# Patient Record
Sex: Female | Born: 1935 | Race: White | Hispanic: No | Marital: Married | Smoking: Never smoker
Health system: Southern US, Community
[De-identification: ages and names within clinical notes are randomized; demographics above are authoritative.]

## PROBLEM LIST (undated history)

## (undated) DIAGNOSIS — M1990 Unspecified osteoarthritis, unspecified site: Secondary | ICD-10-CM

## (undated) DIAGNOSIS — I5032 Chronic diastolic (congestive) heart failure: Secondary | ICD-10-CM

## (undated) DIAGNOSIS — F411 Generalized anxiety disorder: Secondary | ICD-10-CM

## (undated) DIAGNOSIS — C801 Malignant (primary) neoplasm, unspecified: Secondary | ICD-10-CM

## (undated) DIAGNOSIS — I482 Chronic atrial fibrillation, unspecified: Secondary | ICD-10-CM

## (undated) DIAGNOSIS — Z973 Presence of spectacles and contact lenses: Secondary | ICD-10-CM

## (undated) DIAGNOSIS — E785 Hyperlipidemia, unspecified: Secondary | ICD-10-CM

## (undated) DIAGNOSIS — F418 Other specified anxiety disorders: Secondary | ICD-10-CM

## (undated) DIAGNOSIS — E039 Hypothyroidism, unspecified: Secondary | ICD-10-CM

## (undated) DIAGNOSIS — J383 Other diseases of vocal cords: Secondary | ICD-10-CM

## (undated) HISTORY — PX: HIP FRACTURE SURGERY: SHX118

## (undated) HISTORY — DX: Generalized anxiety disorder: F41.1

## (undated) HISTORY — PX: CATARACT EXTRACTION W/ INTRAOCULAR LENS  IMPLANT, BILATERAL: SHX1307

## (undated) HISTORY — PX: FRACTURE SURGERY: SHX138

## (undated) HISTORY — PX: COLONOSCOPY: SHX174

## (undated) HISTORY — PX: MULTIPLE TOOTH EXTRACTIONS: SHX2053

## (undated) HISTORY — DX: Hyperlipidemia, unspecified: E78.5

---

## 2011-02-14 DIAGNOSIS — F3132 Bipolar disorder, current episode depressed, moderate: Secondary | ICD-10-CM | POA: Diagnosis not present

## 2011-02-14 DIAGNOSIS — F320 Major depressive disorder, single episode, mild: Secondary | ICD-10-CM | POA: Diagnosis not present

## 2011-02-14 DIAGNOSIS — R5382 Chronic fatigue, unspecified: Secondary | ICD-10-CM | POA: Diagnosis not present

## 2011-02-14 DIAGNOSIS — F411 Generalized anxiety disorder: Secondary | ICD-10-CM | POA: Diagnosis not present

## 2011-05-13 DIAGNOSIS — R5381 Other malaise: Secondary | ICD-10-CM | POA: Diagnosis not present

## 2011-05-13 DIAGNOSIS — R5383 Other fatigue: Secondary | ICD-10-CM | POA: Diagnosis not present

## 2011-08-30 DIAGNOSIS — R5381 Other malaise: Secondary | ICD-10-CM | POA: Diagnosis not present

## 2011-08-30 DIAGNOSIS — R5383 Other fatigue: Secondary | ICD-10-CM | POA: Diagnosis not present

## 2011-08-30 DIAGNOSIS — R5382 Chronic fatigue, unspecified: Secondary | ICD-10-CM | POA: Diagnosis not present

## 2011-08-30 DIAGNOSIS — E569 Vitamin deficiency, unspecified: Secondary | ICD-10-CM | POA: Diagnosis not present

## 2011-10-20 DIAGNOSIS — Z961 Presence of intraocular lens: Secondary | ICD-10-CM | POA: Diagnosis not present

## 2011-11-15 DIAGNOSIS — J209 Acute bronchitis, unspecified: Secondary | ICD-10-CM | POA: Diagnosis not present

## 2011-11-28 DIAGNOSIS — Z1231 Encounter for screening mammogram for malignant neoplasm of breast: Secondary | ICD-10-CM | POA: Diagnosis not present

## 2012-02-28 DIAGNOSIS — M549 Dorsalgia, unspecified: Secondary | ICD-10-CM | POA: Diagnosis not present

## 2012-02-28 DIAGNOSIS — Z0000 Encounter for general adult medical examination without abnormal findings: Secondary | ICD-10-CM | POA: Diagnosis not present

## 2012-02-28 DIAGNOSIS — Z136 Encounter for screening for cardiovascular disorders: Secondary | ICD-10-CM | POA: Diagnosis not present

## 2012-03-01 DIAGNOSIS — Z23 Encounter for immunization: Secondary | ICD-10-CM | POA: Diagnosis not present

## 2012-04-03 DIAGNOSIS — Z1272 Encounter for screening for malignant neoplasm of vagina: Secondary | ICD-10-CM | POA: Diagnosis not present

## 2012-04-03 DIAGNOSIS — Z124 Encounter for screening for malignant neoplasm of cervix: Secondary | ICD-10-CM | POA: Diagnosis not present

## 2012-04-25 DIAGNOSIS — H16109 Unspecified superficial keratitis, unspecified eye: Secondary | ICD-10-CM | POA: Diagnosis not present

## 2012-05-09 DIAGNOSIS — H16229 Keratoconjunctivitis sicca, not specified as Sjogren's, unspecified eye: Secondary | ICD-10-CM | POA: Diagnosis not present

## 2012-05-30 DIAGNOSIS — M549 Dorsalgia, unspecified: Secondary | ICD-10-CM | POA: Diagnosis not present

## 2012-07-02 DIAGNOSIS — Z961 Presence of intraocular lens: Secondary | ICD-10-CM | POA: Diagnosis not present

## 2012-08-31 DIAGNOSIS — M549 Dorsalgia, unspecified: Secondary | ICD-10-CM | POA: Diagnosis not present

## 2012-11-06 DIAGNOSIS — R5381 Other malaise: Secondary | ICD-10-CM | POA: Diagnosis not present

## 2012-11-06 DIAGNOSIS — E782 Mixed hyperlipidemia: Secondary | ICD-10-CM | POA: Diagnosis not present

## 2012-11-06 DIAGNOSIS — I517 Cardiomegaly: Secondary | ICD-10-CM | POA: Diagnosis not present

## 2012-11-06 DIAGNOSIS — R05 Cough: Secondary | ICD-10-CM | POA: Diagnosis not present

## 2012-11-06 DIAGNOSIS — R059 Cough, unspecified: Secondary | ICD-10-CM | POA: Diagnosis not present

## 2012-11-28 DIAGNOSIS — Z803 Family history of malignant neoplasm of breast: Secondary | ICD-10-CM | POA: Diagnosis not present

## 2012-11-28 DIAGNOSIS — Z1231 Encounter for screening mammogram for malignant neoplasm of breast: Secondary | ICD-10-CM | POA: Diagnosis not present

## 2012-12-17 DIAGNOSIS — M810 Age-related osteoporosis without current pathological fracture: Secondary | ICD-10-CM | POA: Diagnosis not present

## 2013-01-17 DIAGNOSIS — N950 Postmenopausal bleeding: Secondary | ICD-10-CM | POA: Diagnosis not present

## 2013-02-12 DIAGNOSIS — M129 Arthropathy, unspecified: Secondary | ICD-10-CM | POA: Diagnosis not present

## 2013-02-12 DIAGNOSIS — F411 Generalized anxiety disorder: Secondary | ICD-10-CM | POA: Diagnosis not present

## 2013-02-12 DIAGNOSIS — Z79899 Other long term (current) drug therapy: Secondary | ICD-10-CM | POA: Diagnosis not present

## 2013-02-12 DIAGNOSIS — N950 Postmenopausal bleeding: Secondary | ICD-10-CM | POA: Diagnosis not present

## 2013-02-12 DIAGNOSIS — Z803 Family history of malignant neoplasm of breast: Secondary | ICD-10-CM | POA: Diagnosis not present

## 2013-02-12 DIAGNOSIS — N882 Stricture and stenosis of cervix uteri: Secondary | ICD-10-CM | POA: Diagnosis not present

## 2013-02-12 DIAGNOSIS — N857 Hematometra: Secondary | ICD-10-CM | POA: Diagnosis not present

## 2013-02-12 DIAGNOSIS — Z800 Family history of malignant neoplasm of digestive organs: Secondary | ICD-10-CM | POA: Diagnosis not present

## 2013-02-15 DIAGNOSIS — R5381 Other malaise: Secondary | ICD-10-CM | POA: Diagnosis not present

## 2013-02-15 DIAGNOSIS — R5383 Other fatigue: Secondary | ICD-10-CM | POA: Diagnosis not present

## 2013-05-13 DIAGNOSIS — R5381 Other malaise: Secondary | ICD-10-CM | POA: Diagnosis not present

## 2013-05-13 DIAGNOSIS — R5382 Chronic fatigue, unspecified: Secondary | ICD-10-CM | POA: Diagnosis not present

## 2013-05-13 DIAGNOSIS — Z0000 Encounter for general adult medical examination without abnormal findings: Secondary | ICD-10-CM | POA: Diagnosis not present

## 2013-05-13 DIAGNOSIS — R5383 Other fatigue: Secondary | ICD-10-CM | POA: Diagnosis not present

## 2013-05-13 DIAGNOSIS — G9332 Myalgic encephalomyelitis/chronic fatigue syndrome: Secondary | ICD-10-CM | POA: Diagnosis not present

## 2013-05-15 DIAGNOSIS — R5383 Other fatigue: Secondary | ICD-10-CM | POA: Diagnosis not present

## 2013-05-15 DIAGNOSIS — R5381 Other malaise: Secondary | ICD-10-CM | POA: Diagnosis not present

## 2013-06-17 DIAGNOSIS — Z0000 Encounter for general adult medical examination without abnormal findings: Secondary | ICD-10-CM | POA: Diagnosis not present

## 2013-06-18 DIAGNOSIS — Z961 Presence of intraocular lens: Secondary | ICD-10-CM | POA: Diagnosis not present

## 2013-09-17 DIAGNOSIS — N859 Noninflammatory disorder of uterus, unspecified: Secondary | ICD-10-CM | POA: Diagnosis not present

## 2013-09-23 DIAGNOSIS — R109 Unspecified abdominal pain: Secondary | ICD-10-CM | POA: Diagnosis not present

## 2013-09-23 DIAGNOSIS — N859 Noninflammatory disorder of uterus, unspecified: Secondary | ICD-10-CM | POA: Diagnosis not present

## 2013-10-14 DIAGNOSIS — R1030 Lower abdominal pain, unspecified: Secondary | ICD-10-CM | POA: Diagnosis not present

## 2013-10-16 ENCOUNTER — Encounter: Payer: Self-pay | Admitting: Cardiovascular Disease

## 2013-10-16 DIAGNOSIS — N839 Noninflammatory disorder of ovary, fallopian tube and broad ligament, unspecified: Secondary | ICD-10-CM | POA: Diagnosis not present

## 2013-10-16 DIAGNOSIS — Z7982 Long term (current) use of aspirin: Secondary | ICD-10-CM | POA: Diagnosis not present

## 2013-10-16 DIAGNOSIS — F419 Anxiety disorder, unspecified: Secondary | ICD-10-CM | POA: Diagnosis not present

## 2013-10-16 DIAGNOSIS — R112 Nausea with vomiting, unspecified: Secondary | ICD-10-CM | POA: Diagnosis not present

## 2013-10-16 DIAGNOSIS — K5790 Diverticulosis of intestine, part unspecified, without perforation or abscess without bleeding: Secondary | ICD-10-CM | POA: Diagnosis not present

## 2013-10-16 DIAGNOSIS — Z780 Asymptomatic menopausal state: Secondary | ICD-10-CM | POA: Diagnosis not present

## 2013-10-16 DIAGNOSIS — K5732 Diverticulitis of large intestine without perforation or abscess without bleeding: Secondary | ICD-10-CM | POA: Diagnosis not present

## 2013-10-16 DIAGNOSIS — Z79899 Other long term (current) drug therapy: Secondary | ICD-10-CM | POA: Diagnosis not present

## 2013-10-16 DIAGNOSIS — K630 Abscess of intestine: Secondary | ICD-10-CM | POA: Diagnosis not present

## 2013-10-16 DIAGNOSIS — Z87828 Personal history of other (healed) physical injury and trauma: Secondary | ICD-10-CM | POA: Diagnosis not present

## 2013-10-16 DIAGNOSIS — Z800 Family history of malignant neoplasm of digestive organs: Secondary | ICD-10-CM | POA: Diagnosis not present

## 2013-10-16 DIAGNOSIS — K651 Peritoneal abscess: Secondary | ICD-10-CM | POA: Diagnosis not present

## 2013-10-16 DIAGNOSIS — Z803 Family history of malignant neoplasm of breast: Secondary | ICD-10-CM | POA: Diagnosis not present

## 2013-10-16 DIAGNOSIS — R32 Unspecified urinary incontinence: Secondary | ICD-10-CM | POA: Diagnosis not present

## 2013-10-16 DIAGNOSIS — Z23 Encounter for immunization: Secondary | ICD-10-CM | POA: Diagnosis not present

## 2013-10-16 DIAGNOSIS — I517 Cardiomegaly: Secondary | ICD-10-CM | POA: Diagnosis not present

## 2013-10-16 DIAGNOSIS — R1032 Left lower quadrant pain: Secondary | ICD-10-CM | POA: Diagnosis not present

## 2013-10-16 DIAGNOSIS — R109 Unspecified abdominal pain: Secondary | ICD-10-CM | POA: Diagnosis not present

## 2013-10-16 DIAGNOSIS — K769 Liver disease, unspecified: Secondary | ICD-10-CM | POA: Diagnosis not present

## 2013-10-16 DIAGNOSIS — N739 Female pelvic inflammatory disease, unspecified: Secondary | ICD-10-CM | POA: Diagnosis not present

## 2013-10-16 DIAGNOSIS — K5720 Diverticulitis of large intestine with perforation and abscess without bleeding: Secondary | ICD-10-CM | POA: Diagnosis not present

## 2013-10-21 ENCOUNTER — Encounter: Payer: Self-pay | Admitting: Cardiovascular Disease

## 2013-10-22 ENCOUNTER — Encounter: Payer: Self-pay | Admitting: Cardiovascular Disease

## 2013-10-22 DIAGNOSIS — Z7982 Long term (current) use of aspirin: Secondary | ICD-10-CM | POA: Diagnosis not present

## 2013-10-22 DIAGNOSIS — Z23 Encounter for immunization: Secondary | ICD-10-CM | POA: Diagnosis not present

## 2013-10-22 DIAGNOSIS — N739 Female pelvic inflammatory disease, unspecified: Secondary | ICD-10-CM | POA: Diagnosis not present

## 2013-10-22 DIAGNOSIS — K651 Peritoneal abscess: Secondary | ICD-10-CM | POA: Diagnosis not present

## 2013-10-22 DIAGNOSIS — K5780 Diverticulitis of intestine, part unspecified, with perforation and abscess without bleeding: Secondary | ICD-10-CM | POA: Diagnosis not present

## 2013-10-22 DIAGNOSIS — F419 Anxiety disorder, unspecified: Secondary | ICD-10-CM | POA: Diagnosis not present

## 2013-10-22 DIAGNOSIS — R109 Unspecified abdominal pain: Secondary | ICD-10-CM | POA: Diagnosis not present

## 2013-10-22 DIAGNOSIS — R32 Unspecified urinary incontinence: Secondary | ICD-10-CM | POA: Diagnosis not present

## 2013-10-22 DIAGNOSIS — N839 Noninflammatory disorder of ovary, fallopian tube and broad ligament, unspecified: Secondary | ICD-10-CM | POA: Diagnosis not present

## 2013-10-22 DIAGNOSIS — K5720 Diverticulitis of large intestine with perforation and abscess without bleeding: Secondary | ICD-10-CM | POA: Diagnosis not present

## 2013-10-22 DIAGNOSIS — K5732 Diverticulitis of large intestine without perforation or abscess without bleeding: Secondary | ICD-10-CM | POA: Diagnosis not present

## 2013-10-23 DIAGNOSIS — K5720 Diverticulitis of large intestine with perforation and abscess without bleeding: Secondary | ICD-10-CM | POA: Diagnosis not present

## 2013-10-24 DIAGNOSIS — K5780 Diverticulitis of intestine, part unspecified, with perforation and abscess without bleeding: Secondary | ICD-10-CM | POA: Diagnosis not present

## 2013-10-24 DIAGNOSIS — K5720 Diverticulitis of large intestine with perforation and abscess without bleeding: Secondary | ICD-10-CM | POA: Diagnosis not present

## 2013-10-25 DIAGNOSIS — N739 Female pelvic inflammatory disease, unspecified: Secondary | ICD-10-CM | POA: Diagnosis not present

## 2013-10-25 DIAGNOSIS — K5780 Diverticulitis of intestine, part unspecified, with perforation and abscess without bleeding: Secondary | ICD-10-CM | POA: Diagnosis not present

## 2013-11-04 DIAGNOSIS — N732 Unspecified parametritis and pelvic cellulitis: Secondary | ICD-10-CM | POA: Diagnosis not present

## 2013-11-04 DIAGNOSIS — N739 Female pelvic inflammatory disease, unspecified: Secondary | ICD-10-CM | POA: Diagnosis not present

## 2013-11-04 DIAGNOSIS — Z4803 Encounter for change or removal of drains: Secondary | ICD-10-CM | POA: Diagnosis not present

## 2013-11-12 DIAGNOSIS — N950 Postmenopausal bleeding: Secondary | ICD-10-CM | POA: Diagnosis not present

## 2013-11-12 DIAGNOSIS — N732 Unspecified parametritis and pelvic cellulitis: Secondary | ICD-10-CM | POA: Diagnosis not present

## 2013-11-12 DIAGNOSIS — Z79899 Other long term (current) drug therapy: Secondary | ICD-10-CM | POA: Diagnosis not present

## 2013-11-12 DIAGNOSIS — K5720 Diverticulitis of large intestine with perforation and abscess without bleeding: Secondary | ICD-10-CM | POA: Diagnosis not present

## 2013-11-12 DIAGNOSIS — K5780 Diverticulitis of intestine, part unspecified, with perforation and abscess without bleeding: Secondary | ICD-10-CM | POA: Diagnosis not present

## 2013-11-12 DIAGNOSIS — Z7982 Long term (current) use of aspirin: Secondary | ICD-10-CM | POA: Diagnosis not present

## 2013-11-16 ENCOUNTER — Encounter: Payer: Self-pay | Admitting: Cardiovascular Disease

## 2013-11-16 DIAGNOSIS — A480 Gas gangrene: Secondary | ICD-10-CM | POA: Diagnosis not present

## 2013-11-16 DIAGNOSIS — Z800 Family history of malignant neoplasm of digestive organs: Secondary | ICD-10-CM | POA: Diagnosis not present

## 2013-11-16 DIAGNOSIS — K5792 Diverticulitis of intestine, part unspecified, without perforation or abscess without bleeding: Secondary | ICD-10-CM | POA: Diagnosis not present

## 2013-11-16 DIAGNOSIS — K630 Abscess of intestine: Secondary | ICD-10-CM | POA: Diagnosis not present

## 2013-11-16 DIAGNOSIS — N950 Postmenopausal bleeding: Secondary | ICD-10-CM | POA: Diagnosis not present

## 2013-11-16 DIAGNOSIS — Z9989 Dependence on other enabling machines and devices: Secondary | ICD-10-CM | POA: Diagnosis not present

## 2013-11-16 DIAGNOSIS — N732 Unspecified parametritis and pelvic cellulitis: Secondary | ICD-10-CM | POA: Diagnosis not present

## 2013-11-16 DIAGNOSIS — K5780 Diverticulitis of intestine, part unspecified, with perforation and abscess without bleeding: Secondary | ICD-10-CM | POA: Diagnosis not present

## 2013-11-16 DIAGNOSIS — G4710 Hypersomnia, unspecified: Secondary | ICD-10-CM | POA: Diagnosis not present

## 2013-11-16 DIAGNOSIS — Z01818 Encounter for other preprocedural examination: Secondary | ICD-10-CM | POA: Diagnosis not present

## 2013-11-16 DIAGNOSIS — R1032 Left lower quadrant pain: Secondary | ICD-10-CM | POA: Diagnosis not present

## 2013-11-16 DIAGNOSIS — N9489 Other specified conditions associated with female genital organs and menstrual cycle: Secondary | ICD-10-CM | POA: Diagnosis not present

## 2013-11-16 DIAGNOSIS — F419 Anxiety disorder, unspecified: Secondary | ICD-10-CM | POA: Diagnosis not present

## 2013-11-16 DIAGNOSIS — N739 Female pelvic inflammatory disease, unspecified: Secondary | ICD-10-CM | POA: Diagnosis not present

## 2013-11-16 DIAGNOSIS — Z7982 Long term (current) use of aspirin: Secondary | ICD-10-CM | POA: Diagnosis not present

## 2013-11-16 DIAGNOSIS — K6389 Other specified diseases of intestine: Secondary | ICD-10-CM | POA: Diagnosis not present

## 2013-11-16 DIAGNOSIS — I4891 Unspecified atrial fibrillation: Secondary | ICD-10-CM | POA: Diagnosis not present

## 2013-11-16 DIAGNOSIS — R109 Unspecified abdominal pain: Secondary | ICD-10-CM | POA: Diagnosis not present

## 2013-11-16 DIAGNOSIS — C786 Secondary malignant neoplasm of retroperitoneum and peritoneum: Secondary | ICD-10-CM | POA: Diagnosis not present

## 2013-11-16 DIAGNOSIS — C569 Malignant neoplasm of unspecified ovary: Secondary | ICD-10-CM | POA: Diagnosis not present

## 2013-11-16 DIAGNOSIS — C562 Malignant neoplasm of left ovary: Secondary | ICD-10-CM | POA: Diagnosis not present

## 2013-11-16 DIAGNOSIS — K5720 Diverticulitis of large intestine with perforation and abscess without bleeding: Secondary | ICD-10-CM | POA: Diagnosis not present

## 2013-11-16 DIAGNOSIS — M1990 Unspecified osteoarthritis, unspecified site: Secondary | ICD-10-CM | POA: Diagnosis present

## 2013-11-16 DIAGNOSIS — R279 Unspecified lack of coordination: Secondary | ICD-10-CM | POA: Diagnosis not present

## 2013-11-16 DIAGNOSIS — Z79899 Other long term (current) drug therapy: Secondary | ICD-10-CM | POA: Diagnosis not present

## 2013-11-16 DIAGNOSIS — Z803 Family history of malignant neoplasm of breast: Secondary | ICD-10-CM | POA: Diagnosis not present

## 2013-11-16 DIAGNOSIS — K668 Other specified disorders of peritoneum: Secondary | ICD-10-CM | POA: Diagnosis not present

## 2013-12-03 DIAGNOSIS — I10 Essential (primary) hypertension: Secondary | ICD-10-CM | POA: Diagnosis not present

## 2013-12-03 DIAGNOSIS — C569 Malignant neoplasm of unspecified ovary: Secondary | ICD-10-CM | POA: Diagnosis not present

## 2013-12-03 DIAGNOSIS — I4891 Unspecified atrial fibrillation: Secondary | ICD-10-CM | POA: Diagnosis not present

## 2013-12-03 DIAGNOSIS — N135 Crossing vessel and stricture of ureter without hydronephrosis: Secondary | ICD-10-CM | POA: Diagnosis not present

## 2013-12-03 DIAGNOSIS — R262 Difficulty in walking, not elsewhere classified: Secondary | ICD-10-CM | POA: Diagnosis not present

## 2013-12-03 DIAGNOSIS — R531 Weakness: Secondary | ICD-10-CM | POA: Diagnosis not present

## 2013-12-03 DIAGNOSIS — I481 Persistent atrial fibrillation: Secondary | ICD-10-CM | POA: Diagnosis not present

## 2013-12-03 DIAGNOSIS — Z7401 Bed confinement status: Secondary | ICD-10-CM | POA: Diagnosis not present

## 2013-12-03 DIAGNOSIS — A047 Enterocolitis due to Clostridium difficile: Secondary | ICD-10-CM | POA: Diagnosis not present

## 2013-12-03 DIAGNOSIS — K660 Peritoneal adhesions (postprocedural) (postinfection): Secondary | ICD-10-CM | POA: Diagnosis present

## 2013-12-03 DIAGNOSIS — D62 Acute posthemorrhagic anemia: Secondary | ICD-10-CM | POA: Diagnosis not present

## 2013-12-03 DIAGNOSIS — Z48815 Encounter for surgical aftercare following surgery on the digestive system: Secondary | ICD-10-CM | POA: Diagnosis not present

## 2013-12-03 DIAGNOSIS — C561 Malignant neoplasm of right ovary: Secondary | ICD-10-CM | POA: Diagnosis not present

## 2013-12-03 DIAGNOSIS — R5381 Other malaise: Secondary | ICD-10-CM | POA: Diagnosis not present

## 2013-12-03 DIAGNOSIS — M1990 Unspecified osteoarthritis, unspecified site: Secondary | ICD-10-CM | POA: Diagnosis present

## 2013-12-03 DIAGNOSIS — F419 Anxiety disorder, unspecified: Secondary | ICD-10-CM | POA: Diagnosis present

## 2013-12-03 DIAGNOSIS — R918 Other nonspecific abnormal finding of lung field: Secondary | ICD-10-CM | POA: Diagnosis not present

## 2013-12-03 DIAGNOSIS — R1900 Intra-abdominal and pelvic swelling, mass and lump, unspecified site: Secondary | ICD-10-CM | POA: Diagnosis not present

## 2013-12-03 DIAGNOSIS — N738 Other specified female pelvic inflammatory diseases: Secondary | ICD-10-CM | POA: Diagnosis not present

## 2013-12-03 DIAGNOSIS — G8918 Other acute postprocedural pain: Secondary | ICD-10-CM | POA: Diagnosis not present

## 2013-12-03 DIAGNOSIS — K649 Unspecified hemorrhoids: Secondary | ICD-10-CM | POA: Diagnosis not present

## 2013-12-03 DIAGNOSIS — Z48816 Encounter for surgical aftercare following surgery on the genitourinary system: Secondary | ICD-10-CM | POA: Diagnosis not present

## 2013-12-03 DIAGNOSIS — C763 Malignant neoplasm of pelvis: Secondary | ICD-10-CM | POA: Diagnosis present

## 2013-12-03 DIAGNOSIS — K5720 Diverticulitis of large intestine with perforation and abscess without bleeding: Secondary | ICD-10-CM | POA: Diagnosis not present

## 2013-12-03 DIAGNOSIS — M6281 Muscle weakness (generalized): Secondary | ICD-10-CM | POA: Diagnosis not present

## 2013-12-03 DIAGNOSIS — C5701 Malignant neoplasm of right fallopian tube: Secondary | ICD-10-CM | POA: Diagnosis not present

## 2013-12-03 DIAGNOSIS — C562 Malignant neoplasm of left ovary: Secondary | ICD-10-CM | POA: Diagnosis not present

## 2013-12-03 DIAGNOSIS — Z9889 Other specified postprocedural states: Secondary | ICD-10-CM | POA: Diagnosis not present

## 2013-12-03 DIAGNOSIS — C786 Secondary malignant neoplasm of retroperitoneum and peritoneum: Secondary | ICD-10-CM | POA: Diagnosis not present

## 2013-12-03 DIAGNOSIS — K5900 Constipation, unspecified: Secondary | ICD-10-CM | POA: Diagnosis not present

## 2013-12-03 DIAGNOSIS — I272 Other secondary pulmonary hypertension: Secondary | ICD-10-CM | POA: Diagnosis present

## 2013-12-03 DIAGNOSIS — R109 Unspecified abdominal pain: Secondary | ICD-10-CM | POA: Diagnosis not present

## 2013-12-03 DIAGNOSIS — Z483 Aftercare following surgery for neoplasm: Secondary | ICD-10-CM | POA: Diagnosis not present

## 2013-12-03 HISTORY — PX: TOTAL ABDOMINAL HYSTERECTOMY: SHX209

## 2013-12-03 HISTORY — PX: COLECTOMY: SHX59

## 2013-12-14 DIAGNOSIS — R109 Unspecified abdominal pain: Secondary | ICD-10-CM | POA: Diagnosis not present

## 2013-12-14 DIAGNOSIS — L299 Pruritus, unspecified: Secondary | ICD-10-CM | POA: Diagnosis not present

## 2013-12-14 DIAGNOSIS — K5901 Slow transit constipation: Secondary | ICD-10-CM | POA: Diagnosis not present

## 2013-12-14 DIAGNOSIS — Z48816 Encounter for surgical aftercare following surgery on the genitourinary system: Secondary | ICD-10-CM | POA: Diagnosis not present

## 2013-12-14 DIAGNOSIS — M1990 Unspecified osteoarthritis, unspecified site: Secondary | ICD-10-CM | POA: Diagnosis not present

## 2013-12-14 DIAGNOSIS — Z7401 Bed confinement status: Secondary | ICD-10-CM | POA: Diagnosis not present

## 2013-12-14 DIAGNOSIS — Z90722 Acquired absence of ovaries, bilateral: Secondary | ICD-10-CM | POA: Diagnosis not present

## 2013-12-14 DIAGNOSIS — M6281 Muscle weakness (generalized): Secondary | ICD-10-CM | POA: Diagnosis not present

## 2013-12-14 DIAGNOSIS — D010 Carcinoma in situ of colon: Secondary | ICD-10-CM | POA: Diagnosis not present

## 2013-12-14 DIAGNOSIS — R21 Rash and other nonspecific skin eruption: Secondary | ICD-10-CM | POA: Diagnosis not present

## 2013-12-14 DIAGNOSIS — N738 Other specified female pelvic inflammatory diseases: Secondary | ICD-10-CM | POA: Diagnosis not present

## 2013-12-14 DIAGNOSIS — Z08 Encounter for follow-up examination after completed treatment for malignant neoplasm: Secondary | ICD-10-CM | POA: Diagnosis not present

## 2013-12-14 DIAGNOSIS — Z90710 Acquired absence of both cervix and uterus: Secondary | ICD-10-CM | POA: Diagnosis not present

## 2013-12-14 DIAGNOSIS — K5900 Constipation, unspecified: Secondary | ICD-10-CM | POA: Diagnosis not present

## 2013-12-14 DIAGNOSIS — A047 Enterocolitis due to Clostridium difficile: Secondary | ICD-10-CM | POA: Diagnosis not present

## 2013-12-14 DIAGNOSIS — R1032 Left lower quadrant pain: Secondary | ICD-10-CM | POA: Diagnosis not present

## 2013-12-14 DIAGNOSIS — K5720 Diverticulitis of large intestine with perforation and abscess without bleeding: Secondary | ICD-10-CM | POA: Diagnosis not present

## 2013-12-14 DIAGNOSIS — R531 Weakness: Secondary | ICD-10-CM | POA: Diagnosis not present

## 2013-12-14 DIAGNOSIS — Z483 Aftercare following surgery for neoplasm: Secondary | ICD-10-CM | POA: Diagnosis not present

## 2013-12-14 DIAGNOSIS — F419 Anxiety disorder, unspecified: Secondary | ICD-10-CM | POA: Diagnosis not present

## 2013-12-14 DIAGNOSIS — F411 Generalized anxiety disorder: Secondary | ICD-10-CM | POA: Diagnosis not present

## 2013-12-14 DIAGNOSIS — R262 Difficulty in walking, not elsewhere classified: Secondary | ICD-10-CM | POA: Diagnosis not present

## 2013-12-14 DIAGNOSIS — R5381 Other malaise: Secondary | ICD-10-CM | POA: Diagnosis not present

## 2013-12-14 DIAGNOSIS — I10 Essential (primary) hypertension: Secondary | ICD-10-CM | POA: Diagnosis not present

## 2013-12-14 DIAGNOSIS — Z48815 Encounter for surgical aftercare following surgery on the digestive system: Secondary | ICD-10-CM | POA: Diagnosis not present

## 2013-12-14 DIAGNOSIS — K649 Unspecified hemorrhoids: Secondary | ICD-10-CM | POA: Diagnosis not present

## 2013-12-14 DIAGNOSIS — C5701 Malignant neoplasm of right fallopian tube: Secondary | ICD-10-CM | POA: Diagnosis not present

## 2013-12-14 DIAGNOSIS — C19 Malignant neoplasm of rectosigmoid junction: Secondary | ICD-10-CM | POA: Diagnosis not present

## 2013-12-14 DIAGNOSIS — I4891 Unspecified atrial fibrillation: Secondary | ICD-10-CM | POA: Diagnosis not present

## 2013-12-14 DIAGNOSIS — C763 Malignant neoplasm of pelvis: Secondary | ICD-10-CM | POA: Diagnosis not present

## 2013-12-14 DIAGNOSIS — R1031 Right lower quadrant pain: Secondary | ICD-10-CM | POA: Diagnosis not present

## 2013-12-14 DIAGNOSIS — L853 Xerosis cutis: Secondary | ICD-10-CM | POA: Diagnosis not present

## 2013-12-17 DIAGNOSIS — I10 Essential (primary) hypertension: Secondary | ICD-10-CM | POA: Diagnosis not present

## 2013-12-17 DIAGNOSIS — A047 Enterocolitis due to Clostridium difficile: Secondary | ICD-10-CM | POA: Diagnosis not present

## 2013-12-17 DIAGNOSIS — I4891 Unspecified atrial fibrillation: Secondary | ICD-10-CM | POA: Diagnosis not present

## 2013-12-17 DIAGNOSIS — C763 Malignant neoplasm of pelvis: Secondary | ICD-10-CM | POA: Diagnosis not present

## 2013-12-18 DIAGNOSIS — C763 Malignant neoplasm of pelvis: Secondary | ICD-10-CM | POA: Diagnosis not present

## 2013-12-18 DIAGNOSIS — I10 Essential (primary) hypertension: Secondary | ICD-10-CM | POA: Diagnosis not present

## 2013-12-18 DIAGNOSIS — R5381 Other malaise: Secondary | ICD-10-CM | POA: Diagnosis not present

## 2013-12-18 DIAGNOSIS — I4891 Unspecified atrial fibrillation: Secondary | ICD-10-CM | POA: Diagnosis not present

## 2013-12-20 DIAGNOSIS — C763 Malignant neoplasm of pelvis: Secondary | ICD-10-CM | POA: Diagnosis not present

## 2013-12-20 DIAGNOSIS — K649 Unspecified hemorrhoids: Secondary | ICD-10-CM | POA: Diagnosis not present

## 2013-12-20 DIAGNOSIS — K5900 Constipation, unspecified: Secondary | ICD-10-CM | POA: Diagnosis not present

## 2013-12-20 DIAGNOSIS — F411 Generalized anxiety disorder: Secondary | ICD-10-CM | POA: Diagnosis not present

## 2013-12-25 DIAGNOSIS — R1032 Left lower quadrant pain: Secondary | ICD-10-CM | POA: Diagnosis not present

## 2013-12-25 DIAGNOSIS — C763 Malignant neoplasm of pelvis: Secondary | ICD-10-CM | POA: Diagnosis not present

## 2013-12-25 DIAGNOSIS — K5901 Slow transit constipation: Secondary | ICD-10-CM | POA: Diagnosis not present

## 2013-12-25 DIAGNOSIS — R1031 Right lower quadrant pain: Secondary | ICD-10-CM | POA: Diagnosis not present

## 2014-01-01 DIAGNOSIS — R21 Rash and other nonspecific skin eruption: Secondary | ICD-10-CM | POA: Diagnosis not present

## 2014-01-01 DIAGNOSIS — L853 Xerosis cutis: Secondary | ICD-10-CM | POA: Diagnosis not present

## 2014-01-01 DIAGNOSIS — L299 Pruritus, unspecified: Secondary | ICD-10-CM | POA: Diagnosis not present

## 2014-01-06 DIAGNOSIS — C5701 Malignant neoplasm of right fallopian tube: Secondary | ICD-10-CM | POA: Diagnosis not present

## 2014-01-08 DIAGNOSIS — I4891 Unspecified atrial fibrillation: Secondary | ICD-10-CM | POA: Diagnosis not present

## 2014-01-08 DIAGNOSIS — R5381 Other malaise: Secondary | ICD-10-CM | POA: Diagnosis not present

## 2014-01-08 DIAGNOSIS — C763 Malignant neoplasm of pelvis: Secondary | ICD-10-CM | POA: Diagnosis not present

## 2014-01-08 DIAGNOSIS — I10 Essential (primary) hypertension: Secondary | ICD-10-CM | POA: Diagnosis not present

## 2014-01-13 DIAGNOSIS — C5701 Malignant neoplasm of right fallopian tube: Secondary | ICD-10-CM | POA: Diagnosis not present

## 2014-01-13 DIAGNOSIS — R682 Dry mouth, unspecified: Secondary | ICD-10-CM | POA: Diagnosis not present

## 2014-01-13 DIAGNOSIS — R531 Weakness: Secondary | ICD-10-CM | POA: Diagnosis not present

## 2014-01-13 DIAGNOSIS — I482 Chronic atrial fibrillation: Secondary | ICD-10-CM | POA: Diagnosis not present

## 2014-01-13 DIAGNOSIS — A047 Enterocolitis due to Clostridium difficile: Secondary | ICD-10-CM | POA: Diagnosis not present

## 2014-01-15 ENCOUNTER — Encounter: Payer: Self-pay | Admitting: Cardiovascular Disease

## 2014-01-15 DIAGNOSIS — C5700 Malignant neoplasm of unspecified fallopian tube: Secondary | ICD-10-CM | POA: Diagnosis not present

## 2014-01-15 DIAGNOSIS — M1990 Unspecified osteoarthritis, unspecified site: Secondary | ICD-10-CM | POA: Diagnosis not present

## 2014-01-15 DIAGNOSIS — F419 Anxiety disorder, unspecified: Secondary | ICD-10-CM | POA: Diagnosis not present

## 2014-01-15 DIAGNOSIS — Z452 Encounter for adjustment and management of vascular access device: Secondary | ICD-10-CM | POA: Diagnosis not present

## 2014-01-20 ENCOUNTER — Encounter: Payer: Self-pay | Admitting: Cardiovascular Disease

## 2014-01-20 DIAGNOSIS — R112 Nausea with vomiting, unspecified: Secondary | ICD-10-CM | POA: Diagnosis not present

## 2014-01-20 DIAGNOSIS — C5701 Malignant neoplasm of right fallopian tube: Secondary | ICD-10-CM | POA: Diagnosis not present

## 2014-01-20 DIAGNOSIS — C5700 Malignant neoplasm of unspecified fallopian tube: Secondary | ICD-10-CM | POA: Diagnosis not present

## 2014-01-21 DIAGNOSIS — C5701 Malignant neoplasm of right fallopian tube: Secondary | ICD-10-CM | POA: Diagnosis not present

## 2014-01-21 DIAGNOSIS — D701 Agranulocytosis secondary to cancer chemotherapy: Secondary | ICD-10-CM | POA: Diagnosis not present

## 2014-01-23 ENCOUNTER — Encounter: Payer: Self-pay | Admitting: *Deleted

## 2014-01-23 ENCOUNTER — Ambulatory Visit (INDEPENDENT_AMBULATORY_CARE_PROVIDER_SITE_OTHER): Payer: Medicare Other | Admitting: Cardiovascular Disease

## 2014-01-23 ENCOUNTER — Other Ambulatory Visit: Payer: Self-pay | Admitting: *Deleted

## 2014-01-23 VITALS — BP 143/90 | HR 97 | Ht 65.0 in | Wt 136.0 lb

## 2014-01-23 DIAGNOSIS — I482 Chronic atrial fibrillation, unspecified: Secondary | ICD-10-CM

## 2014-01-23 DIAGNOSIS — C5700 Malignant neoplasm of unspecified fallopian tube: Secondary | ICD-10-CM

## 2014-01-23 DIAGNOSIS — I4891 Unspecified atrial fibrillation: Secondary | ICD-10-CM | POA: Insufficient documentation

## 2014-01-23 DIAGNOSIS — I10 Essential (primary) hypertension: Secondary | ICD-10-CM | POA: Diagnosis not present

## 2014-01-23 NOTE — Progress Notes (Signed)
Patient ID: Tracy Bowman, female   DOB: 1927-03-30, 81 y.o.   MRN: [id number]       CARDIOLOGY CONSULT NOTE  Patient ID: Tracy Bowman MRN: [id number] DOB/AGE: 1927-12-02 81 y.o.  Admit date: (Not on file) Primary Physician Neale Burly, MD  Reason for Consultation: atrial fibrillation  HPI: The patient is an 81 year old woman whose husband, Francee Piccolo, is a patient of mine. She has a history of colonic diverticulitis which required surgery for abscess at [facility]. She also has atrial fibrillation and apparently had been taking metoprolol and Xarelto, but not any longer as per her husband. An echocardiogram performed at [facility] on 11/22/2013 demonstrated normal left ventricular systolic function, EF 15-40%, mild biatrial dilatation, indeterminate diastolic function, mild mitral regurgitation, and moderate tricuspid regurgitation. There was a trivial pericardial effusion noted. 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 She very seldom has palpitations and denies chest pain altogether. Her primary complaint is fatigue. Her abdominal wound is healing well.    Allergies no known allergies  Current Outpatient Prescriptions  Medication Sig Dispense Refill  . acetaminophen (TYLENOL) 325 MG tablet Take 650 mg by mouth every 6 (six) hours as needed.    . ALPRAZolam (XANAX) 0.25 MG tablet Take 1 tablet by mouth 2 (two) times daily as needed.  0  . Cholecalciferol (VITAMIN D3) 2000 UNITS TABS Take 1 tablet by mouth daily.    Marland Kitchen docusate sodium (COLACE) 100 MG capsule Take 100 mg by mouth 2 (two) times daily as needed.    Marland Kitchen HYDROcodone-acetaminophen (NORCO/VICODIN) 5-325 MG per tablet Take 1 tablet by mouth every 6 (six) hours as needed.  0  . metoprolol succinate (TOPROL-XL) 25 MG 24 hr tablet Take 25 mg by mouth daily.    . Multiple Vitamins tablet  Take 1 tablet by mouth daily.    . Omega-3 Fatty Acids (FISH OIL) 1000 MG CAPS Take 1 capsule by mouth 2 (two) times daily.    . rivaroxaban (XARELTO) 20 MG TABS tablet Take 20 mg by mouth daily.    . traMADol (ULTRAM) 50 MG tablet Take 1 tablet by mouth every 8 (eight) hours as needed.  0   No current facility-administered medications for this visit.    Past Medical History  Diagnosis Date  . Hyperlipidemia   . Anxiety neurosis     No past surgical history on file.  History   Social History  . Marital Status: Married    Spouse Name: N/A    Number of Children: N/A  . Years of Education: N/A   Occupational History  . Not on file.   Social History Main Topics  . Smoking status: Never Smoker   . Smokeless tobacco: Never Used  . Alcohol Use: Not on file  . Drug Use: Not on file  . Sexual Activity: Not on file   Other Topics Concern  . Not on file   Social History Narrative  . No narrative on file     No family history of premature CAD in 1st degree relatives.  Prior to Admission medications   Medication Sig Start Date End Date Taking? Authorizing Provider  acetaminophen (TYLENOL) 325 MG tablet Take 650 mg by mouth every 6 (six) hours as needed.   Yes Historical Provider, MD  ALPRAZolam Duanne Moron) 0.25 MG tablet Take 1 tablet by mouth 2 (two) times daily as needed. 01/19/14  Yes Historical Provider, MD  Cholecalciferol (VITAMIN D3) 2000 UNITS TABS Take 1 tablet by mouth daily.   Yes Historical Provider, MD  docusate sodium (COLACE) 100 MG capsule Take 100 mg by mouth 2 (two) times daily as needed.   Yes Historical Provider, MD  HYDROcodone-acetaminophen (NORCO/VICODIN) 5-325 MG per tablet Take 1 tablet by mouth every 6 (six) hours as needed. 01/16/14  Yes Historical Provider, MD  metoprolol succinate (TOPROL-XL) 25 MG 24 hr tablet Take 25 mg by mouth daily.   Yes Historical Provider, MD  Multiple Vitamins tablet Take 1 tablet by mouth daily.   Yes Historical Provider, MD    Omega-3 Fatty Acids (FISH OIL) 1000 MG CAPS Take 1 capsule by mouth 2 (two) times daily.   Yes Historical Provider, MD  rivaroxaban (XARELTO) 20 MG TABS tablet Take 20 mg by mouth daily.   Yes Historical Provider, MD  traMADol (ULTRAM) 50 MG tablet Take 1 tablet by mouth every 8 (eight) hours as needed. 01/19/14  Yes Historical Provider, MD     Review of systems complete and found to be negative unless listed above in HPI     Physical exam Blood pressure 143/90, pulse 97, height 5\' 5"  (1.651 m), weight 136 lb (61.689 kg).   HR by auscultation: 76 bpm at rest  General: NAD Neck: No JVD, no thyromegaly or thyroid nodule.  Lungs: Clear to auscultation bilaterally with normal respiratory effort. CV: Irregular rhythm, normal normal rate, normal S1/S2, no S3, no murmur.  No peripheral edema.    Abdomen: Packed abdominal wound, no oozing/discharge. Neurologic: Alert and oriented x 3.  Psych: Normal affect. Extremities: No clubbing or cyanosis.  HEENT: Normal.   ECG: Most recent ECG reviewed.  Labs:  No results found for: WBC, HGB, HCT, MCV, PLT No results for input(s): NA, K, CL, CO2, BUN, CREATININE, CALCIUM, PROT, BILITOT, ALKPHOS, ALT, AST, GLUCOSE in the last 168 hours.  Invalid input(s): LABALBU No results found for: CKTOTAL, CKMB, CKMBINDEX, TROPONINI No results found for: CHOL No results found for: HDL No results found for: LDLCALC No results found for: TRIG No results found for: CHOLHDL No results found for: LDLDIRECT       Studies: No results found.  ASSESSMENT AND PLAN:  1. Atrial fibrillation: This appears to be chronic. She has mild left atrial enlargement. Her HR is currently controlled without AV nodal blocking agents. While her CHADSVASC score is sufficiently high enough to warrant anticoagulation therapy, her husband was told not to have her use a blood thinner recently due to port placement for chemotherapy. I will hold off on reinstituting either metoprolol  or Xarelto for the time being, and try to determine when it would be safe to anticoagulate her.   2. Essential HTN: Mildly elevated. Will monitor. No addition to current meds.  Dispo: f/u 6 months.   Signed: Kate Sable, M.D., F.A.C.C.  01/23/2014, 1:43 PM

## 2014-01-23 NOTE — Patient Instructions (Signed)
Your physician recommends that you schedule a follow-up appointment in: 6 months with Dr. Bronson Ing  We will contact Dr. Sherrie Sport regarding cardiac medication  Thank you for choosing [facility]!!

## 2014-01-27 DIAGNOSIS — R5383 Other fatigue: Secondary | ICD-10-CM | POA: Diagnosis not present

## 2014-01-27 DIAGNOSIS — I5032 Chronic diastolic (congestive) heart failure: Secondary | ICD-10-CM | POA: Diagnosis not present

## 2014-01-27 DIAGNOSIS — I482 Chronic atrial fibrillation: Secondary | ICD-10-CM | POA: Diagnosis not present

## 2014-01-30 ENCOUNTER — Telehealth: Payer: Self-pay | Admitting: *Deleted

## 2014-01-30 NOTE — Telephone Encounter (Signed)
Records received from Dr. Amanda Pea 01/27/14 office visit. On Dr. Court Joy desk

## 2014-02-03 DIAGNOSIS — C5701 Malignant neoplasm of right fallopian tube: Secondary | ICD-10-CM | POA: Diagnosis not present

## 2014-02-03 DIAGNOSIS — R531 Weakness: Secondary | ICD-10-CM | POA: Diagnosis not present

## 2014-02-05 DIAGNOSIS — C5701 Malignant neoplasm of right fallopian tube: Secondary | ICD-10-CM | POA: Diagnosis not present

## 2014-02-10 DIAGNOSIS — R112 Nausea with vomiting, unspecified: Secondary | ICD-10-CM | POA: Diagnosis not present

## 2014-02-10 DIAGNOSIS — C5701 Malignant neoplasm of right fallopian tube: Secondary | ICD-10-CM | POA: Diagnosis not present

## 2014-02-10 DIAGNOSIS — D701 Agranulocytosis secondary to cancer chemotherapy: Secondary | ICD-10-CM | POA: Diagnosis not present

## 2014-02-11 DIAGNOSIS — C5701 Malignant neoplasm of right fallopian tube: Secondary | ICD-10-CM | POA: Diagnosis not present

## 2014-02-11 DIAGNOSIS — D701 Agranulocytosis secondary to cancer chemotherapy: Secondary | ICD-10-CM | POA: Diagnosis not present

## 2014-02-20 DIAGNOSIS — R5383 Other fatigue: Secondary | ICD-10-CM | POA: Diagnosis not present

## 2014-02-24 DIAGNOSIS — C5701 Malignant neoplasm of right fallopian tube: Secondary | ICD-10-CM | POA: Diagnosis not present

## 2014-02-24 DIAGNOSIS — R531 Weakness: Secondary | ICD-10-CM | POA: Diagnosis not present

## 2014-02-24 DIAGNOSIS — G629 Polyneuropathy, unspecified: Secondary | ICD-10-CM | POA: Diagnosis not present

## 2014-02-24 DIAGNOSIS — I482 Chronic atrial fibrillation: Secondary | ICD-10-CM | POA: Diagnosis not present

## 2014-02-24 DIAGNOSIS — D701 Agranulocytosis secondary to cancer chemotherapy: Secondary | ICD-10-CM | POA: Diagnosis not present

## 2014-03-03 DIAGNOSIS — C5701 Malignant neoplasm of right fallopian tube: Secondary | ICD-10-CM | POA: Diagnosis not present

## 2014-03-03 DIAGNOSIS — D701 Agranulocytosis secondary to cancer chemotherapy: Secondary | ICD-10-CM | POA: Diagnosis not present

## 2014-03-03 DIAGNOSIS — R112 Nausea with vomiting, unspecified: Secondary | ICD-10-CM | POA: Diagnosis not present

## 2014-03-04 DIAGNOSIS — D701 Agranulocytosis secondary to cancer chemotherapy: Secondary | ICD-10-CM | POA: Diagnosis not present

## 2014-03-04 DIAGNOSIS — C5701 Malignant neoplasm of right fallopian tube: Secondary | ICD-10-CM | POA: Diagnosis not present

## 2014-03-24 DIAGNOSIS — R112 Nausea with vomiting, unspecified: Secondary | ICD-10-CM | POA: Diagnosis not present

## 2014-03-24 DIAGNOSIS — C5700 Malignant neoplasm of unspecified fallopian tube: Secondary | ICD-10-CM | POA: Diagnosis not present

## 2014-03-24 DIAGNOSIS — D701 Agranulocytosis secondary to cancer chemotherapy: Secondary | ICD-10-CM | POA: Diagnosis not present

## 2014-03-24 DIAGNOSIS — C5701 Malignant neoplasm of right fallopian tube: Secondary | ICD-10-CM | POA: Diagnosis not present

## 2014-03-24 DIAGNOSIS — R531 Weakness: Secondary | ICD-10-CM | POA: Diagnosis not present

## 2014-03-24 DIAGNOSIS — Z5111 Encounter for antineoplastic chemotherapy: Secondary | ICD-10-CM | POA: Diagnosis not present

## 2014-03-24 DIAGNOSIS — G629 Polyneuropathy, unspecified: Secondary | ICD-10-CM | POA: Diagnosis not present

## 2014-03-25 DIAGNOSIS — D701 Agranulocytosis secondary to cancer chemotherapy: Secondary | ICD-10-CM | POA: Diagnosis not present

## 2014-03-25 DIAGNOSIS — C5701 Malignant neoplasm of right fallopian tube: Secondary | ICD-10-CM | POA: Diagnosis not present

## 2014-04-14 DIAGNOSIS — D701 Agranulocytosis secondary to cancer chemotherapy: Secondary | ICD-10-CM | POA: Diagnosis not present

## 2014-04-14 DIAGNOSIS — R531 Weakness: Secondary | ICD-10-CM | POA: Diagnosis not present

## 2014-04-14 DIAGNOSIS — C5700 Malignant neoplasm of unspecified fallopian tube: Secondary | ICD-10-CM | POA: Diagnosis not present

## 2014-04-14 DIAGNOSIS — M7989 Other specified soft tissue disorders: Secondary | ICD-10-CM | POA: Diagnosis not present

## 2014-04-14 DIAGNOSIS — R0689 Other abnormalities of breathing: Secondary | ICD-10-CM | POA: Diagnosis not present

## 2014-04-14 DIAGNOSIS — R112 Nausea with vomiting, unspecified: Secondary | ICD-10-CM | POA: Diagnosis not present

## 2014-04-14 DIAGNOSIS — C5701 Malignant neoplasm of right fallopian tube: Secondary | ICD-10-CM | POA: Diagnosis not present

## 2014-04-14 DIAGNOSIS — I482 Chronic atrial fibrillation: Secondary | ICD-10-CM | POA: Diagnosis not present

## 2014-04-14 DIAGNOSIS — G629 Polyneuropathy, unspecified: Secondary | ICD-10-CM | POA: Diagnosis not present

## 2014-04-14 DIAGNOSIS — Z5111 Encounter for antineoplastic chemotherapy: Secondary | ICD-10-CM | POA: Diagnosis not present

## 2014-04-15 DIAGNOSIS — M7989 Other specified soft tissue disorders: Secondary | ICD-10-CM | POA: Diagnosis not present

## 2014-04-15 DIAGNOSIS — R0689 Other abnormalities of breathing: Secondary | ICD-10-CM | POA: Diagnosis not present

## 2014-04-15 DIAGNOSIS — C5701 Malignant neoplasm of right fallopian tube: Secondary | ICD-10-CM | POA: Diagnosis not present

## 2014-04-15 DIAGNOSIS — D701 Agranulocytosis secondary to cancer chemotherapy: Secondary | ICD-10-CM | POA: Diagnosis not present

## 2014-04-16 DIAGNOSIS — R0689 Other abnormalities of breathing: Secondary | ICD-10-CM | POA: Diagnosis not present

## 2014-04-16 DIAGNOSIS — R938 Abnormal findings on diagnostic imaging of other specified body structures: Secondary | ICD-10-CM | POA: Diagnosis not present

## 2014-04-16 DIAGNOSIS — I517 Cardiomegaly: Secondary | ICD-10-CM | POA: Diagnosis not present

## 2014-04-16 DIAGNOSIS — R931 Abnormal findings on diagnostic imaging of heart and coronary circulation: Secondary | ICD-10-CM | POA: Diagnosis not present

## 2014-04-16 DIAGNOSIS — R0989 Other specified symptoms and signs involving the circulatory and respiratory systems: Secondary | ICD-10-CM | POA: Diagnosis not present

## 2014-04-16 DIAGNOSIS — I340 Nonrheumatic mitral (valve) insufficiency: Secondary | ICD-10-CM | POA: Diagnosis not present

## 2014-04-16 DIAGNOSIS — M7989 Other specified soft tissue disorders: Secondary | ICD-10-CM | POA: Diagnosis not present

## 2014-04-16 DIAGNOSIS — I482 Chronic atrial fibrillation: Secondary | ICD-10-CM | POA: Diagnosis not present

## 2014-04-16 DIAGNOSIS — R600 Localized edema: Secondary | ICD-10-CM | POA: Diagnosis not present

## 2014-04-16 DIAGNOSIS — I071 Rheumatic tricuspid insufficiency: Secondary | ICD-10-CM | POA: Diagnosis not present

## 2014-04-28 ENCOUNTER — Ambulatory Visit (INDEPENDENT_AMBULATORY_CARE_PROVIDER_SITE_OTHER): Payer: Medicare Other | Admitting: Cardiovascular Disease

## 2014-04-28 ENCOUNTER — Encounter: Payer: Self-pay | Admitting: Cardiovascular Disease

## 2014-04-28 VITALS — BP 117/78 | HR 75 | Ht 65.0 in | Wt 138.0 lb

## 2014-04-28 DIAGNOSIS — I482 Chronic atrial fibrillation, unspecified: Secondary | ICD-10-CM

## 2014-04-28 DIAGNOSIS — I83893 Varicose veins of bilateral lower extremities with other complications: Secondary | ICD-10-CM

## 2014-04-28 DIAGNOSIS — I10 Essential (primary) hypertension: Secondary | ICD-10-CM

## 2014-04-28 DIAGNOSIS — C5700 Malignant neoplasm of unspecified fallopian tube: Secondary | ICD-10-CM

## 2014-04-28 DIAGNOSIS — Z7189 Other specified counseling: Secondary | ICD-10-CM

## 2014-04-28 MED ORDER — RIVAROXABAN 15 MG PO TABS: 15.0000 mg | ORAL_TABLET | Freq: Every day | ORAL | Status: DC

## 2014-04-28 NOTE — Progress Notes (Addendum)
Patient ID: Tracy Bowman, female   DOB: 1927-11-26, 81 y.o.   MRN: [id number]      SUBJECTIVE: The patient presents for follow up. She is an 81 year old woman whose husband, Francee Piccolo, is a patient of mine. She has a history of colonic diverticulitis which required surgery for abscess at [facility]. She also has atrial fibrillation and apparently had been taking metoprolol and Xarelto, but not any longer as per her husband. An echocardiogram performed at [facility] on 11/22/2013 demonstrated normal left ventricular systolic function, EF 96-78%, mild biatrial dilatation, indeterminate diastolic function, mild mitral regurgitation, and moderate tricuspid regurgitation. There was a trivial pericardial effusion noted. 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  She very seldom has palpitations and denies chest pain altogether.   She saw her oncologist on 4/11 and was complaining of bilateral leg swelling. Rales were reportedly auscultated at both lung bases at that time. She was prescribed Lasix 10 mg every other day and an echocardiogram and chest x-ray were ordered.  Echocardiogram was performed at [facility] on 04/16/14 and demonstrated normal left ventricular systolic function, EF 93-81%, diastolic dysfunction, moderate left atrial dilatation, moderate right atrial dilatation, mild aortic sclerosis, mild mitral regurgitation, moderate tricuspid regurgitation with RVSP 45 mmHg, and moderate concentric LVH. Lower extremity Dopplers on 4/13 demonstrated no evidence of DVT. Labs on 4/11 showed white blood cells 3.7, hemoglobin 11.7, platelets 135, BUN 13, creatinine 0.3. Chest xray showed no pulmonary edema.  Wt today 138 lbs. Wt on 1/21 136 lbs.  Her leg swelling is concentrated in the lower left leg and ankle and is better after taking low-dose Lasix. It  became worse after chemotherapy was started. She has one more cycle before completion. She seldom has chest pains at night. She denies palpitations. She used to wear compression stockings several years ago. She had venous surgery in the 1950's.    Review of Systems: As per "subjective", otherwise negative.  Allergies  Allergen Reactions  . Morphine Other (See Comments)    Current Outpatient Prescriptions  Medication Sig Dispense Refill  . acetaminophen (TYLENOL) 325 MG tablet Take 650 mg by mouth every 6 (six) hours as needed.    . ALPRAZolam (XANAX) 0.5 MG tablet Take 1 tablet by mouth 2 (two) times daily.  0  . Cholecalciferol (VITAMIN D3) 2000 UNITS TABS Take 1 tablet by mouth daily.    Marland Kitchen docusate sodium (COLACE) 100 MG capsule Take 100 mg by mouth 2 (two) times daily as needed.    . furosemide (LASIX) 20 MG tablet Take 0.5 tablets by mouth every other day.  1  . metoprolol succinate (TOPROL-XL) 25 MG 24 hr tablet Take 25 mg by mouth daily.    . Multiple Vitamins tablet Take 1 tablet by mouth daily.    . Omega-3 Fatty Acids (FISH OIL) 1000 MG CAPS Take 1 capsule by mouth 2 (two) times daily.    . traMADol (ULTRAM) 50 MG tablet Take 1 tablet by mouth every 8 (eight) hours as needed.  0   No current facility-administered medications for this visit.    Past Medical History  Diagnosis Date  . Hyperlipidemia   . Anxiety neurosis     No past surgical history on file.  History   Social History  . Marital Status: Married    Spouse Name: N/A  . Number of Children: N/A  .  Years of Education: N/A   Occupational History  . Not on file.   Social History Main Topics  . Smoking status: Never Smoker   . Smokeless tobacco: Never Used  . Alcohol Use: Not on file  . Drug Use: Not on file  . Sexual Activity: Not on file   Other Topics Concern  . Not on file   Social History Narrative     Filed Vitals:   04/28/14 1010  BP: 117/78  Pulse: 75  Height: 5\' 5"  (1.651 m)    Weight: 138 lb (62.596 kg)    PHYSICAL EXAM General: NAD HEENT: Normal. Neck: No JVD, no thyromegaly. Lungs: Clear to auscultation bilaterally with normal respiratory effort. CV: Nondisplaced PMI.  Regular rate and rhythm, normal S1/S2, no S3/S4, no murmur. 1+ nonpitting left periankle edema.  Normal pedal pulses.  Abdomen: Soft, nontender, no distention.  Neurologic: Alert and oriented x 3.  Psych: Normal affect. Skin: Normal. Musculoskeletal: Normal range of motion, no gross deformities. Extremities: No clubbing or cyanosis.   ECG: Most recent ECG reviewed.      ASSESSMENT AND PLAN: 1. Atrial fibrillation: This appears to be chronic. She has moderate left atrial enlargement. Her HR is currently controlled without AV nodal blocking agents. CHADSVASC score is sufficiently high enough to warrant anticoagulation therapy. Will start low dose Xarelto 15 mg daily to be initiated after final chemotherapy cycle. Will defer to oncologist with respect to timing of initiation.  2. Essential HTN: Well controlled today. Will monitor. No changes to meds.  3. Bilateral leg swelling: Likely due to chronic venous insufficiency. Will prescribe compression stockings. Can continue low-dose Lasix.  Dispo: f/u 6 months.  Time spent: 40 minutes, of which greater than 50% was spent reviewing symptoms, relevant blood tests and studies, and discussing management plan with the patient.   Kate Sable, M.D., F.A.C.C.

## 2014-04-28 NOTE — Patient Instructions (Signed)
   Begin Xarelto 15mg  daily with evening meal - to begin after discussing with oncology.   Printed scripts given on above.  One for free 30-day trial offer & regular prescription.    1 month follow up with anticoagulation nurse Lattie Haw) - patient to call to schedule.   Continue all other medications.   Low compression, knee high stockings - prescription given today. Your physician wants you to follow up in: 6 months.  You will receive a reminder letter in the mail one-two months in advance.  If you don't receive a letter, please call our office to schedule the follow up appointment

## 2014-05-05 DIAGNOSIS — D701 Agranulocytosis secondary to cancer chemotherapy: Secondary | ICD-10-CM | POA: Diagnosis not present

## 2014-05-05 DIAGNOSIS — G629 Polyneuropathy, unspecified: Secondary | ICD-10-CM | POA: Diagnosis not present

## 2014-05-05 DIAGNOSIS — I482 Chronic atrial fibrillation: Secondary | ICD-10-CM | POA: Diagnosis not present

## 2014-05-05 DIAGNOSIS — R531 Weakness: Secondary | ICD-10-CM | POA: Diagnosis not present

## 2014-05-05 DIAGNOSIS — C5701 Malignant neoplasm of right fallopian tube: Secondary | ICD-10-CM | POA: Diagnosis not present

## 2014-05-05 DIAGNOSIS — R112 Nausea with vomiting, unspecified: Secondary | ICD-10-CM | POA: Diagnosis not present

## 2014-05-05 DIAGNOSIS — Z5111 Encounter for antineoplastic chemotherapy: Secondary | ICD-10-CM | POA: Diagnosis not present

## 2014-05-05 DIAGNOSIS — R0689 Other abnormalities of breathing: Secondary | ICD-10-CM | POA: Diagnosis not present

## 2014-05-06 DIAGNOSIS — D701 Agranulocytosis secondary to cancer chemotherapy: Secondary | ICD-10-CM | POA: Diagnosis not present

## 2014-05-06 DIAGNOSIS — C5701 Malignant neoplasm of right fallopian tube: Secondary | ICD-10-CM | POA: Diagnosis not present

## 2014-05-07 DIAGNOSIS — R935 Abnormal findings on diagnostic imaging of other abdominal regions, including retroperitoneum: Secondary | ICD-10-CM | POA: Diagnosis not present

## 2014-05-07 DIAGNOSIS — N800 Endometriosis of uterus: Secondary | ICD-10-CM | POA: Diagnosis not present

## 2014-05-07 DIAGNOSIS — C5701 Malignant neoplasm of right fallopian tube: Secondary | ICD-10-CM | POA: Diagnosis not present

## 2014-05-07 DIAGNOSIS — K660 Peritoneal adhesions (postprocedural) (postinfection): Secondary | ICD-10-CM | POA: Diagnosis not present

## 2014-05-07 DIAGNOSIS — C562 Malignant neoplasm of left ovary: Secondary | ICD-10-CM | POA: Diagnosis not present

## 2014-05-07 DIAGNOSIS — Z0389 Encounter for observation for other suspected diseases and conditions ruled out: Secondary | ICD-10-CM | POA: Diagnosis not present

## 2014-05-07 DIAGNOSIS — R918 Other nonspecific abnormal finding of lung field: Secondary | ICD-10-CM | POA: Diagnosis not present

## 2014-05-15 DIAGNOSIS — F419 Anxiety disorder, unspecified: Secondary | ICD-10-CM | POA: Diagnosis not present

## 2014-05-15 DIAGNOSIS — M47814 Spondylosis without myelopathy or radiculopathy, thoracic region: Secondary | ICD-10-CM | POA: Diagnosis not present

## 2014-05-15 DIAGNOSIS — D6959 Other secondary thrombocytopenia: Secondary | ICD-10-CM | POA: Diagnosis not present

## 2014-05-15 DIAGNOSIS — Z79891 Long term (current) use of opiate analgesic: Secondary | ICD-10-CM | POA: Diagnosis not present

## 2014-05-15 DIAGNOSIS — Z7982 Long term (current) use of aspirin: Secondary | ICD-10-CM | POA: Diagnosis not present

## 2014-05-15 DIAGNOSIS — R509 Fever, unspecified: Secondary | ICD-10-CM | POA: Diagnosis not present

## 2014-05-15 DIAGNOSIS — A4151 Sepsis due to Escherichia coli [E. coli]: Secondary | ICD-10-CM | POA: Diagnosis present

## 2014-05-15 DIAGNOSIS — M438X4 Other specified deforming dorsopathies, thoracic region: Secondary | ICD-10-CM | POA: Diagnosis not present

## 2014-05-15 DIAGNOSIS — Z79899 Other long term (current) drug therapy: Secondary | ICD-10-CM | POA: Diagnosis not present

## 2014-05-15 DIAGNOSIS — M8588 Other specified disorders of bone density and structure, other site: Secondary | ICD-10-CM | POA: Diagnosis not present

## 2014-05-15 DIAGNOSIS — R502 Drug induced fever: Secondary | ICD-10-CM | POA: Diagnosis not present

## 2014-05-15 DIAGNOSIS — Z780 Asymptomatic menopausal state: Secondary | ICD-10-CM | POA: Diagnosis not present

## 2014-05-15 DIAGNOSIS — M438X6 Other specified deforming dorsopathies, lumbar region: Secondary | ICD-10-CM | POA: Diagnosis not present

## 2014-05-15 DIAGNOSIS — B9620 Unspecified Escherichia coli [E. coli] as the cause of diseases classified elsewhere: Secondary | ICD-10-CM | POA: Diagnosis not present

## 2014-05-15 DIAGNOSIS — Z886 Allergy status to analgesic agent status: Secondary | ICD-10-CM | POA: Diagnosis not present

## 2014-05-15 DIAGNOSIS — R32 Unspecified urinary incontinence: Secondary | ICD-10-CM | POA: Diagnosis present

## 2014-05-15 DIAGNOSIS — C5700 Malignant neoplasm of unspecified fallopian tube: Secondary | ICD-10-CM | POA: Diagnosis present

## 2014-05-15 DIAGNOSIS — T451X5A Adverse effect of antineoplastic and immunosuppressive drugs, initial encounter: Secondary | ICD-10-CM | POA: Diagnosis not present

## 2014-05-26 DIAGNOSIS — I5032 Chronic diastolic (congestive) heart failure: Secondary | ICD-10-CM | POA: Diagnosis not present

## 2014-05-26 DIAGNOSIS — R530 Neoplastic (malignant) related fatigue: Secondary | ICD-10-CM | POA: Diagnosis not present

## 2014-05-26 DIAGNOSIS — B9689 Other specified bacterial agents as the cause of diseases classified elsewhere: Secondary | ICD-10-CM | POA: Diagnosis not present

## 2014-06-03 DIAGNOSIS — C569 Malignant neoplasm of unspecified ovary: Secondary | ICD-10-CM | POA: Diagnosis not present

## 2014-06-03 DIAGNOSIS — R531 Weakness: Secondary | ICD-10-CM | POA: Diagnosis not present

## 2014-06-03 DIAGNOSIS — E860 Dehydration: Secondary | ICD-10-CM | POA: Diagnosis not present

## 2014-06-03 DIAGNOSIS — G629 Polyneuropathy, unspecified: Secondary | ICD-10-CM | POA: Diagnosis not present

## 2014-06-03 DIAGNOSIS — C5701 Malignant neoplasm of right fallopian tube: Secondary | ICD-10-CM | POA: Diagnosis not present

## 2014-06-03 DIAGNOSIS — I482 Chronic atrial fibrillation: Secondary | ICD-10-CM | POA: Diagnosis not present

## 2014-06-04 DIAGNOSIS — E860 Dehydration: Secondary | ICD-10-CM | POA: Diagnosis not present

## 2014-06-06 DIAGNOSIS — E860 Dehydration: Secondary | ICD-10-CM | POA: Diagnosis not present

## 2014-06-08 ENCOUNTER — Other Ambulatory Visit: Payer: Self-pay | Admitting: Cardiovascular Disease

## 2014-06-09 DIAGNOSIS — E860 Dehydration: Secondary | ICD-10-CM | POA: Diagnosis not present

## 2014-06-09 DIAGNOSIS — R531 Weakness: Secondary | ICD-10-CM | POA: Diagnosis not present

## 2014-06-09 DIAGNOSIS — C5701 Malignant neoplasm of right fallopian tube: Secondary | ICD-10-CM | POA: Diagnosis not present

## 2014-06-11 DIAGNOSIS — C5701 Malignant neoplasm of right fallopian tube: Secondary | ICD-10-CM | POA: Diagnosis not present

## 2014-06-11 DIAGNOSIS — E860 Dehydration: Secondary | ICD-10-CM | POA: Diagnosis not present

## 2014-06-11 DIAGNOSIS — I482 Chronic atrial fibrillation: Secondary | ICD-10-CM | POA: Diagnosis not present

## 2014-06-12 DIAGNOSIS — E860 Dehydration: Secondary | ICD-10-CM | POA: Diagnosis not present

## 2014-06-13 DIAGNOSIS — E860 Dehydration: Secondary | ICD-10-CM | POA: Diagnosis not present

## 2014-06-16 DIAGNOSIS — I482 Chronic atrial fibrillation: Secondary | ICD-10-CM | POA: Diagnosis not present

## 2014-06-16 DIAGNOSIS — C5701 Malignant neoplasm of right fallopian tube: Secondary | ICD-10-CM | POA: Diagnosis not present

## 2014-06-16 DIAGNOSIS — R5383 Other fatigue: Secondary | ICD-10-CM | POA: Diagnosis not present

## 2014-06-16 DIAGNOSIS — E860 Dehydration: Secondary | ICD-10-CM | POA: Diagnosis not present

## 2014-06-17 DIAGNOSIS — R5383 Other fatigue: Secondary | ICD-10-CM | POA: Diagnosis not present

## 2014-06-17 DIAGNOSIS — R531 Weakness: Secondary | ICD-10-CM | POA: Diagnosis not present

## 2014-06-17 DIAGNOSIS — E860 Dehydration: Secondary | ICD-10-CM | POA: Diagnosis not present

## 2014-06-17 DIAGNOSIS — C5701 Malignant neoplasm of right fallopian tube: Secondary | ICD-10-CM | POA: Diagnosis not present

## 2014-07-01 DIAGNOSIS — G629 Polyneuropathy, unspecified: Secondary | ICD-10-CM | POA: Diagnosis not present

## 2014-07-01 DIAGNOSIS — F329 Major depressive disorder, single episode, unspecified: Secondary | ICD-10-CM | POA: Diagnosis not present

## 2014-07-01 DIAGNOSIS — E039 Hypothyroidism, unspecified: Secondary | ICD-10-CM | POA: Diagnosis not present

## 2014-07-01 DIAGNOSIS — C5701 Malignant neoplasm of right fallopian tube: Secondary | ICD-10-CM | POA: Diagnosis not present

## 2014-07-01 DIAGNOSIS — E860 Dehydration: Secondary | ICD-10-CM | POA: Diagnosis not present

## 2014-07-02 DIAGNOSIS — E860 Dehydration: Secondary | ICD-10-CM | POA: Diagnosis not present

## 2014-07-02 DIAGNOSIS — C5701 Malignant neoplasm of right fallopian tube: Secondary | ICD-10-CM | POA: Diagnosis not present

## 2014-07-02 DIAGNOSIS — R531 Weakness: Secondary | ICD-10-CM | POA: Diagnosis not present

## 2014-07-04 DIAGNOSIS — R531 Weakness: Secondary | ICD-10-CM | POA: Diagnosis not present

## 2014-07-04 DIAGNOSIS — C5701 Malignant neoplasm of right fallopian tube: Secondary | ICD-10-CM | POA: Diagnosis not present

## 2014-07-04 DIAGNOSIS — E860 Dehydration: Secondary | ICD-10-CM | POA: Diagnosis not present

## 2014-07-28 DIAGNOSIS — G629 Polyneuropathy, unspecified: Secondary | ICD-10-CM | POA: Diagnosis not present

## 2014-07-28 DIAGNOSIS — M47812 Spondylosis without myelopathy or radiculopathy, cervical region: Secondary | ICD-10-CM | POA: Diagnosis not present

## 2014-07-28 DIAGNOSIS — I482 Chronic atrial fibrillation: Secondary | ICD-10-CM | POA: Diagnosis not present

## 2014-07-28 DIAGNOSIS — Z803 Family history of malignant neoplasm of breast: Secondary | ICD-10-CM | POA: Diagnosis not present

## 2014-07-28 DIAGNOSIS — M5021 Other cervical disc displacement,  high cervical region: Secondary | ICD-10-CM | POA: Diagnosis not present

## 2014-07-28 DIAGNOSIS — C5701 Malignant neoplasm of right fallopian tube: Secondary | ICD-10-CM | POA: Diagnosis not present

## 2014-07-28 DIAGNOSIS — M5022 Other cervical disc displacement, mid-cervical region: Secondary | ICD-10-CM | POA: Diagnosis not present

## 2014-07-28 DIAGNOSIS — E860 Dehydration: Secondary | ICD-10-CM | POA: Diagnosis not present

## 2014-07-28 DIAGNOSIS — M7989 Other specified soft tissue disorders: Secondary | ICD-10-CM | POA: Diagnosis not present

## 2014-08-06 DIAGNOSIS — C562 Malignant neoplasm of left ovary: Secondary | ICD-10-CM | POA: Diagnosis not present

## 2014-08-06 DIAGNOSIS — Z8544 Personal history of malignant neoplasm of other female genital organs: Secondary | ICD-10-CM | POA: Diagnosis not present

## 2014-08-06 DIAGNOSIS — Z08 Encounter for follow-up examination after completed treatment for malignant neoplasm: Secondary | ICD-10-CM | POA: Diagnosis not present

## 2014-08-06 DIAGNOSIS — C5701 Malignant neoplasm of right fallopian tube: Secondary | ICD-10-CM | POA: Diagnosis not present

## 2014-08-07 DIAGNOSIS — Z1231 Encounter for screening mammogram for malignant neoplasm of breast: Secondary | ICD-10-CM | POA: Diagnosis not present

## 2014-08-15 DIAGNOSIS — Z1389 Encounter for screening for other disorder: Secondary | ICD-10-CM | POA: Diagnosis not present

## 2014-08-15 DIAGNOSIS — R530 Neoplastic (malignant) related fatigue: Secondary | ICD-10-CM | POA: Diagnosis not present

## 2014-08-15 DIAGNOSIS — Z0000 Encounter for general adult medical examination without abnormal findings: Secondary | ICD-10-CM | POA: Diagnosis not present

## 2014-08-15 DIAGNOSIS — R5383 Other fatigue: Secondary | ICD-10-CM | POA: Diagnosis not present

## 2014-08-15 DIAGNOSIS — C5701 Malignant neoplasm of right fallopian tube: Secondary | ICD-10-CM | POA: Diagnosis not present

## 2014-08-15 DIAGNOSIS — I10 Essential (primary) hypertension: Secondary | ICD-10-CM | POA: Diagnosis not present

## 2014-08-15 DIAGNOSIS — Z95828 Presence of other vascular implants and grafts: Secondary | ICD-10-CM | POA: Diagnosis not present

## 2014-08-15 DIAGNOSIS — I4891 Unspecified atrial fibrillation: Secondary | ICD-10-CM | POA: Diagnosis not present

## 2014-09-04 DIAGNOSIS — R600 Localized edema: Secondary | ICD-10-CM | POA: Diagnosis not present

## 2014-09-04 DIAGNOSIS — I8392 Asymptomatic varicose veins of left lower extremity: Secondary | ICD-10-CM | POA: Diagnosis not present

## 2014-09-26 DIAGNOSIS — Z95828 Presence of other vascular implants and grafts: Secondary | ICD-10-CM | POA: Diagnosis not present

## 2014-10-14 ENCOUNTER — Encounter: Payer: Self-pay | Admitting: Cardiovascular Disease

## 2014-10-14 ENCOUNTER — Ambulatory Visit (INDEPENDENT_AMBULATORY_CARE_PROVIDER_SITE_OTHER): Payer: Medicare Other | Admitting: Cardiovascular Disease

## 2014-10-14 VITALS — BP 122/60 | HR 60 | Ht 66.0 in | Wt 134.0 lb

## 2014-10-14 DIAGNOSIS — I482 Chronic atrial fibrillation, unspecified: Secondary | ICD-10-CM

## 2014-10-14 DIAGNOSIS — C5700 Malignant neoplasm of unspecified fallopian tube: Secondary | ICD-10-CM | POA: Diagnosis not present

## 2014-10-14 DIAGNOSIS — I10 Essential (primary) hypertension: Secondary | ICD-10-CM

## 2014-10-14 DIAGNOSIS — R530 Neoplastic (malignant) related fatigue: Secondary | ICD-10-CM | POA: Diagnosis not present

## 2014-10-14 DIAGNOSIS — I83893 Varicose veins of bilateral lower extremities with other complications: Secondary | ICD-10-CM | POA: Diagnosis not present

## 2014-10-14 DIAGNOSIS — G59 Mononeuropathy in diseases classified elsewhere: Secondary | ICD-10-CM | POA: Diagnosis not present

## 2014-10-14 DIAGNOSIS — R5383 Other fatigue: Secondary | ICD-10-CM | POA: Diagnosis not present

## 2014-10-14 NOTE — Patient Instructions (Signed)
Continue all current medications. Your physician wants you to follow up in: 6 months.  You will receive a reminder letter in the mail one-two months in advance.  If you don't receive a letter, please call our office to schedule the follow up appointment   

## 2014-10-14 NOTE — Progress Notes (Signed)
Patient ID: Tracy Bowman, female   DOB: [birth year], 81 y.o.   MRN: [id number]      SUBJECTIVE: Tracy Bowman returns for follow-up of chronic atrial fibrillation. Echocardiogram was performed at [facility] on 04/16/14 and demonstrated normal left ventricular systolic function, EF 24-23%, diastolic dysfunction, moderate left atrial dilatation, moderate right atrial dilatation, mild aortic sclerosis, mild mitral regurgitation, moderate tricuspid regurgitation with RVSP 45 mmHg, and moderate concentric LVH. Lower extremity Dopplers on 4/13 demonstrated no evidence of DVT.  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   She was reluctant to take Xarelto 15 mg. She is no longer on a diuretic. She has felt "weak and wobbly" over the last few days. She had a few falls last month.   Soc: Her husband, Tracy Bowman, is also a patient of mine.   Review of Systems: As per "subjective", otherwise negative.  Allergies  Allergen Reactions  . Morphine Other (See Comments)    Current Outpatient Prescriptions  Medication Sig Dispense Refill  . acetaminophen (TYLENOL) 325 MG tablet Take 650 mg by mouth every 6 (six) hours as needed.    . ALPRAZolam (XANAX) 0.5 MG tablet Take 1 tablet by mouth at bedtime as needed.   0  . Cholecalciferol (VITAMIN D3) 2000 UNITS TABS Take 1 tablet by mouth daily.    Marland Kitchen docusate sodium (COLACE) 100 MG capsule Take 100 mg by mouth 2 (two) times daily as needed.    . furosemide (LASIX) 20 MG tablet Take 0.5 tablets by mouth every other day.  1  . metoprolol succinate (TOPROL-XL) 25 MG 24 hr tablet Take 25 mg by mouth daily.    . Multiple Vitamins tablet Take 1 tablet by mouth daily.    . Omega-3 Fatty Acids (FISH OIL) 1000 MG CAPS Take 1 capsule by mouth 2 (two) times daily.    . traMADol (ULTRAM) 50 MG tablet Take 1 tablet by mouth every 8 (eight)  hours as needed.  0   No current facility-administered medications for this visit.    Past Medical History  Diagnosis Date  . Hyperlipidemia   . Anxiety neurosis     No past surgical history on file.  Social History   Social History  . Marital Status: Married    Spouse Name: N/A  . Number of Children: N/A  . Years of Education: N/A   Occupational History  . Not on file.   Social History Main Topics  . Smoking status: Never Smoker   . Smokeless tobacco: Never Used  . Alcohol Use: Not on file  . Drug Use: Not on file  . Sexual Activity: Not on file   Other Topics Concern  . Not on file   Social History Narrative     Filed Vitals:   10/14/14 1441  BP: 122/60  Pulse: 60  Height: 5\' 6"  (1.676 m)  Weight: 134 lb (60.782 kg)  SpO2: 98%    PHYSICAL EXAM General: NAD HEENT: Normal. Neck: No JVD, no thyromegaly. Lungs: Clear to auscultation bilaterally with normal respiratory effort. CV: Irregular rhythm, normal rate, normal S1/S2, no S3, 2/6 pansystolic murmur along left sternal border. No pretibial or periankle edema.   Abdomen: Soft, nontender, no distention.  Neurologic: Alert and oriented x 3.  Psych: Normal affect. Skin: Normal. Musculoskeletal: No gross deformities. Extremities: No clubbing or cyanosis.   ECG: Most recent ECG reviewed.  ASSESSMENT AND PLAN: 1. Chronic atrial fibrillation: Stable. She has moderate left atrial enlargement. Her HR is currently controlled on Toprol-XL 25 mg. CHADSVASC score is sufficiently high enough to warrant anticoagulation therapy. However, she was reluctant to take Xarelto 15 mg daily. Given her falls recently, this may be the most prudent strategy.  2. Essential HTN: Well controlled today. Will monitor. No changes to meds.  3. Bilateral leg swelling: Likely due to chronic venous insufficiency. Continue compression stockings. No longer on Lasix.  Dispo: f/u 1 year.  Kate Sable, M.D., F.A.C.C.

## 2014-10-19 DIAGNOSIS — S0181XA Laceration without foreign body of other part of head, initial encounter: Secondary | ICD-10-CM | POA: Diagnosis not present

## 2014-10-19 DIAGNOSIS — J189 Pneumonia, unspecified organism: Secondary | ICD-10-CM | POA: Diagnosis not present

## 2014-10-19 DIAGNOSIS — R410 Disorientation, unspecified: Secondary | ICD-10-CM | POA: Diagnosis not present

## 2014-10-19 DIAGNOSIS — E871 Hypo-osmolality and hyponatremia: Secondary | ICD-10-CM | POA: Diagnosis not present

## 2014-10-19 DIAGNOSIS — S52502A Unspecified fracture of the lower end of left radius, initial encounter for closed fracture: Secondary | ICD-10-CM | POA: Diagnosis not present

## 2014-10-19 DIAGNOSIS — I4891 Unspecified atrial fibrillation: Secondary | ICD-10-CM | POA: Diagnosis not present

## 2014-10-19 DIAGNOSIS — T402X5A Adverse effect of other opioids, initial encounter: Secondary | ICD-10-CM | POA: Diagnosis not present

## 2014-10-19 DIAGNOSIS — S52612A Displaced fracture of left ulna styloid process, initial encounter for closed fracture: Secondary | ICD-10-CM | POA: Diagnosis not present

## 2014-10-19 DIAGNOSIS — S52592A Other fractures of lower end of left radius, initial encounter for closed fracture: Secondary | ICD-10-CM | POA: Diagnosis not present

## 2014-10-19 DIAGNOSIS — W0110XA Fall on same level from slipping, tripping and stumbling with subsequent striking against unspecified object, initial encounter: Secondary | ICD-10-CM | POA: Diagnosis not present

## 2014-10-20 DIAGNOSIS — S0081XA Abrasion of other part of head, initial encounter: Secondary | ICD-10-CM | POA: Diagnosis present

## 2014-10-20 DIAGNOSIS — S01411A Laceration without foreign body of right cheek and temporomandibular area, initial encounter: Secondary | ICD-10-CM | POA: Diagnosis present

## 2014-10-20 DIAGNOSIS — S52612A Displaced fracture of left ulna styloid process, initial encounter for closed fracture: Secondary | ICD-10-CM | POA: Diagnosis not present

## 2014-10-20 DIAGNOSIS — Z79891 Long term (current) use of opiate analgesic: Secondary | ICD-10-CM | POA: Diagnosis not present

## 2014-10-20 DIAGNOSIS — R942 Abnormal results of pulmonary function studies: Secondary | ICD-10-CM | POA: Diagnosis not present

## 2014-10-20 DIAGNOSIS — S52622A Torus fracture of lower end of left ulna, initial encounter for closed fracture: Secondary | ICD-10-CM | POA: Diagnosis not present

## 2014-10-20 DIAGNOSIS — I4891 Unspecified atrial fibrillation: Secondary | ICD-10-CM | POA: Diagnosis not present

## 2014-10-20 DIAGNOSIS — S299XXA Unspecified injury of thorax, initial encounter: Secondary | ICD-10-CM | POA: Diagnosis not present

## 2014-10-20 DIAGNOSIS — F418 Other specified anxiety disorders: Secondary | ICD-10-CM | POA: Diagnosis present

## 2014-10-20 DIAGNOSIS — J17 Pneumonia in diseases classified elsewhere: Secondary | ICD-10-CM | POA: Diagnosis not present

## 2014-10-20 DIAGNOSIS — B379 Candidiasis, unspecified: Secondary | ICD-10-CM | POA: Diagnosis present

## 2014-10-20 DIAGNOSIS — E8770 Fluid overload, unspecified: Secondary | ICD-10-CM | POA: Diagnosis present

## 2014-10-20 DIAGNOSIS — E860 Dehydration: Secondary | ICD-10-CM | POA: Diagnosis present

## 2014-10-20 DIAGNOSIS — R410 Disorientation, unspecified: Secondary | ICD-10-CM | POA: Diagnosis not present

## 2014-10-20 DIAGNOSIS — Z79899 Other long term (current) drug therapy: Secondary | ICD-10-CM | POA: Diagnosis not present

## 2014-10-20 DIAGNOSIS — Z886 Allergy status to analgesic agent status: Secondary | ICD-10-CM | POA: Diagnosis not present

## 2014-10-20 DIAGNOSIS — J189 Pneumonia, unspecified organism: Secondary | ICD-10-CM | POA: Diagnosis not present

## 2014-10-20 DIAGNOSIS — I517 Cardiomegaly: Secondary | ICD-10-CM | POA: Diagnosis not present

## 2014-10-20 DIAGNOSIS — S52592A Other fractures of lower end of left radius, initial encounter for closed fracture: Secondary | ICD-10-CM | POA: Diagnosis not present

## 2014-10-20 DIAGNOSIS — R4181 Age-related cognitive decline: Secondary | ICD-10-CM | POA: Diagnosis not present

## 2014-10-20 DIAGNOSIS — T402X5A Adverse effect of other opioids, initial encounter: Secondary | ICD-10-CM | POA: Diagnosis not present

## 2014-10-20 DIAGNOSIS — R7989 Other specified abnormal findings of blood chemistry: Secondary | ICD-10-CM | POA: Diagnosis not present

## 2014-10-20 DIAGNOSIS — Z8589 Personal history of malignant neoplasm of other organs and systems: Secondary | ICD-10-CM | POA: Diagnosis not present

## 2014-10-20 DIAGNOSIS — S52572A Other intraarticular fracture of lower end of left radius, initial encounter for closed fracture: Secondary | ICD-10-CM | POA: Diagnosis not present

## 2014-10-20 DIAGNOSIS — E039 Hypothyroidism, unspecified: Secondary | ICD-10-CM | POA: Diagnosis present

## 2014-10-20 DIAGNOSIS — E871 Hypo-osmolality and hyponatremia: Secondary | ICD-10-CM | POA: Diagnosis not present

## 2014-10-31 DIAGNOSIS — E039 Hypothyroidism, unspecified: Secondary | ICD-10-CM | POA: Diagnosis not present

## 2014-10-31 DIAGNOSIS — C5701 Malignant neoplasm of right fallopian tube: Secondary | ICD-10-CM | POA: Diagnosis not present

## 2014-10-31 DIAGNOSIS — Z95828 Presence of other vascular implants and grafts: Secondary | ICD-10-CM | POA: Diagnosis not present

## 2014-10-31 DIAGNOSIS — Z803 Family history of malignant neoplasm of breast: Secondary | ICD-10-CM | POA: Diagnosis not present

## 2014-10-31 DIAGNOSIS — C5700 Malignant neoplasm of unspecified fallopian tube: Secondary | ICD-10-CM | POA: Diagnosis not present

## 2014-11-03 DIAGNOSIS — Z08 Encounter for follow-up examination after completed treatment for malignant neoplasm: Secondary | ICD-10-CM | POA: Diagnosis not present

## 2014-11-03 DIAGNOSIS — C5701 Malignant neoplasm of right fallopian tube: Secondary | ICD-10-CM | POA: Diagnosis not present

## 2014-11-03 DIAGNOSIS — Z9221 Personal history of antineoplastic chemotherapy: Secondary | ICD-10-CM | POA: Diagnosis not present

## 2014-11-03 DIAGNOSIS — Z8544 Personal history of malignant neoplasm of other female genital organs: Secondary | ICD-10-CM | POA: Diagnosis not present

## 2014-11-07 DIAGNOSIS — J17 Pneumonia in diseases classified elsewhere: Secondary | ICD-10-CM | POA: Diagnosis not present

## 2014-11-11 DIAGNOSIS — Z95828 Presence of other vascular implants and grafts: Secondary | ICD-10-CM | POA: Diagnosis not present

## 2014-11-11 DIAGNOSIS — C5701 Malignant neoplasm of right fallopian tube: Secondary | ICD-10-CM | POA: Diagnosis not present

## 2014-11-11 DIAGNOSIS — S52531A Colles' fracture of right radius, initial encounter for closed fracture: Secondary | ICD-10-CM | POA: Diagnosis not present

## 2014-11-18 DIAGNOSIS — Z23 Encounter for immunization: Secondary | ICD-10-CM | POA: Diagnosis not present

## 2014-12-02 DIAGNOSIS — S52531A Colles' fracture of right radius, initial encounter for closed fracture: Secondary | ICD-10-CM | POA: Diagnosis not present

## 2014-12-19 DIAGNOSIS — Z95828 Presence of other vascular implants and grafts: Secondary | ICD-10-CM | POA: Diagnosis not present

## 2014-12-23 DIAGNOSIS — S52531A Colles' fracture of right radius, initial encounter for closed fracture: Secondary | ICD-10-CM | POA: Diagnosis not present

## 2014-12-29 DIAGNOSIS — H543 Unqualified visual loss, both eyes: Secondary | ICD-10-CM | POA: Diagnosis not present

## 2015-01-27 DIAGNOSIS — F329 Major depressive disorder, single episode, unspecified: Secondary | ICD-10-CM | POA: Diagnosis not present

## 2015-01-27 DIAGNOSIS — R531 Weakness: Secondary | ICD-10-CM | POA: Diagnosis not present

## 2015-01-27 DIAGNOSIS — C5701 Malignant neoplasm of right fallopian tube: Secondary | ICD-10-CM | POA: Diagnosis not present

## 2015-01-27 DIAGNOSIS — G629 Polyneuropathy, unspecified: Secondary | ICD-10-CM | POA: Diagnosis not present

## 2015-01-27 DIAGNOSIS — Z95828 Presence of other vascular implants and grafts: Secondary | ICD-10-CM | POA: Diagnosis not present

## 2015-01-27 DIAGNOSIS — M7989 Other specified soft tissue disorders: Secondary | ICD-10-CM | POA: Diagnosis not present

## 2015-01-27 DIAGNOSIS — F419 Anxiety disorder, unspecified: Secondary | ICD-10-CM | POA: Diagnosis not present

## 2015-02-09 DIAGNOSIS — Z8544 Personal history of malignant neoplasm of other female genital organs: Secondary | ICD-10-CM | POA: Diagnosis not present

## 2015-02-09 DIAGNOSIS — Z9221 Personal history of antineoplastic chemotherapy: Secondary | ICD-10-CM | POA: Diagnosis not present

## 2015-02-09 DIAGNOSIS — Z08 Encounter for follow-up examination after completed treatment for malignant neoplasm: Secondary | ICD-10-CM | POA: Diagnosis not present

## 2015-02-09 DIAGNOSIS — C5701 Malignant neoplasm of right fallopian tube: Secondary | ICD-10-CM | POA: Diagnosis not present

## 2015-03-10 DIAGNOSIS — G629 Polyneuropathy, unspecified: Secondary | ICD-10-CM | POA: Diagnosis not present

## 2015-03-10 DIAGNOSIS — C5701 Malignant neoplasm of right fallopian tube: Secondary | ICD-10-CM | POA: Diagnosis not present

## 2015-03-10 DIAGNOSIS — I482 Chronic atrial fibrillation: Secondary | ICD-10-CM | POA: Diagnosis not present

## 2015-03-10 DIAGNOSIS — Z803 Family history of malignant neoplasm of breast: Secondary | ICD-10-CM | POA: Diagnosis not present

## 2015-03-10 DIAGNOSIS — E039 Hypothyroidism, unspecified: Secondary | ICD-10-CM | POA: Diagnosis not present

## 2015-03-10 DIAGNOSIS — Z95828 Presence of other vascular implants and grafts: Secondary | ICD-10-CM | POA: Diagnosis not present

## 2015-03-25 DIAGNOSIS — Z961 Presence of intraocular lens: Secondary | ICD-10-CM | POA: Diagnosis not present

## 2015-03-30 DIAGNOSIS — I10 Essential (primary) hypertension: Secondary | ICD-10-CM | POA: Diagnosis not present

## 2015-03-30 DIAGNOSIS — R5383 Other fatigue: Secondary | ICD-10-CM | POA: Diagnosis not present

## 2015-04-21 DIAGNOSIS — F419 Anxiety disorder, unspecified: Secondary | ICD-10-CM | POA: Diagnosis not present

## 2015-04-21 DIAGNOSIS — F329 Major depressive disorder, single episode, unspecified: Secondary | ICD-10-CM | POA: Diagnosis not present

## 2015-04-21 DIAGNOSIS — Z95828 Presence of other vascular implants and grafts: Secondary | ICD-10-CM | POA: Diagnosis not present

## 2015-04-21 DIAGNOSIS — C5701 Malignant neoplasm of right fallopian tube: Secondary | ICD-10-CM | POA: Diagnosis not present

## 2015-05-11 DIAGNOSIS — C5701 Malignant neoplasm of right fallopian tube: Secondary | ICD-10-CM | POA: Diagnosis not present

## 2015-05-11 DIAGNOSIS — Z9221 Personal history of antineoplastic chemotherapy: Secondary | ICD-10-CM | POA: Diagnosis not present

## 2015-05-11 DIAGNOSIS — Z9079 Acquired absence of other genital organ(s): Secondary | ICD-10-CM | POA: Diagnosis not present

## 2015-05-11 DIAGNOSIS — Z90710 Acquired absence of both cervix and uterus: Secondary | ICD-10-CM | POA: Diagnosis not present

## 2015-05-11 DIAGNOSIS — Z90722 Acquired absence of ovaries, bilateral: Secondary | ICD-10-CM | POA: Diagnosis not present

## 2015-05-11 DIAGNOSIS — Z9049 Acquired absence of other specified parts of digestive tract: Secondary | ICD-10-CM | POA: Diagnosis not present

## 2015-06-04 DIAGNOSIS — J40 Bronchitis, not specified as acute or chronic: Secondary | ICD-10-CM | POA: Diagnosis not present

## 2015-06-16 DIAGNOSIS — M1990 Unspecified osteoarthritis, unspecified site: Secondary | ICD-10-CM | POA: Diagnosis not present

## 2015-06-16 DIAGNOSIS — S7001XA Contusion of right hip, initial encounter: Secondary | ICD-10-CM | POA: Diagnosis not present

## 2015-06-16 DIAGNOSIS — Z7982 Long term (current) use of aspirin: Secondary | ICD-10-CM | POA: Diagnosis not present

## 2015-06-16 DIAGNOSIS — Z8544 Personal history of malignant neoplasm of other female genital organs: Secondary | ICD-10-CM | POA: Diagnosis not present

## 2015-06-16 DIAGNOSIS — F419 Anxiety disorder, unspecified: Secondary | ICD-10-CM | POA: Diagnosis not present

## 2015-06-16 DIAGNOSIS — W07XXXA Fall from chair, initial encounter: Secondary | ICD-10-CM | POA: Diagnosis not present

## 2015-06-16 DIAGNOSIS — Z85038 Personal history of other malignant neoplasm of large intestine: Secondary | ICD-10-CM | POA: Diagnosis not present

## 2015-06-16 DIAGNOSIS — Z79899 Other long term (current) drug therapy: Secondary | ICD-10-CM | POA: Diagnosis not present

## 2015-06-16 DIAGNOSIS — M25551 Pain in right hip: Secondary | ICD-10-CM | POA: Diagnosis not present

## 2015-06-16 DIAGNOSIS — S76911A Strain of unspecified muscles, fascia and tendons at thigh level, right thigh, initial encounter: Secondary | ICD-10-CM | POA: Diagnosis not present

## 2015-06-24 DIAGNOSIS — S72091A Other fracture of head and neck of right femur, initial encounter for closed fracture: Secondary | ICD-10-CM | POA: Diagnosis not present

## 2015-06-24 DIAGNOSIS — Z9181 History of falling: Secondary | ICD-10-CM | POA: Diagnosis not present

## 2015-06-24 DIAGNOSIS — S3289XA Fracture of other parts of pelvis, initial encounter for closed fracture: Secondary | ICD-10-CM | POA: Diagnosis not present

## 2015-06-24 DIAGNOSIS — S32591A Other specified fracture of right pubis, initial encounter for closed fracture: Secondary | ICD-10-CM | POA: Diagnosis not present

## 2015-06-25 DIAGNOSIS — Z95828 Presence of other vascular implants and grafts: Secondary | ICD-10-CM | POA: Diagnosis not present

## 2015-06-30 DIAGNOSIS — R262 Difficulty in walking, not elsewhere classified: Secondary | ICD-10-CM | POA: Diagnosis not present

## 2015-06-30 DIAGNOSIS — S3289XD Fracture of other parts of pelvis, subsequent encounter for fracture with routine healing: Secondary | ICD-10-CM | POA: Diagnosis not present

## 2015-06-30 DIAGNOSIS — S3282XD Multiple fractures of pelvis without disruption of pelvic ring, subsequent encounter for fracture with routine healing: Secondary | ICD-10-CM | POA: Diagnosis not present

## 2015-07-02 DIAGNOSIS — R262 Difficulty in walking, not elsewhere classified: Secondary | ICD-10-CM | POA: Diagnosis not present

## 2015-07-02 DIAGNOSIS — S3289XD Fracture of other parts of pelvis, subsequent encounter for fracture with routine healing: Secondary | ICD-10-CM | POA: Diagnosis not present

## 2015-07-06 DIAGNOSIS — R262 Difficulty in walking, not elsewhere classified: Secondary | ICD-10-CM | POA: Diagnosis not present

## 2015-07-06 DIAGNOSIS — S3289XD Fracture of other parts of pelvis, subsequent encounter for fracture with routine healing: Secondary | ICD-10-CM | POA: Diagnosis not present

## 2015-07-09 DIAGNOSIS — E038 Other specified hypothyroidism: Secondary | ICD-10-CM | POA: Diagnosis not present

## 2015-07-09 DIAGNOSIS — S3289XD Fracture of other parts of pelvis, subsequent encounter for fracture with routine healing: Secondary | ICD-10-CM | POA: Diagnosis not present

## 2015-07-09 DIAGNOSIS — M1389 Other specified arthritis, multiple sites: Secondary | ICD-10-CM | POA: Diagnosis not present

## 2015-07-09 DIAGNOSIS — I10 Essential (primary) hypertension: Secondary | ICD-10-CM | POA: Diagnosis not present

## 2015-07-09 DIAGNOSIS — G59 Mononeuropathy in diseases classified elsewhere: Secondary | ICD-10-CM | POA: Diagnosis not present

## 2015-07-09 DIAGNOSIS — R262 Difficulty in walking, not elsewhere classified: Secondary | ICD-10-CM | POA: Diagnosis not present

## 2015-07-09 DIAGNOSIS — F418 Other specified anxiety disorders: Secondary | ICD-10-CM | POA: Diagnosis not present

## 2015-07-10 DIAGNOSIS — Z452 Encounter for adjustment and management of vascular access device: Secondary | ICD-10-CM | POA: Diagnosis not present

## 2015-07-14 DIAGNOSIS — R262 Difficulty in walking, not elsewhere classified: Secondary | ICD-10-CM | POA: Diagnosis not present

## 2015-07-14 DIAGNOSIS — S3289XD Fracture of other parts of pelvis, subsequent encounter for fracture with routine healing: Secondary | ICD-10-CM | POA: Diagnosis not present

## 2015-07-16 DIAGNOSIS — S3289XD Fracture of other parts of pelvis, subsequent encounter for fracture with routine healing: Secondary | ICD-10-CM | POA: Diagnosis not present

## 2015-07-16 DIAGNOSIS — R262 Difficulty in walking, not elsewhere classified: Secondary | ICD-10-CM | POA: Diagnosis not present

## 2015-07-21 DIAGNOSIS — R262 Difficulty in walking, not elsewhere classified: Secondary | ICD-10-CM | POA: Diagnosis not present

## 2015-07-21 DIAGNOSIS — S3289XD Fracture of other parts of pelvis, subsequent encounter for fracture with routine healing: Secondary | ICD-10-CM | POA: Diagnosis not present

## 2015-07-23 DIAGNOSIS — S3289XD Fracture of other parts of pelvis, subsequent encounter for fracture with routine healing: Secondary | ICD-10-CM | POA: Diagnosis not present

## 2015-07-23 DIAGNOSIS — R262 Difficulty in walking, not elsewhere classified: Secondary | ICD-10-CM | POA: Diagnosis not present

## 2015-07-28 DIAGNOSIS — S3289XD Fracture of other parts of pelvis, subsequent encounter for fracture with routine healing: Secondary | ICD-10-CM | POA: Diagnosis not present

## 2015-07-28 DIAGNOSIS — R262 Difficulty in walking, not elsewhere classified: Secondary | ICD-10-CM | POA: Diagnosis not present

## 2015-07-30 DIAGNOSIS — R262 Difficulty in walking, not elsewhere classified: Secondary | ICD-10-CM | POA: Diagnosis not present

## 2015-07-30 DIAGNOSIS — S3289XD Fracture of other parts of pelvis, subsequent encounter for fracture with routine healing: Secondary | ICD-10-CM | POA: Diagnosis not present

## 2015-08-05 DIAGNOSIS — I10 Essential (primary) hypertension: Secondary | ICD-10-CM | POA: Diagnosis not present

## 2015-08-05 DIAGNOSIS — E038 Other specified hypothyroidism: Secondary | ICD-10-CM | POA: Diagnosis not present

## 2015-08-05 DIAGNOSIS — M1389 Other specified arthritis, multiple sites: Secondary | ICD-10-CM | POA: Diagnosis not present

## 2015-08-05 DIAGNOSIS — G59 Mononeuropathy in diseases classified elsewhere: Secondary | ICD-10-CM | POA: Diagnosis not present

## 2015-08-05 DIAGNOSIS — F418 Other specified anxiety disorders: Secondary | ICD-10-CM | POA: Diagnosis not present

## 2015-08-10 DIAGNOSIS — Z1231 Encounter for screening mammogram for malignant neoplasm of breast: Secondary | ICD-10-CM | POA: Diagnosis not present

## 2015-08-19 ENCOUNTER — Encounter (HOSPITAL_BASED_OUTPATIENT_CLINIC_OR_DEPARTMENT_OTHER): Payer: Medicare Other

## 2015-08-19 ENCOUNTER — Encounter (HOSPITAL_COMMUNITY): Payer: Medicare Other | Attending: Oncology | Admitting: Oncology

## 2015-08-19 ENCOUNTER — Encounter (HOSPITAL_COMMUNITY): Payer: Self-pay | Admitting: Oncology

## 2015-08-19 VITALS — BP 141/78 | HR 59 | Temp 97.7°F | Resp 24 | Ht 64.75 in | Wt 175.4 lb

## 2015-08-19 DIAGNOSIS — C5701 Malignant neoplasm of right fallopian tube: Secondary | ICD-10-CM | POA: Diagnosis not present

## 2015-08-19 DIAGNOSIS — Z452 Encounter for adjustment and management of vascular access device: Secondary | ICD-10-CM | POA: Diagnosis not present

## 2015-08-19 LAB — CBC WITH DIFFERENTIAL/PLATELET
BASOS ABS: 0 10*3/uL (ref 0.0–0.1)
Basophils Relative: 0 %
EOS ABS: 0.1 10*3/uL (ref 0.0–0.7)
EOS PCT: 2 %
HCT: 41.3 % (ref 36.0–46.0)
Hemoglobin: 13.8 g/dL (ref 12.0–15.0)
LYMPHS ABS: 2 10*3/uL (ref 0.7–4.0)
LYMPHS PCT: 32 %
MCH: 32.4 pg (ref 26.0–34.0)
MCHC: 33.4 g/dL (ref 30.0–36.0)
MCV: 96.9 fL (ref 78.0–100.0)
MONO ABS: 0.6 10*3/uL (ref 0.1–1.0)
Monocytes Relative: 10 %
Neutro Abs: 3.4 10*3/uL (ref 1.7–7.7)
Neutrophils Relative %: 56 %
PLATELETS: 161 10*3/uL (ref 150–400)
RBC: 4.26 MIL/uL (ref 3.87–5.11)
RDW: 13.9 % (ref 11.5–15.5)
WBC: 6.1 10*3/uL (ref 4.0–10.5)

## 2015-08-19 LAB — COMPREHENSIVE METABOLIC PANEL
ALT: 20 U/L (ref 14–54)
ANION GAP: 4 — AB (ref 5–15)
AST: 25 U/L (ref 15–41)
Albumin: 4.1 g/dL (ref 3.5–5.0)
Alkaline Phosphatase: 83 U/L (ref 38–126)
BUN: 18 mg/dL (ref 6–20)
CHLORIDE: 107 mmol/L (ref 101–111)
CO2: 27 mmol/L (ref 22–32)
Calcium: 8.4 mg/dL — ABNORMAL LOW (ref 8.9–10.3)
Creatinine, Ser: 0.69 mg/dL (ref 0.44–1.00)
Glucose, Bld: 121 mg/dL — ABNORMAL HIGH (ref 65–99)
POTASSIUM: 3.7 mmol/L (ref 3.5–5.1)
SODIUM: 138 mmol/L (ref 135–145)
Total Bilirubin: 0.9 mg/dL (ref 0.3–1.2)
Total Protein: 7.2 g/dL (ref 6.5–8.1)

## 2015-08-19 MED ORDER — SODIUM CHLORIDE 0.9% FLUSH
10.0000 mL | INTRAVENOUS | Status: DC | PRN
  Administered 2015-08-19: 10 mL via INTRAVENOUS
  Filled 2015-08-19: qty 10

## 2015-08-19 MED ORDER — HEPARIN SOD (PORK) LOCK FLUSH 100 UNIT/ML IV SOLN
500.0000 [IU] | Freq: Once | INTRAVENOUS | Status: AC
  Administered 2015-08-19: 500 [IU] via INTRAVENOUS

## 2015-08-19 NOTE — Patient Instructions (Signed)
[facility] at [facility] Discharge Instructions  RECOMMENDATIONS MADE BY THE CONSULTANT AND ANY TEST RESULTS WILL BE SENT TO YOUR REFERRING PHYSICIAN.  You saw Dr. Oliva Bustard in place of Dr. Whitney Muse today.  Take 3 fluid pills in the morning for 15 days then decrease to 2 fluid pills. Call Dr. Sherrie Sport for follow up when you get home. Tell him you have a lot of fluid in your legs and abdomen. The oncologist wants you to see Dr. Sherrie Sport this week if possible. Also ask him to send you to a cardiologist. Lab work today-we will call you with results. Follow up in 3 months with lab work.   Thank you for choosing [facility] at [facility] to provide your oncology and hematology care.  To afford each patient quality time with our provider, please arrive at least 15 minutes before your scheduled appointment time.   Beginning January 23rd 2017 lab work for the [facility] will be done in the  Main lab at [facility] on 1st floor. If you have a lab appointment with the [facility] please come in thru the  Main Entrance and check in at the main information desk  You need to re-schedule your appointment should you arrive 10 or more minutes late.  We strive to give you quality time with our providers, and arriving late affects you and other patients whose appointments are after yours.  Also, if you no show three or more times for appointments you may be dismissed from the clinic at the providers discretion.     Again, thank you for choosing [facility].  Our hope is that these requests will decrease the amount of time that you wait before being seen by our physicians.       _____________________________________________________________  Should you have questions after your visit to [facility], please contact our office at [phone] between the hours of 8:30 a.m. and 4:30 p.m.  Voicemails left after 4:30 p.m. will not be returned  until the following business day.  For prescription refill requests, have your pharmacy contact our office.         Resources For Cancer Patients and their Caregivers ? American Cancer Society: Can assist with transportation, wigs, general needs, runs Look Good Feel Better.        (828) 444-2506 ? Cancer Care: Provides financial assistance, online support groups, medication/co-pay assistance.  1-800-813-HOPE 757-225-2610) ? [facility] Assists [place] cancer patients and their families through emotional , educational and financial support.  519 607 0540 ? [place] DSS Where to apply for food stamps, Medicaid and utility assistance. 346-301-9612 ? [facility]: Transportation to medical appointments. 519-610-6353 ? Social Security Administration: May apply for disability if have a Stage IV cancer. 7638660541 828 442 6346 ? [employer], Disability and Transit Services: Assists with nutrition, care and transit needs. [place] Support Programs: @10RELATIVEDAYS @ > Cancer Support Group  2nd Tuesday of the month 1pm-2pm, Journey Room  > Creative Journey  3rd Tuesday of the month 1130am-1pm, Journey Room  > Look Good Feel Better  1st Wednesday of the month 10am-12 noon, Journey Room (Call [facility] to register [phone])

## 2015-08-19 NOTE — Progress Notes (Signed)
[facility] 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  Information has been obtained reviewing chart) Status post 6 cycles of chemotherapy with carboplatinum and Taxol  VISIT DIAGNOSIS:     ICD-9-CM ICD-10-CM   1. Malignant neoplasm of fallopian tube, right ([place]) 183.2 C57.01 CBC with Differential     Comprehensive metabolic panel     CA 865     CBC with Differential     Comprehensive metabolic panel     CA 784      No history exists.    No flowsheet data found.  INTERVAL HISTORY: 82 year old lady came today further follow-up had a frequent evaluation with medical oncologist in   [facility] as well as at [facility] in [place], He should not is here to establish oncology care [place] in [place] closed  Patient complains of gaining weight.  On record patient is gained 40 pounds of weight in the last few months.  Mainly has noticed swelling of lower extremity and abdominal distention  Patient has recently seen by primary care physician on Lasix 20 mg twice a day Patient used to see previously cardiologists but her cardiologist has moved out of town Recently patient had  a fall and had pelvic  Fracture Due to multiple medical problems patient's condition has been slowly declining    REVIEW OF SYSTEMS:   Gen. status: Patient's performance status is somewhat poor   HEENT: Complains of   Dry  Tongue No difficulty swallowing. Lungs: Shortness of breath on exertion.  Dry hacking cough. GI: Mild abdominal bloating Cardiac: No chest pain.  Has gained weight due to retention of fluid Lower extremity increasing swelling of lower extremity Skin: No rash. Neurological system no headache no dizziness Some memory issue has been noticed All other systems review (total planned system is been reported to be negative  As per HPI. Otherwise, a complete review of systems is negatve.  PAST MEDICAL HISTORY: Past Medical History:  Diagnosis Date  . Anxiety neurosis   . Hyperlipidemia     PAST SURGICAL HISTORY: No past surgical history on file.  FAMILY HISTORY Family History  Problem Relation Age of Onset  . Colon cancer Father   . Congestive Heart Failure Mother   . Dementia Brother   . Breast cancer Sister     GYNECOLOGIC HISTORY:  No LMP recorded.     ADVANCED DIRECTIVES:  Patient does not have any living will or healthcare power of attorney.  Information was given .  Available resources had been discussed.  We will follow-up on subsequent appointments regarding this issue  HEALTH MAINTENANCE: Social History  Substance Use Topics  . Smoking status: Never Smoker  . Smokeless tobacco: Never Used  . Alcohol use Not on file     Allergies  Allergen Reactions  . Morphine Other (See Comments)    Current Outpatient Prescriptions  Medication Sig Dispense Refill  . acetaminophen (TYLENOL) 325 MG tablet Take 650 mg by mouth every 6 (six) hours as needed.    . ALPRAZolam (XANAX) 0.5 MG tablet Take 1 tablet by mouth at bedtime  as needed.   0  . Cholecalciferol (VITAMIN D3) 2000 UNITS TABS Take 1 tablet by mouth daily.    Marland Kitchen docusate sodium (COLACE) 100  MG capsule Take 100 mg by mouth 2 (two) times daily as needed.    . furosemide (LASIX) 20 MG tablet Take 20 mg by mouth.    . levothyroxine (SYNTHROID, LEVOTHROID) 25 MCG tablet Take by mouth.    . metoprolol succinate (TOPROL-XL) 25 MG 24 hr tablet Take 25 mg by mouth daily.    . mirtazapine (REMERON) 15 MG tablet Take by mouth.    . Multiple Vitamins tablet Take 1 tablet by mouth daily.    . Omega-3 Fatty Acids (FISH OIL) 1000 MG CAPS Take 1 capsule by mouth 2 (two) times daily.    . traMADol (ULTRAM) 50 MG tablet Take 1 tablet by mouth every 8 (eight) hours as needed.  0   No current facility-administered medications for this visit.     OBJECTIVE: PHYSICAL EXAM: Gen. status: Patient is alert oriented but not in any acute distress   Lymphatic system: No palpable supraclavicular or cervical axillary or adenopathy. Head exam was generally normal. There was no scleral icterus or corneal arcus. Mucous membranes were moist. Cardiac: Soft systolic murmur.  Occasionally irregular heart sounds. Lungs: Diminished air entry on both sides dictation no rhonchi no rales Examination of the skin revealed no evidence of significant rashes, suspicious appearing nevi or other concerning lesions. Neurologically, the patient was awake, alert, and oriented to person, place and time. There were no obvious focal neurologic abnormalities. Lower extremity 2+ edema abdomen: Distention.  Abdominal wall edema.  No tenderness.  No palpable masses. Breast examination was not done   Vitals:   08/19/15 1526  BP: (!) 141/78  Pulse: (!) 59  Resp: (!) 24  Temp: 97.7 F (36.5 C)     Body mass index is 29.41 kg/m.    ECOG FS:2 - Symptomatic, <50% confined to bed  LAB RESULTS:  CBC and comprehensive metabolic panel and CA D-826 pending    STUDIES: No results found.  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  Contact primary care physician  Patient was also advised to get cardiological evaluation done husband will make an appointment for cardiologist who had seen her before   Lab data would be reviewed and is available  According to patient had a mammogram done but report not available for my review.    PLAN:  Increase Lasix for 15 days.  To 60 mg and then go back on routine dose of 40 mg daily Lab data would be checked for chemistry and potassium level CA I-125 liver is not available if it is very high and surveillance CT scan will be ordered Patient is getting regular checkups done at gynecologists oncologist at [place] follow-up in 3 months minutes CBC and comprehensive metabolic panel   Patient expressed understanding and was in agreement with this plan. She also understands that She can call clinic at any time with any questions, concerns, or complaints.    No matching staging information was found for the patient.  Forest Gleason, MD  08/19/2015 3:45 PM

## 2015-08-19 NOTE — Progress Notes (Signed)
Tracy Bowman presented for Portacath access and flush.  Portacath located right chest wall accessed with  H 20 needle.  Good blood return present. Portacath flushed with 17ml NS and 500U/51ml Heparin and needle removed intact.  Procedure tolerated well and without incident.

## 2015-08-20 LAB — CA 125: CA 125: 6.4 U/mL (ref 0.0–38.1)

## 2015-08-24 DIAGNOSIS — Z90710 Acquired absence of both cervix and uterus: Secondary | ICD-10-CM | POA: Diagnosis not present

## 2015-08-24 DIAGNOSIS — C5701 Malignant neoplasm of right fallopian tube: Secondary | ICD-10-CM | POA: Diagnosis not present

## 2015-08-24 DIAGNOSIS — Z8544 Personal history of malignant neoplasm of other female genital organs: Secondary | ICD-10-CM | POA: Diagnosis not present

## 2015-08-24 DIAGNOSIS — Z90722 Acquired absence of ovaries, bilateral: Secondary | ICD-10-CM | POA: Diagnosis not present

## 2015-08-24 DIAGNOSIS — Z08 Encounter for follow-up examination after completed treatment for malignant neoplasm: Secondary | ICD-10-CM | POA: Diagnosis not present

## 2015-08-24 DIAGNOSIS — Z9221 Personal history of antineoplastic chemotherapy: Secondary | ICD-10-CM | POA: Diagnosis not present

## 2015-09-08 DIAGNOSIS — I10 Essential (primary) hypertension: Secondary | ICD-10-CM | POA: Diagnosis not present

## 2015-09-08 DIAGNOSIS — E038 Other specified hypothyroidism: Secondary | ICD-10-CM | POA: Diagnosis not present

## 2015-09-08 DIAGNOSIS — F418 Other specified anxiety disorders: Secondary | ICD-10-CM | POA: Diagnosis not present

## 2015-09-08 DIAGNOSIS — M1389 Other specified arthritis, multiple sites: Secondary | ICD-10-CM | POA: Diagnosis not present

## 2015-09-08 DIAGNOSIS — G59 Mononeuropathy in diseases classified elsewhere: Secondary | ICD-10-CM | POA: Diagnosis not present

## 2015-10-01 DIAGNOSIS — I10 Essential (primary) hypertension: Secondary | ICD-10-CM | POA: Diagnosis not present

## 2015-10-01 DIAGNOSIS — Z131 Encounter for screening for diabetes mellitus: Secondary | ICD-10-CM | POA: Diagnosis not present

## 2015-10-01 DIAGNOSIS — Z1389 Encounter for screening for other disorder: Secondary | ICD-10-CM | POA: Diagnosis not present

## 2015-10-01 DIAGNOSIS — Z0000 Encounter for general adult medical examination without abnormal findings: Secondary | ICD-10-CM | POA: Diagnosis not present

## 2015-10-01 DIAGNOSIS — E038 Other specified hypothyroidism: Secondary | ICD-10-CM | POA: Diagnosis not present

## 2015-10-12 ENCOUNTER — Encounter (HOSPITAL_COMMUNITY): Payer: Medicare Other | Attending: Oncology

## 2015-10-12 VITALS — BP 134/68 | HR 66 | Temp 98.5°F | Resp 20

## 2015-10-12 DIAGNOSIS — Z452 Encounter for adjustment and management of vascular access device: Secondary | ICD-10-CM

## 2015-10-12 DIAGNOSIS — C5701 Malignant neoplasm of right fallopian tube: Secondary | ICD-10-CM

## 2015-10-12 DIAGNOSIS — Z95828 Presence of other vascular implants and grafts: Secondary | ICD-10-CM

## 2015-10-12 MED ORDER — HEPARIN SOD (PORK) LOCK FLUSH 100 UNIT/ML IV SOLN
500.0000 [IU] | Freq: Once | INTRAVENOUS | Status: AC
  Administered 2015-10-12: 500 [IU] via INTRAVENOUS

## 2015-10-12 MED ORDER — SODIUM CHLORIDE 0.9% FLUSH
10.0000 mL | INTRAVENOUS | Status: DC | PRN
  Administered 2015-10-12: 10 mL via INTRAVENOUS
  Filled 2015-10-12: qty 10

## 2015-10-12 MED ORDER — HEPARIN SOD (PORK) LOCK FLUSH 100 UNIT/ML IV SOLN
INTRAVENOUS | Status: AC
  Filled 2015-10-12: qty 5

## 2015-10-12 NOTE — Patient Instructions (Signed)
[facility] at [facility] Discharge Instructions  RECOMMENDATIONS MADE BY THE CONSULTANT AND ANY TEST RESULTS WILL BE SENT TO YOUR REFERRING PHYSICIAN.  Port flushed per protocol. Follow-up as scheduled. Call clinic for any questions or concerns  Thank you for choosing [facility] at [facility] to provide your oncology and hematology care.  To afford each patient quality time with our provider, please arrive at least 15 minutes before your scheduled appointment time.   Beginning January 23rd 2017 lab work for the Cancer Center will be done in the  Main lab at [place] on 1st floor. If you have a lab appointment with the Cancer Center please come in thru the  Main Entrance and check in at the main information desk  You need to re-schedule your appointment should you arrive 10 or more minutes late.  We strive to give you quality time with our providers, and arriving late affects you and other patients whose appointments are after yours.  Also, if you no show three or more times for appointments you may be dismissed from the clinic at the providers discretion.     Again, thank you for choosing [facility].  Our hope is that these requests will decrease the amount of time that you wait before being seen by our physicians.       _____________________________________________________________  Should you have questions after your visit to [facility], please contact our office at [phone] between the hours of 8:30 a.m. and 4:30 p.m.  Voicemails left after 4:30 p.m. will not be returned until the following business day.  For prescription refill requests, have your pharmacy contact our office.         Resources For Cancer Patients and their Caregivers ? American Cancer Society: Can assist with transportation, wigs, general needs, runs Look Good Feel Better.        1-888-227-6333 ? Cancer Care: Provides financial  assistance, online support groups, medication/co-pay assistance.  1-800-813-HOPE (4673) ? [facility] Assists [place] cancer patients and their families through emotional , educational and financial support.  336-427-4357 ? [place] DSS Where to apply for food stamps, Medicaid and utility assistance. 336-342-1394 ? [facility]: Transportation to medical appointments. 336-347-2287 ? Social Security Administration: May apply for disability if have a Stage IV cancer. 336-342-7796 1-800-772-1213 ? [place] Aging, Disability and Transit Services: Assists with nutrition, care and transit needs. 336-349-2343  Cancer Center Support Programs: @10RELATIVEDAYS@ > Cancer Support Group  2nd Tuesday of the month 1pm-2pm, Journey Room  > Creative Journey  3rd Tuesday of the month 1130am-1pm, Journey Room  > Look Good Feel Better  1st Wednesday of the month 10am-12 noon, Journey Room (Call American Cancer Society to register 1-800-395-5775)   

## 2015-10-12 NOTE — Progress Notes (Signed)
Tracy Bowman tolerated port flush well without complaints or incident. Port flushed per protocol. Pt discharged in stable condition with husband's assistance

## 2015-11-02 DIAGNOSIS — M1389 Other specified arthritis, multiple sites: Secondary | ICD-10-CM | POA: Diagnosis not present

## 2015-11-02 DIAGNOSIS — G59 Mononeuropathy in diseases classified elsewhere: Secondary | ICD-10-CM | POA: Diagnosis not present

## 2015-11-02 DIAGNOSIS — F418 Other specified anxiety disorders: Secondary | ICD-10-CM | POA: Diagnosis not present

## 2015-11-02 DIAGNOSIS — E038 Other specified hypothyroidism: Secondary | ICD-10-CM | POA: Diagnosis not present

## 2015-11-02 DIAGNOSIS — I10 Essential (primary) hypertension: Secondary | ICD-10-CM | POA: Diagnosis not present

## 2015-11-04 DIAGNOSIS — E038 Other specified hypothyroidism: Secondary | ICD-10-CM | POA: Diagnosis not present

## 2015-11-04 DIAGNOSIS — M1389 Other specified arthritis, multiple sites: Secondary | ICD-10-CM | POA: Diagnosis not present

## 2015-11-04 DIAGNOSIS — G59 Mononeuropathy in diseases classified elsewhere: Secondary | ICD-10-CM | POA: Diagnosis not present

## 2015-11-04 DIAGNOSIS — I10 Essential (primary) hypertension: Secondary | ICD-10-CM | POA: Diagnosis not present

## 2015-11-04 DIAGNOSIS — F418 Other specified anxiety disorders: Secondary | ICD-10-CM | POA: Diagnosis not present

## 2015-11-10 DIAGNOSIS — Z23 Encounter for immunization: Secondary | ICD-10-CM | POA: Diagnosis not present

## 2015-11-19 ENCOUNTER — Other Ambulatory Visit (HOSPITAL_COMMUNITY): Payer: Medicare Other

## 2015-11-19 ENCOUNTER — Ambulatory Visit (HOSPITAL_COMMUNITY): Payer: Medicare Other | Admitting: Oncology

## 2015-11-23 DIAGNOSIS — Z90710 Acquired absence of both cervix and uterus: Secondary | ICD-10-CM | POA: Diagnosis not present

## 2015-11-23 DIAGNOSIS — Z9889 Other specified postprocedural states: Secondary | ICD-10-CM | POA: Diagnosis not present

## 2015-11-23 DIAGNOSIS — Z90722 Acquired absence of ovaries, bilateral: Secondary | ICD-10-CM | POA: Diagnosis not present

## 2015-11-23 DIAGNOSIS — Z8544 Personal history of malignant neoplasm of other female genital organs: Secondary | ICD-10-CM | POA: Diagnosis not present

## 2015-11-23 DIAGNOSIS — Z08 Encounter for follow-up examination after completed treatment for malignant neoplasm: Secondary | ICD-10-CM | POA: Diagnosis not present

## 2015-12-24 DIAGNOSIS — G59 Mononeuropathy in diseases classified elsewhere: Secondary | ICD-10-CM | POA: Diagnosis not present

## 2015-12-24 DIAGNOSIS — E038 Other specified hypothyroidism: Secondary | ICD-10-CM | POA: Diagnosis not present

## 2015-12-24 DIAGNOSIS — I10 Essential (primary) hypertension: Secondary | ICD-10-CM | POA: Diagnosis not present

## 2015-12-24 DIAGNOSIS — F418 Other specified anxiety disorders: Secondary | ICD-10-CM | POA: Diagnosis not present

## 2015-12-24 DIAGNOSIS — M1389 Other specified arthritis, multiple sites: Secondary | ICD-10-CM | POA: Diagnosis not present

## 2015-12-31 DIAGNOSIS — I10 Essential (primary) hypertension: Secondary | ICD-10-CM | POA: Diagnosis not present

## 2015-12-31 DIAGNOSIS — E038 Other specified hypothyroidism: Secondary | ICD-10-CM | POA: Diagnosis not present

## 2016-01-27 DIAGNOSIS — G59 Mononeuropathy in diseases classified elsewhere: Secondary | ICD-10-CM | POA: Diagnosis not present

## 2016-01-27 DIAGNOSIS — I10 Essential (primary) hypertension: Secondary | ICD-10-CM | POA: Diagnosis not present

## 2016-01-27 DIAGNOSIS — F418 Other specified anxiety disorders: Secondary | ICD-10-CM | POA: Diagnosis not present

## 2016-01-27 DIAGNOSIS — E038 Other specified hypothyroidism: Secondary | ICD-10-CM | POA: Diagnosis not present

## 2016-01-27 DIAGNOSIS — M1389 Other specified arthritis, multiple sites: Secondary | ICD-10-CM | POA: Diagnosis not present

## 2016-01-28 DIAGNOSIS — Z961 Presence of intraocular lens: Secondary | ICD-10-CM | POA: Diagnosis not present

## 2016-01-28 DIAGNOSIS — H04123 Dry eye syndrome of bilateral lacrimal glands: Secondary | ICD-10-CM | POA: Diagnosis not present

## 2016-02-09 DIAGNOSIS — H43821 Vitreomacular adhesion, right eye: Secondary | ICD-10-CM | POA: Diagnosis not present

## 2016-02-09 DIAGNOSIS — H43822 Vitreomacular adhesion, left eye: Secondary | ICD-10-CM | POA: Diagnosis not present

## 2016-02-09 DIAGNOSIS — Z961 Presence of intraocular lens: Secondary | ICD-10-CM | POA: Diagnosis not present

## 2016-02-09 DIAGNOSIS — G43109 Migraine with aura, not intractable, without status migrainosus: Secondary | ICD-10-CM | POA: Diagnosis not present

## 2016-02-22 DIAGNOSIS — C5701 Malignant neoplasm of right fallopian tube: Secondary | ICD-10-CM | POA: Diagnosis not present

## 2016-02-22 DIAGNOSIS — Z9049 Acquired absence of other specified parts of digestive tract: Secondary | ICD-10-CM | POA: Diagnosis not present

## 2016-02-22 DIAGNOSIS — Z90710 Acquired absence of both cervix and uterus: Secondary | ICD-10-CM | POA: Diagnosis not present

## 2016-02-22 DIAGNOSIS — Z9221 Personal history of antineoplastic chemotherapy: Secondary | ICD-10-CM | POA: Diagnosis not present

## 2016-02-22 DIAGNOSIS — Z9079 Acquired absence of other genital organ(s): Secondary | ICD-10-CM | POA: Diagnosis not present

## 2016-02-22 DIAGNOSIS — Z8544 Personal history of malignant neoplasm of other female genital organs: Secondary | ICD-10-CM | POA: Diagnosis not present

## 2016-02-22 DIAGNOSIS — Z90722 Acquired absence of ovaries, bilateral: Secondary | ICD-10-CM | POA: Diagnosis not present

## 2016-03-15 DIAGNOSIS — I10 Essential (primary) hypertension: Secondary | ICD-10-CM | POA: Diagnosis not present

## 2016-03-15 DIAGNOSIS — F338 Other recurrent depressive disorders: Secondary | ICD-10-CM | POA: Diagnosis not present

## 2016-03-15 DIAGNOSIS — E038 Other specified hypothyroidism: Secondary | ICD-10-CM | POA: Diagnosis not present

## 2016-03-28 ENCOUNTER — Ambulatory Visit (INDEPENDENT_AMBULATORY_CARE_PROVIDER_SITE_OTHER): Payer: Medicare Other | Admitting: Cardiovascular Disease

## 2016-03-28 ENCOUNTER — Encounter: Payer: Self-pay | Admitting: Cardiovascular Disease

## 2016-03-28 VITALS — BP 132/64 | HR 62 | Ht 66.0 in | Wt 174.0 lb

## 2016-03-28 DIAGNOSIS — I482 Chronic atrial fibrillation, unspecified: Secondary | ICD-10-CM

## 2016-03-28 DIAGNOSIS — I10 Essential (primary) hypertension: Secondary | ICD-10-CM

## 2016-03-28 DIAGNOSIS — I83893 Varicose veins of bilateral lower extremities with other complications: Secondary | ICD-10-CM | POA: Diagnosis not present

## 2016-03-28 NOTE — Progress Notes (Signed)
SUBJECTIVE: Tracy Bowman returns for follow-up of chronic atrial fibrillation. Echocardiogram was performed at [facility] on 04/16/14 and demonstrated normal left ventricular systolic function, EF 17-79%, diastolic dysfunction, moderate left atrial dilatation, moderate right atrial dilatation, mild aortic sclerosis, mild mitral regurgitation, moderate tricuspid regurgitation with RVSP 45 mmHg, and moderate concentric LVH.   ECG today shows rate controlled atrial fibrillation.  She tells me she fell and broke her pelvis last year and also had pneumonia. She also said she was told she is cancer free. She said she had a sharp chest pain last week lasting seconds. These occur about once a month.   Soc: Her husband, Francee Piccolo, is also a patient of mine.    Review of Systems: As per "subjective", otherwise negative.  Allergies  Allergen Reactions  . Morphine Other (See Comments)    Current Outpatient Prescriptions  Medication Sig Dispense Refill  . acetaminophen (TYLENOL) 325 MG tablet Take 650 mg by mouth every 6 (six) hours as needed.    . ALPRAZolam (XANAX) 0.5 MG tablet Take 1 tablet by mouth at bedtime as needed.   0  . Cholecalciferol (VITAMIN D3) 2000 UNITS TABS Take 1 tablet by mouth daily.    Marland Kitchen docusate sodium (COLACE) 100 MG capsule Take 100 mg by mouth 2 (two) times daily as needed.    . furosemide (LASIX) 20 MG tablet Take 20 mg by mouth.    . levothyroxine (SYNTHROID, LEVOTHROID) 25 MCG tablet Take by mouth.    . metoprolol succinate (TOPROL-XL) 25 MG 24 hr tablet Take 25 mg by mouth daily.    . mirtazapine (REMERON) 15 MG tablet Take by mouth.    . Multiple Vitamins tablet Take 1 tablet by mouth daily.    . Omega-3 Fatty Acids (FISH OIL) 1000 MG CAPS Take 1 capsule by mouth 2 (two) times daily.    . traMADol (ULTRAM) 50 MG tablet Take 1 tablet by mouth every 8 (eight) hours as needed.  0   No current facility-administered medications for this visit.     Past  Medical History:  Diagnosis Date  . Anxiety neurosis   . Hyperlipidemia     No past surgical history on file.  Social History   Social History  . Marital status: Married    Spouse name: N/A  . Number of children: N/A  . Years of education: N/A   Occupational History  . Not on file.   Social History Main Topics  . Smoking status: Never Smoker  . Smokeless tobacco: Never Used  . Alcohol use No  . Drug use: No  . Sexual activity: No   Other Topics Concern  . Not on file   Social History Narrative  . No narrative on file     Vitals:   03/28/16 1254  BP: 132/64  Pulse: 62  SpO2: 94%  Weight: 174 lb (78.9 kg)  Height: 5\' 6"  (1.676 m)    PHYSICAL EXAM General: NAD HEENT: Normal. Neck: No JVD, no thyromegaly. Lungs: Clear to auscultation bilaterally with normal respiratory effort. CV: Irregular rhythm, normal rate, normal S1/S2, no S3, 2/6 pansystolic murmur along left sternal border. No pretibial or periankle edema. No carotid bruits. Abdomen: Soft, nontender, no distention.  Neurologic: Alert and oriented x 3.  Psych: Normal affect. Skin: Normal. Musculoskeletal: No gross deformities. Extremities: No clubbing or cyanosis.    ECG: Most recent ECG reviewed.      ASSESSMENT AND PLAN:  1. Chronic atrial fibrillation: Symptomatically  stable. She has moderate left atrial enlargement. Her HR is currently controlled on Toprol-XL 25 mg. CHADSVASC score is sufficiently high enough to warrant anticoagulation therapy. However, she was reluctant to take Xarelto 15 mg daily. Given her history of falls, this is likely the most prudent strategy.  2. Essential HTN: Well controlled today. No changes to meds.  3. Bilateral leg swelling: Due to chronic venous insufficiency. Continue compression stockings.    Dispo: f/u 1 year.  Kate Sable, M.D., F.A.C.C.

## 2016-03-28 NOTE — Patient Instructions (Signed)
Your physician wants you to follow-up in: [place]. You will receive a reminder letter in the mail two months in advance. If you don't receive a letter, please call our office to schedule the follow-up appointment.  Your physician recommends that you continue on your current medications as directed. Please refer to the Current Medication list given to you today.

## 2016-04-07 DIAGNOSIS — F418 Other specified anxiety disorders: Secondary | ICD-10-CM | POA: Diagnosis not present

## 2016-04-07 DIAGNOSIS — G59 Mononeuropathy in diseases classified elsewhere: Secondary | ICD-10-CM | POA: Diagnosis not present

## 2016-04-07 DIAGNOSIS — E038 Other specified hypothyroidism: Secondary | ICD-10-CM | POA: Diagnosis not present

## 2016-04-07 DIAGNOSIS — I10 Essential (primary) hypertension: Secondary | ICD-10-CM | POA: Diagnosis not present

## 2016-05-04 DIAGNOSIS — I10 Essential (primary) hypertension: Secondary | ICD-10-CM | POA: Diagnosis not present

## 2016-05-04 DIAGNOSIS — E038 Other specified hypothyroidism: Secondary | ICD-10-CM | POA: Diagnosis not present

## 2016-05-04 DIAGNOSIS — G59 Mononeuropathy in diseases classified elsewhere: Secondary | ICD-10-CM | POA: Diagnosis not present

## 2016-05-04 DIAGNOSIS — F418 Other specified anxiety disorders: Secondary | ICD-10-CM | POA: Diagnosis not present

## 2016-05-23 DIAGNOSIS — Z8544 Personal history of malignant neoplasm of other female genital organs: Secondary | ICD-10-CM | POA: Diagnosis not present

## 2016-05-23 DIAGNOSIS — C5701 Malignant neoplasm of right fallopian tube: Secondary | ICD-10-CM | POA: Diagnosis not present

## 2016-05-31 DIAGNOSIS — Z452 Encounter for adjustment and management of vascular access device: Secondary | ICD-10-CM | POA: Diagnosis not present

## 2016-05-31 DIAGNOSIS — C5701 Malignant neoplasm of right fallopian tube: Secondary | ICD-10-CM | POA: Diagnosis not present

## 2016-06-16 DIAGNOSIS — Z79899 Other long term (current) drug therapy: Secondary | ICD-10-CM | POA: Diagnosis not present

## 2016-06-16 DIAGNOSIS — R5383 Other fatigue: Secondary | ICD-10-CM | POA: Diagnosis not present

## 2016-06-16 DIAGNOSIS — E038 Other specified hypothyroidism: Secondary | ICD-10-CM | POA: Diagnosis not present

## 2016-06-16 DIAGNOSIS — F338 Other recurrent depressive disorders: Secondary | ICD-10-CM | POA: Diagnosis not present

## 2016-06-16 DIAGNOSIS — I10 Essential (primary) hypertension: Secondary | ICD-10-CM | POA: Diagnosis not present

## 2016-06-27 DIAGNOSIS — I10 Essential (primary) hypertension: Secondary | ICD-10-CM | POA: Diagnosis not present

## 2016-06-27 DIAGNOSIS — G59 Mononeuropathy in diseases classified elsewhere: Secondary | ICD-10-CM | POA: Diagnosis not present

## 2016-06-27 DIAGNOSIS — F418 Other specified anxiety disorders: Secondary | ICD-10-CM | POA: Diagnosis not present

## 2016-06-27 DIAGNOSIS — E038 Other specified hypothyroidism: Secondary | ICD-10-CM | POA: Diagnosis not present

## 2016-08-16 DIAGNOSIS — Z1231 Encounter for screening mammogram for malignant neoplasm of breast: Secondary | ICD-10-CM | POA: Diagnosis not present

## 2016-08-16 DIAGNOSIS — G59 Mononeuropathy in diseases classified elsewhere: Secondary | ICD-10-CM | POA: Diagnosis not present

## 2016-08-16 DIAGNOSIS — E038 Other specified hypothyroidism: Secondary | ICD-10-CM | POA: Diagnosis not present

## 2016-08-16 DIAGNOSIS — I10 Essential (primary) hypertension: Secondary | ICD-10-CM | POA: Diagnosis not present

## 2016-08-16 DIAGNOSIS — F418 Other specified anxiety disorders: Secondary | ICD-10-CM | POA: Diagnosis not present

## 2016-09-08 DIAGNOSIS — M810 Age-related osteoporosis without current pathological fracture: Secondary | ICD-10-CM | POA: Diagnosis not present

## 2016-09-08 DIAGNOSIS — M80051A Age-related osteoporosis with current pathological fracture, right femur, initial encounter for fracture: Secondary | ICD-10-CM | POA: Diagnosis not present

## 2016-09-08 DIAGNOSIS — Z79899 Other long term (current) drug therapy: Secondary | ICD-10-CM | POA: Diagnosis not present

## 2016-09-08 DIAGNOSIS — R5383 Other fatigue: Secondary | ICD-10-CM | POA: Diagnosis not present

## 2016-09-08 DIAGNOSIS — R0602 Shortness of breath: Secondary | ICD-10-CM | POA: Diagnosis not present

## 2016-09-08 DIAGNOSIS — I10 Essential (primary) hypertension: Secondary | ICD-10-CM | POA: Diagnosis not present

## 2016-09-08 DIAGNOSIS — E038 Other specified hypothyroidism: Secondary | ICD-10-CM | POA: Diagnosis not present

## 2016-09-12 DIAGNOSIS — F418 Other specified anxiety disorders: Secondary | ICD-10-CM | POA: Diagnosis not present

## 2016-09-12 DIAGNOSIS — I10 Essential (primary) hypertension: Secondary | ICD-10-CM | POA: Diagnosis not present

## 2016-09-12 DIAGNOSIS — E038 Other specified hypothyroidism: Secondary | ICD-10-CM | POA: Diagnosis not present

## 2016-09-12 DIAGNOSIS — G59 Mononeuropathy in diseases classified elsewhere: Secondary | ICD-10-CM | POA: Diagnosis not present

## 2016-09-12 DIAGNOSIS — M1389 Other specified arthritis, multiple sites: Secondary | ICD-10-CM | POA: Diagnosis not present

## 2016-09-22 DIAGNOSIS — F330 Major depressive disorder, recurrent, mild: Secondary | ICD-10-CM | POA: Diagnosis not present

## 2016-09-22 DIAGNOSIS — E038 Other specified hypothyroidism: Secondary | ICD-10-CM | POA: Diagnosis not present

## 2016-09-22 DIAGNOSIS — I10 Essential (primary) hypertension: Secondary | ICD-10-CM | POA: Diagnosis not present

## 2016-09-22 DIAGNOSIS — M80051A Age-related osteoporosis with current pathological fracture, right femur, initial encounter for fracture: Secondary | ICD-10-CM | POA: Diagnosis not present

## 2016-10-31 DIAGNOSIS — E038 Other specified hypothyroidism: Secondary | ICD-10-CM | POA: Diagnosis not present

## 2016-10-31 DIAGNOSIS — M80051A Age-related osteoporosis with current pathological fracture, right femur, initial encounter for fracture: Secondary | ICD-10-CM | POA: Diagnosis not present

## 2016-10-31 DIAGNOSIS — I10 Essential (primary) hypertension: Secondary | ICD-10-CM | POA: Diagnosis not present

## 2016-11-21 DIAGNOSIS — Z08 Encounter for follow-up examination after completed treatment for malignant neoplasm: Secondary | ICD-10-CM | POA: Diagnosis not present

## 2016-11-21 DIAGNOSIS — Z9079 Acquired absence of other genital organ(s): Secondary | ICD-10-CM | POA: Diagnosis not present

## 2016-11-21 DIAGNOSIS — Z90710 Acquired absence of both cervix and uterus: Secondary | ICD-10-CM | POA: Diagnosis not present

## 2016-11-21 DIAGNOSIS — Z8544 Personal history of malignant neoplasm of other female genital organs: Secondary | ICD-10-CM | POA: Diagnosis not present

## 2016-11-21 DIAGNOSIS — Z90722 Acquired absence of ovaries, bilateral: Secondary | ICD-10-CM | POA: Diagnosis not present

## 2016-11-21 DIAGNOSIS — Z9049 Acquired absence of other specified parts of digestive tract: Secondary | ICD-10-CM | POA: Diagnosis not present

## 2016-11-21 DIAGNOSIS — Z23 Encounter for immunization: Secondary | ICD-10-CM | POA: Diagnosis not present

## 2016-11-21 DIAGNOSIS — Z9221 Personal history of antineoplastic chemotherapy: Secondary | ICD-10-CM | POA: Diagnosis not present

## 2016-12-01 DIAGNOSIS — E038 Other specified hypothyroidism: Secondary | ICD-10-CM | POA: Diagnosis not present

## 2016-12-01 DIAGNOSIS — I10 Essential (primary) hypertension: Secondary | ICD-10-CM | POA: Diagnosis not present

## 2016-12-01 DIAGNOSIS — F330 Major depressive disorder, recurrent, mild: Secondary | ICD-10-CM | POA: Diagnosis not present

## 2016-12-15 DIAGNOSIS — I10 Essential (primary) hypertension: Secondary | ICD-10-CM | POA: Diagnosis not present

## 2016-12-15 DIAGNOSIS — F330 Major depressive disorder, recurrent, mild: Secondary | ICD-10-CM | POA: Diagnosis not present

## 2016-12-15 DIAGNOSIS — E038 Other specified hypothyroidism: Secondary | ICD-10-CM | POA: Diagnosis not present

## 2016-12-22 DIAGNOSIS — M810 Age-related osteoporosis without current pathological fracture: Secondary | ICD-10-CM | POA: Diagnosis not present

## 2016-12-22 DIAGNOSIS — I10 Essential (primary) hypertension: Secondary | ICD-10-CM | POA: Diagnosis not present

## 2016-12-22 DIAGNOSIS — E038 Other specified hypothyroidism: Secondary | ICD-10-CM | POA: Diagnosis not present

## 2016-12-22 DIAGNOSIS — F330 Major depressive disorder, recurrent, mild: Secondary | ICD-10-CM | POA: Diagnosis not present

## 2017-02-07 DIAGNOSIS — I10 Essential (primary) hypertension: Secondary | ICD-10-CM | POA: Diagnosis not present

## 2017-02-07 DIAGNOSIS — E038 Other specified hypothyroidism: Secondary | ICD-10-CM | POA: Diagnosis not present

## 2017-03-07 DIAGNOSIS — I10 Essential (primary) hypertension: Secondary | ICD-10-CM | POA: Diagnosis not present

## 2017-03-07 DIAGNOSIS — E038 Other specified hypothyroidism: Secondary | ICD-10-CM | POA: Diagnosis not present

## 2017-03-22 DIAGNOSIS — M810 Age-related osteoporosis without current pathological fracture: Secondary | ICD-10-CM | POA: Diagnosis not present

## 2017-03-22 DIAGNOSIS — R0602 Shortness of breath: Secondary | ICD-10-CM | POA: Diagnosis not present

## 2017-03-22 DIAGNOSIS — F330 Major depressive disorder, recurrent, mild: Secondary | ICD-10-CM | POA: Diagnosis not present

## 2017-03-22 DIAGNOSIS — E038 Other specified hypothyroidism: Secondary | ICD-10-CM | POA: Diagnosis not present

## 2017-03-22 DIAGNOSIS — I10 Essential (primary) hypertension: Secondary | ICD-10-CM | POA: Diagnosis not present

## 2017-03-22 DIAGNOSIS — I872 Venous insufficiency (chronic) (peripheral): Secondary | ICD-10-CM | POA: Diagnosis not present

## 2017-03-27 DIAGNOSIS — R600 Localized edema: Secondary | ICD-10-CM | POA: Diagnosis not present

## 2017-03-27 DIAGNOSIS — I872 Venous insufficiency (chronic) (peripheral): Secondary | ICD-10-CM | POA: Diagnosis not present

## 2017-04-05 DIAGNOSIS — F330 Major depressive disorder, recurrent, mild: Secondary | ICD-10-CM | POA: Diagnosis not present

## 2017-04-05 DIAGNOSIS — E038 Other specified hypothyroidism: Secondary | ICD-10-CM | POA: Diagnosis not present

## 2017-04-05 DIAGNOSIS — I10 Essential (primary) hypertension: Secondary | ICD-10-CM | POA: Diagnosis not present

## 2017-04-05 DIAGNOSIS — M810 Age-related osteoporosis without current pathological fracture: Secondary | ICD-10-CM | POA: Diagnosis not present

## 2017-05-10 DIAGNOSIS — F330 Major depressive disorder, recurrent, mild: Secondary | ICD-10-CM | POA: Diagnosis not present

## 2017-05-10 DIAGNOSIS — I10 Essential (primary) hypertension: Secondary | ICD-10-CM | POA: Diagnosis not present

## 2017-05-10 DIAGNOSIS — E038 Other specified hypothyroidism: Secondary | ICD-10-CM | POA: Diagnosis not present

## 2017-05-10 DIAGNOSIS — M810 Age-related osteoporosis without current pathological fracture: Secondary | ICD-10-CM | POA: Diagnosis not present

## 2017-05-16 ENCOUNTER — Other Ambulatory Visit: Payer: Self-pay

## 2017-05-16 ENCOUNTER — Ambulatory Visit (INDEPENDENT_AMBULATORY_CARE_PROVIDER_SITE_OTHER): Payer: Medicare Other | Admitting: Cardiovascular Disease

## 2017-05-16 ENCOUNTER — Encounter

## 2017-05-16 ENCOUNTER — Encounter: Payer: Self-pay | Admitting: Cardiovascular Disease

## 2017-05-16 VITALS — BP 124/79 | HR 63 | Ht 66.0 in | Wt 176.0 lb

## 2017-05-16 DIAGNOSIS — I83893 Varicose veins of bilateral lower extremities with other complications: Secondary | ICD-10-CM | POA: Diagnosis not present

## 2017-05-16 DIAGNOSIS — I10 Essential (primary) hypertension: Secondary | ICD-10-CM

## 2017-05-16 DIAGNOSIS — I482 Chronic atrial fibrillation: Secondary | ICD-10-CM | POA: Diagnosis not present

## 2017-05-16 DIAGNOSIS — I4821 Permanent atrial fibrillation: Secondary | ICD-10-CM

## 2017-05-16 NOTE — Patient Instructions (Signed)

## 2017-05-16 NOTE — Progress Notes (Signed)
SUBJECTIVE: Tracy Bowman returns for follow-up of chronic atrial fibrillation.  Past medical history also includes right-sided cancer of the fallopian tube status post surgery followed by adjuvant chemotherapy.  Echocardiogram was performed at [facility] on 04/16/14 and demonstrated normal left ventricular systolic function, EF 02-72%, diastolic dysfunction, moderate left atrial dilatation, moderate right atrial dilatation, mild aortic sclerosis, mild mitral regurgitation, moderate tricuspid regurgitation with RVSP 45 mmHg, and moderate concentric LVH.   She is doing well overall.  She seldom has upper left and upper right sided chest pains not necessarily associated with exertion.  She denies shortness of breath and palpitations.  She denies lightheadedness, dizziness, syncope, orthopnea, and paroxysmal nocturnal dyspnea.  She is here with her husband.  She gave me an extensive description about her surgery several years ago for cancer and diverticulitis.    Soc: Her husband, Francee Piccolo, is also a patient of mine.   They will have been married 44 years on June 03, 2017.   Review of Systems: As per "subjective", otherwise negative.  Allergies  Allergen Reactions  . Morphine Other (See Comments)    Current Outpatient Medications  Medication Sig Dispense Refill  . acetaminophen (TYLENOL) 325 MG tablet Take 650 mg by mouth every 6 (six) hours as needed.    Marland Kitchen alendronate (FOSAMAX) 70 MG tablet PLEASE SEE ATTACHED FOR DETAILED DIRECTIONS  0  . ALPRAZolam (XANAX) 0.5 MG tablet Take 1 tablet by mouth at bedtime as needed.   0  . diltiazem (TIAZAC) 120 MG 24 hr capsule Take 120 mg by mouth daily.  0  . furosemide (LASIX) 20 MG tablet Take 20 mg by mouth.    . levothyroxine (SYNTHROID, LEVOTHROID) 25 MCG tablet Take by mouth.    . Magnesium 200 MG TABS Take 1 tablet by mouth daily.  0  . metoprolol succinate (TOPROL-XL) 25 MG 24 hr tablet Take 25 mg by mouth daily.    . mirtazapine  (REMERON) 15 MG tablet Take by mouth.    . Multiple Vitamins tablet Take 1 tablet by mouth daily.    . Omega-3 Fatty Acids (FISH OIL) 1000 MG CAPS Take 1 capsule by mouth 2 (two) times daily.    . sertraline (ZOLOFT) 50 MG tablet Take 50 mg by mouth daily.  0  . traMADol (ULTRAM) 50 MG tablet Take 1 tablet by mouth every 8 (eight) hours as needed.  0   No current facility-administered medications for this visit.     Past Medical History:  Diagnosis Date  . Anxiety neurosis   . Hyperlipidemia     No past surgical history on file.  Social History   Socioeconomic History  . Marital status: Married    Spouse name: Not on file  . Number of children: Not on file  . Years of education: Not on file  . Highest education level: Not on file  Occupational History  . Not on file  Social Needs  . Financial resource strain: Not on file  . Food insecurity:    Worry: Not on file    Inability: Not on file  . Transportation needs:    Medical: Not on file    Non-medical: Not on file  Tobacco Use  . Smoking status: Never Smoker  . Smokeless tobacco: Never Used  Substance and Sexual Activity  . Alcohol use: No    Alcohol/week: 0.0 oz  . Drug use: No  . Sexual activity: Never  Lifestyle  . Physical activity:  Days per week: Not on file    Minutes per session: Not on file  . Stress: Not on file  Relationships  . Social connections:    Talks on phone: Not on file    Gets together: Not on file    Attends religious service: Not on file    Active member of club or organization: Not on file    Attends meetings of clubs or organizations: Not on file    Relationship status: Not on file  . Intimate partner violence:    Fear of current or ex partner: Not on file    Emotionally abused: Not on file    Physically abused: Not on file    Forced sexual activity: Not on file  Other Topics Concern  . Not on file  Social History Narrative  . Not on file     Vitals:   05/16/17 1501  BP:  124/79  Pulse: 63  SpO2: 95%  Weight: 176 lb (79.8 kg)  Height: 5\' 6"  (1.676 m)    Wt Readings from Last 3 Encounters:  05/16/17 176 lb (79.8 kg)  03/28/16 174 lb (78.9 kg)  08/19/15 175 lb 6.4 oz (79.6 kg)     PHYSICAL EXAM General: NAD HEENT: Normal. Neck: No JVD, no thyromegaly. Lungs: Clear to auscultation bilaterally with normal respiratory effort. CV: Regular rate and irregular rhythm, normal S1/S2, no S3, 2/6 pansystolic murmur along left sternal border.  Trace bilateral lower extremity edema.  No carotid bruit.   Abdomen: Soft, nontender, no distention.  Neurologic: Alert and oriented.  Psych: Normal affect. Skin: Normal. Musculoskeletal: No gross deformities.    ECG: Most recent ECG reviewed.   Labs: Lab Results  Component Value Date/Time   K 3.7 08/19/2015 04:00 PM   BUN 18 08/19/2015 04:00 PM   CREATININE 0.69 08/19/2015 04:00 PM   ALT 20 08/19/2015 04:00 PM   HGB 13.8 08/19/2015 04:00 PM     Lipids: No results found for: LDLCALC, LDLDIRECT, CHOL, TRIG, HDL     ASSESSMENT AND PLAN:  1. Permanent atrial fibrillation: Symptomatically stable. She has moderate left atrial enlargement. Her HR is currently controlled on Toprol-XL 25 mg. CHADSVASC score is sufficiently high enough to warrant anticoagulation therapy. However, she was reluctant to take Xarelto 15 mg daily. Given her history of falls, this is likely the most prudent strategy.  2. Essential HTN: Well controlled today. No changes to meds.  3. Bilateral leg swelling: Due to chronic venous insufficiency. Continue compression stockings.      Disposition: Follow up 1 year   Kate Sable, M.D., F.A.C.C.

## 2017-05-22 DIAGNOSIS — E039 Hypothyroidism, unspecified: Secondary | ICD-10-CM | POA: Diagnosis not present

## 2017-05-22 DIAGNOSIS — Z08 Encounter for follow-up examination after completed treatment for malignant neoplasm: Secondary | ICD-10-CM | POA: Diagnosis not present

## 2017-05-22 DIAGNOSIS — Z8544 Personal history of malignant neoplasm of other female genital organs: Secondary | ICD-10-CM | POA: Diagnosis not present

## 2017-05-24 DIAGNOSIS — M810 Age-related osteoporosis without current pathological fracture: Secondary | ICD-10-CM | POA: Diagnosis not present

## 2017-05-24 DIAGNOSIS — I10 Essential (primary) hypertension: Secondary | ICD-10-CM | POA: Diagnosis not present

## 2017-05-24 DIAGNOSIS — F330 Major depressive disorder, recurrent, mild: Secondary | ICD-10-CM | POA: Diagnosis not present

## 2017-05-24 DIAGNOSIS — E038 Other specified hypothyroidism: Secondary | ICD-10-CM | POA: Diagnosis not present

## 2017-06-20 DIAGNOSIS — H8143 Vertigo of central origin, bilateral: Secondary | ICD-10-CM | POA: Diagnosis not present

## 2017-06-27 DIAGNOSIS — H00014 Hordeolum externum left upper eyelid: Secondary | ICD-10-CM | POA: Diagnosis not present

## 2017-06-27 DIAGNOSIS — Z961 Presence of intraocular lens: Secondary | ICD-10-CM | POA: Diagnosis not present

## 2017-07-28 DIAGNOSIS — M1389 Other specified arthritis, multiple sites: Secondary | ICD-10-CM | POA: Diagnosis not present

## 2017-07-28 DIAGNOSIS — I10 Essential (primary) hypertension: Secondary | ICD-10-CM | POA: Diagnosis not present

## 2017-07-28 DIAGNOSIS — F418 Other specified anxiety disorders: Secondary | ICD-10-CM | POA: Diagnosis not present

## 2017-07-28 DIAGNOSIS — E038 Other specified hypothyroidism: Secondary | ICD-10-CM | POA: Diagnosis not present

## 2017-07-28 DIAGNOSIS — G59 Mononeuropathy in diseases classified elsewhere: Secondary | ICD-10-CM | POA: Diagnosis not present

## 2017-07-31 DIAGNOSIS — H524 Presbyopia: Secondary | ICD-10-CM | POA: Diagnosis not present

## 2017-07-31 DIAGNOSIS — H52223 Regular astigmatism, bilateral: Secondary | ICD-10-CM | POA: Diagnosis not present

## 2017-07-31 DIAGNOSIS — H5203 Hypermetropia, bilateral: Secondary | ICD-10-CM | POA: Diagnosis not present

## 2017-07-31 DIAGNOSIS — Z9849 Cataract extraction status, unspecified eye: Secondary | ICD-10-CM | POA: Diagnosis not present

## 2017-07-31 DIAGNOSIS — Z961 Presence of intraocular lens: Secondary | ICD-10-CM | POA: Diagnosis not present

## 2017-08-21 DIAGNOSIS — F418 Other specified anxiety disorders: Secondary | ICD-10-CM | POA: Diagnosis not present

## 2017-08-21 DIAGNOSIS — E038 Other specified hypothyroidism: Secondary | ICD-10-CM | POA: Diagnosis not present

## 2017-08-21 DIAGNOSIS — I10 Essential (primary) hypertension: Secondary | ICD-10-CM | POA: Diagnosis not present

## 2017-08-21 DIAGNOSIS — G59 Mononeuropathy in diseases classified elsewhere: Secondary | ICD-10-CM | POA: Diagnosis not present

## 2017-08-21 DIAGNOSIS — M1389 Other specified arthritis, multiple sites: Secondary | ICD-10-CM | POA: Diagnosis not present

## 2017-08-28 DIAGNOSIS — R110 Nausea: Secondary | ICD-10-CM | POA: Diagnosis not present

## 2017-08-28 DIAGNOSIS — F418 Other specified anxiety disorders: Secondary | ICD-10-CM | POA: Diagnosis not present

## 2017-08-28 DIAGNOSIS — M6281 Muscle weakness (generalized): Secondary | ICD-10-CM | POA: Diagnosis not present

## 2017-08-28 DIAGNOSIS — S72141A Displaced intertrochanteric fracture of right femur, initial encounter for closed fracture: Secondary | ICD-10-CM | POA: Diagnosis not present

## 2017-08-28 DIAGNOSIS — Z7982 Long term (current) use of aspirin: Secondary | ICD-10-CM | POA: Diagnosis not present

## 2017-08-28 DIAGNOSIS — S299XXA Unspecified injury of thorax, initial encounter: Secondary | ICD-10-CM | POA: Diagnosis not present

## 2017-08-28 DIAGNOSIS — W19XXXD Unspecified fall, subsequent encounter: Secondary | ICD-10-CM | POA: Diagnosis not present

## 2017-08-28 DIAGNOSIS — F419 Anxiety disorder, unspecified: Secondary | ICD-10-CM | POA: Diagnosis not present

## 2017-08-28 DIAGNOSIS — W08XXXA Fall from other furniture, initial encounter: Secondary | ICD-10-CM | POA: Diagnosis not present

## 2017-08-28 DIAGNOSIS — Z79899 Other long term (current) drug therapy: Secondary | ICD-10-CM | POA: Diagnosis not present

## 2017-08-28 DIAGNOSIS — N390 Urinary tract infection, site not specified: Secondary | ICD-10-CM | POA: Diagnosis present

## 2017-08-28 DIAGNOSIS — S72144A Nondisplaced intertrochanteric fracture of right femur, initial encounter for closed fracture: Secondary | ICD-10-CM | POA: Diagnosis not present

## 2017-08-28 DIAGNOSIS — B9620 Unspecified Escherichia coli [E. coli] as the cause of diseases classified elsewhere: Secondary | ICD-10-CM | POA: Diagnosis present

## 2017-08-28 DIAGNOSIS — S72001D Fracture of unspecified part of neck of right femur, subsequent encounter for closed fracture with routine healing: Secondary | ICD-10-CM | POA: Diagnosis not present

## 2017-08-28 DIAGNOSIS — Z8542 Personal history of malignant neoplasm of other parts of uterus: Secondary | ICD-10-CM | POA: Diagnosis not present

## 2017-08-28 DIAGNOSIS — F329 Major depressive disorder, single episode, unspecified: Secondary | ICD-10-CM | POA: Diagnosis not present

## 2017-08-28 DIAGNOSIS — T45515A Adverse effect of anticoagulants, initial encounter: Secondary | ICD-10-CM | POA: Diagnosis not present

## 2017-08-28 DIAGNOSIS — I482 Chronic atrial fibrillation: Secondary | ICD-10-CM | POA: Diagnosis present

## 2017-08-28 DIAGNOSIS — E039 Hypothyroidism, unspecified: Secondary | ICD-10-CM | POA: Diagnosis present

## 2017-08-28 DIAGNOSIS — R42 Dizziness and giddiness: Secondary | ICD-10-CM | POA: Diagnosis not present

## 2017-08-28 DIAGNOSIS — W19XXXA Unspecified fall, initial encounter: Secondary | ICD-10-CM | POA: Diagnosis not present

## 2017-08-28 DIAGNOSIS — D62 Acute posthemorrhagic anemia: Secondary | ICD-10-CM | POA: Diagnosis not present

## 2017-08-28 DIAGNOSIS — R2689 Other abnormalities of gait and mobility: Secondary | ICD-10-CM | POA: Diagnosis not present

## 2017-08-28 DIAGNOSIS — G908 Other disorders of autonomic nervous system: Secondary | ICD-10-CM | POA: Diagnosis present

## 2017-08-28 DIAGNOSIS — Z85038 Personal history of other malignant neoplasm of large intestine: Secondary | ICD-10-CM | POA: Diagnosis not present

## 2017-08-28 DIAGNOSIS — S72141D Displaced intertrochanteric fracture of right femur, subsequent encounter for closed fracture with routine healing: Secondary | ICD-10-CM | POA: Diagnosis not present

## 2017-09-02 DIAGNOSIS — D62 Acute posthemorrhagic anemia: Secondary | ICD-10-CM | POA: Diagnosis not present

## 2017-09-02 DIAGNOSIS — F419 Anxiety disorder, unspecified: Secondary | ICD-10-CM | POA: Diagnosis not present

## 2017-09-02 DIAGNOSIS — R2689 Other abnormalities of gait and mobility: Secondary | ICD-10-CM | POA: Diagnosis not present

## 2017-09-02 DIAGNOSIS — F329 Major depressive disorder, single episode, unspecified: Secondary | ICD-10-CM | POA: Diagnosis not present

## 2017-09-02 DIAGNOSIS — N390 Urinary tract infection, site not specified: Secondary | ICD-10-CM | POA: Diagnosis not present

## 2017-09-02 DIAGNOSIS — S7291XA Unspecified fracture of right femur, initial encounter for closed fracture: Secondary | ICD-10-CM | POA: Diagnosis not present

## 2017-09-02 DIAGNOSIS — M6281 Muscle weakness (generalized): Secondary | ICD-10-CM | POA: Diagnosis not present

## 2017-09-02 DIAGNOSIS — W19XXXD Unspecified fall, subsequent encounter: Secondary | ICD-10-CM | POA: Diagnosis not present

## 2017-09-02 DIAGNOSIS — B9620 Unspecified Escherichia coli [E. coli] as the cause of diseases classified elsewhere: Secondary | ICD-10-CM | POA: Diagnosis not present

## 2017-09-02 DIAGNOSIS — D6489 Other specified anemias: Secondary | ICD-10-CM | POA: Diagnosis not present

## 2017-09-02 DIAGNOSIS — S72141A Displaced intertrochanteric fracture of right femur, initial encounter for closed fracture: Secondary | ICD-10-CM | POA: Diagnosis not present

## 2017-09-02 DIAGNOSIS — W19XXXA Unspecified fall, initial encounter: Secondary | ICD-10-CM | POA: Diagnosis not present

## 2017-09-02 DIAGNOSIS — F338 Other recurrent depressive disorders: Secondary | ICD-10-CM | POA: Diagnosis not present

## 2017-09-02 DIAGNOSIS — S72001D Fracture of unspecified part of neck of right femur, subsequent encounter for closed fracture with routine healing: Secondary | ICD-10-CM | POA: Diagnosis not present

## 2017-09-03 DIAGNOSIS — D6489 Other specified anemias: Secondary | ICD-10-CM | POA: Diagnosis not present

## 2017-09-03 DIAGNOSIS — S7291XA Unspecified fracture of right femur, initial encounter for closed fracture: Secondary | ICD-10-CM | POA: Diagnosis not present

## 2017-09-03 DIAGNOSIS — F338 Other recurrent depressive disorders: Secondary | ICD-10-CM | POA: Diagnosis not present

## 2017-09-03 DIAGNOSIS — N390 Urinary tract infection, site not specified: Secondary | ICD-10-CM | POA: Diagnosis not present

## 2017-09-11 DIAGNOSIS — S72141A Displaced intertrochanteric fracture of right femur, initial encounter for closed fracture: Secondary | ICD-10-CM | POA: Diagnosis not present

## 2017-10-05 DIAGNOSIS — Z87440 Personal history of urinary (tract) infections: Secondary | ICD-10-CM | POA: Diagnosis not present

## 2017-10-05 DIAGNOSIS — Z8542 Personal history of malignant neoplasm of other parts of uterus: Secondary | ICD-10-CM | POA: Diagnosis not present

## 2017-10-05 DIAGNOSIS — F418 Other specified anxiety disorders: Secondary | ICD-10-CM | POA: Diagnosis not present

## 2017-10-05 DIAGNOSIS — S72141D Displaced intertrochanteric fracture of right femur, subsequent encounter for closed fracture with routine healing: Secondary | ICD-10-CM | POA: Diagnosis not present

## 2017-10-05 DIAGNOSIS — G909 Disorder of the autonomic nervous system, unspecified: Secondary | ICD-10-CM | POA: Diagnosis not present

## 2017-10-05 DIAGNOSIS — D649 Anemia, unspecified: Secondary | ICD-10-CM | POA: Diagnosis not present

## 2017-10-06 ENCOUNTER — Inpatient Hospital Stay (HOSPITAL_COMMUNITY)
Admission: AD | Admit: 2017-10-06 | Discharge: 2017-10-11 | DRG: 481 | Disposition: A | Discharge status: Skilled Nursing Facility | Payer: Medicare Other | Source: Other Acute Inpatient Hospital | Attending: Internal Medicine | Admitting: Internal Medicine

## 2017-10-06 DIAGNOSIS — S72491A Other fracture of lower end of right femur, initial encounter for closed fracture: Secondary | ICD-10-CM | POA: Diagnosis not present

## 2017-10-06 DIAGNOSIS — Z9221 Personal history of antineoplastic chemotherapy: Secondary | ICD-10-CM | POA: Diagnosis not present

## 2017-10-06 DIAGNOSIS — K5900 Constipation, unspecified: Secondary | ICD-10-CM | POA: Diagnosis not present

## 2017-10-06 DIAGNOSIS — T148XXA Other injury of unspecified body region, initial encounter: Secondary | ICD-10-CM | POA: Diagnosis not present

## 2017-10-06 DIAGNOSIS — S72401A Unspecified fracture of lower end of right femur, initial encounter for closed fracture: Secondary | ICD-10-CM

## 2017-10-06 DIAGNOSIS — Z8542 Personal history of malignant neoplasm of other parts of uterus: Secondary | ICD-10-CM

## 2017-10-06 DIAGNOSIS — R0902 Hypoxemia: Secondary | ICD-10-CM | POA: Diagnosis not present

## 2017-10-06 DIAGNOSIS — S72451D Displaced supracondylar fracture without intracondylar extension of lower end of right femur, subsequent encounter for closed fracture with routine healing: Secondary | ICD-10-CM | POA: Diagnosis not present

## 2017-10-06 DIAGNOSIS — I209 Angina pectoris, unspecified: Secondary | ICD-10-CM | POA: Diagnosis not present

## 2017-10-06 DIAGNOSIS — R5381 Other malaise: Secondary | ICD-10-CM | POA: Diagnosis not present

## 2017-10-06 DIAGNOSIS — D62 Acute posthemorrhagic anemia: Secondary | ICD-10-CM | POA: Diagnosis not present

## 2017-10-06 DIAGNOSIS — G8918 Other acute postprocedural pain: Secondary | ICD-10-CM | POA: Diagnosis not present

## 2017-10-06 DIAGNOSIS — F419 Anxiety disorder, unspecified: Secondary | ICD-10-CM | POA: Diagnosis not present

## 2017-10-06 DIAGNOSIS — N738 Other specified female pelvic inflammatory diseases: Secondary | ICD-10-CM | POA: Diagnosis not present

## 2017-10-06 DIAGNOSIS — R262 Difficulty in walking, not elsewhere classified: Secondary | ICD-10-CM | POA: Diagnosis not present

## 2017-10-06 DIAGNOSIS — R079 Chest pain, unspecified: Secondary | ICD-10-CM | POA: Diagnosis present

## 2017-10-06 DIAGNOSIS — Z79899 Other long term (current) drug therapy: Secondary | ICD-10-CM

## 2017-10-06 DIAGNOSIS — M6281 Muscle weakness (generalized): Secondary | ICD-10-CM | POA: Diagnosis not present

## 2017-10-06 DIAGNOSIS — R52 Pain, unspecified: Secondary | ICD-10-CM | POA: Diagnosis not present

## 2017-10-06 DIAGNOSIS — Z7982 Long term (current) use of aspirin: Secondary | ICD-10-CM | POA: Diagnosis not present

## 2017-10-06 DIAGNOSIS — C763 Malignant neoplasm of pelvis: Secondary | ICD-10-CM | POA: Diagnosis not present

## 2017-10-06 DIAGNOSIS — I4891 Unspecified atrial fibrillation: Secondary | ICD-10-CM | POA: Diagnosis not present

## 2017-10-06 DIAGNOSIS — S7291XA Unspecified fracture of right femur, initial encounter for closed fracture: Secondary | ICD-10-CM

## 2017-10-06 DIAGNOSIS — Z9181 History of falling: Secondary | ICD-10-CM

## 2017-10-06 DIAGNOSIS — Z01811 Encounter for preprocedural respiratory examination: Secondary | ICD-10-CM

## 2017-10-06 DIAGNOSIS — I482 Chronic atrial fibrillation, unspecified: Secondary | ICD-10-CM | POA: Diagnosis present

## 2017-10-06 DIAGNOSIS — S72401D Unspecified fracture of lower end of right femur, subsequent encounter for closed fracture with routine healing: Secondary | ICD-10-CM | POA: Diagnosis not present

## 2017-10-06 DIAGNOSIS — W1839XA Other fall on same level, initial encounter: Secondary | ICD-10-CM | POA: Diagnosis not present

## 2017-10-06 DIAGNOSIS — Z923 Personal history of irradiation: Secondary | ICD-10-CM | POA: Diagnosis not present

## 2017-10-06 DIAGNOSIS — M1990 Unspecified osteoarthritis, unspecified site: Secondary | ICD-10-CM | POA: Diagnosis not present

## 2017-10-06 DIAGNOSIS — I5032 Chronic diastolic (congestive) heart failure: Secondary | ICD-10-CM | POA: Diagnosis not present

## 2017-10-06 DIAGNOSIS — E039 Hypothyroidism, unspecified: Secondary | ICD-10-CM | POA: Diagnosis present

## 2017-10-06 DIAGNOSIS — F418 Other specified anxiety disorders: Secondary | ICD-10-CM | POA: Diagnosis not present

## 2017-10-06 DIAGNOSIS — S72141D Displaced intertrochanteric fracture of right femur, subsequent encounter for closed fracture with routine healing: Secondary | ICD-10-CM | POA: Diagnosis not present

## 2017-10-06 DIAGNOSIS — Z4789 Encounter for other orthopedic aftercare: Secondary | ICD-10-CM | POA: Diagnosis not present

## 2017-10-06 DIAGNOSIS — W010XXA Fall on same level from slipping, tripping and stumbling without subsequent striking against object, initial encounter: Secondary | ICD-10-CM | POA: Diagnosis not present

## 2017-10-06 DIAGNOSIS — S72145S Nondisplaced intertrochanteric fracture of left femur, sequela: Secondary | ICD-10-CM | POA: Diagnosis not present

## 2017-10-06 DIAGNOSIS — N390 Urinary tract infection, site not specified: Secondary | ICD-10-CM | POA: Diagnosis present

## 2017-10-06 DIAGNOSIS — Z885 Allergy status to narcotic agent status: Secondary | ICD-10-CM | POA: Diagnosis not present

## 2017-10-06 DIAGNOSIS — Z7401 Bed confinement status: Secondary | ICD-10-CM | POA: Diagnosis not present

## 2017-10-06 DIAGNOSIS — I4821 Permanent atrial fibrillation: Secondary | ICD-10-CM | POA: Diagnosis not present

## 2017-10-06 DIAGNOSIS — K5720 Diverticulitis of large intestine with perforation and abscess without bleeding: Secondary | ICD-10-CM | POA: Diagnosis not present

## 2017-10-06 DIAGNOSIS — D72819 Decreased white blood cell count, unspecified: Secondary | ICD-10-CM | POA: Diagnosis not present

## 2017-10-06 DIAGNOSIS — S72145A Nondisplaced intertrochanteric fracture of left femur, initial encounter for closed fracture: Secondary | ICD-10-CM

## 2017-10-06 DIAGNOSIS — M25561 Pain in right knee: Secondary | ICD-10-CM | POA: Diagnosis not present

## 2017-10-06 DIAGNOSIS — E785 Hyperlipidemia, unspecified: Secondary | ICD-10-CM | POA: Diagnosis present

## 2017-10-06 DIAGNOSIS — S0990XA Unspecified injury of head, initial encounter: Secondary | ICD-10-CM | POA: Diagnosis not present

## 2017-10-06 DIAGNOSIS — M2550 Pain in unspecified joint: Secondary | ICD-10-CM | POA: Diagnosis not present

## 2017-10-06 DIAGNOSIS — R270 Ataxia, unspecified: Secondary | ICD-10-CM | POA: Diagnosis not present

## 2017-10-06 DIAGNOSIS — I10 Essential (primary) hypertension: Secondary | ICD-10-CM | POA: Diagnosis not present

## 2017-10-06 HISTORY — DX: Hypothyroidism, unspecified: E03.9

## 2017-10-06 HISTORY — DX: Unspecified osteoarthritis, unspecified site: M19.90

## 2017-10-06 HISTORY — DX: Chronic atrial fibrillation, unspecified: I48.20

## 2017-10-06 HISTORY — DX: Malignant (primary) neoplasm, unspecified: C80.1

## 2017-10-06 HISTORY — DX: Chronic diastolic (congestive) heart failure: I50.32

## 2017-10-06 HISTORY — DX: Other specified anxiety disorders: F41.8

## 2017-10-06 NOTE — Progress Notes (Signed)
Tentatively planning for ORIF of right femur tomorrow by Dr. Altamese [place].  Please keep pt NPO after MN tonight.

## 2017-10-07 ENCOUNTER — Inpatient Hospital Stay (HOSPITAL_COMMUNITY): Payer: Medicare Other

## 2017-10-07 ENCOUNTER — Encounter (HOSPITAL_COMMUNITY): Payer: Self-pay | Admitting: Internal Medicine

## 2017-10-07 ENCOUNTER — Other Ambulatory Visit: Payer: Self-pay

## 2017-10-07 ENCOUNTER — Inpatient Hospital Stay (HOSPITAL_COMMUNITY): Payer: Medicare Other | Admitting: Certified Registered Nurse Anesthetist

## 2017-10-07 ENCOUNTER — Encounter (HOSPITAL_COMMUNITY)
Admission: AD | Disposition: A | Discharge status: Skilled Nursing Facility | Payer: Self-pay | Attending: Internal Medicine

## 2017-10-07 ENCOUNTER — Inpatient Hospital Stay: Admit: 2017-10-07 | Payer: Medicare Other | Admitting: Orthopedic Surgery

## 2017-10-07 DIAGNOSIS — I482 Chronic atrial fibrillation, unspecified: Secondary | ICD-10-CM | POA: Diagnosis present

## 2017-10-07 DIAGNOSIS — I5032 Chronic diastolic (congestive) heart failure: Secondary | ICD-10-CM | POA: Diagnosis present

## 2017-10-07 DIAGNOSIS — S72401A Unspecified fracture of lower end of right femur, initial encounter for closed fracture: Secondary | ICD-10-CM | POA: Diagnosis present

## 2017-10-07 DIAGNOSIS — I4821 Permanent atrial fibrillation: Secondary | ICD-10-CM

## 2017-10-07 DIAGNOSIS — E039 Hypothyroidism, unspecified: Secondary | ICD-10-CM | POA: Diagnosis present

## 2017-10-07 DIAGNOSIS — R079 Chest pain, unspecified: Secondary | ICD-10-CM | POA: Diagnosis present

## 2017-10-07 DIAGNOSIS — E785 Hyperlipidemia, unspecified: Secondary | ICD-10-CM | POA: Diagnosis present

## 2017-10-07 DIAGNOSIS — F418 Other specified anxiety disorders: Secondary | ICD-10-CM | POA: Diagnosis present

## 2017-10-07 HISTORY — PX: ORIF FEMUR FRACTURE: SHX2119

## 2017-10-07 LAB — URINALYSIS, ROUTINE W REFLEX MICROSCOPIC
BILIRUBIN URINE: NEGATIVE
Glucose, UA: 50 mg/dL — AB
Hgb urine dipstick: NEGATIVE
Ketones, ur: NEGATIVE mg/dL
Nitrite: NEGATIVE
Protein, ur: NEGATIVE mg/dL
SPECIFIC GRAVITY, URINE: 1.026 (ref 1.005–1.030)
WBC, UA: >50 WBC/hpf — ABNORMAL HIGH (ref 0–5)
pH: 5 (ref 5.0–8.0)

## 2017-10-07 LAB — CBC
HCT: 30.1 % — ABNORMAL LOW (ref 36.0–46.0)
HEMATOCRIT: 30.3 % — AB (ref 36.0–46.0)
Hemoglobin: 9.6 g/dL — ABNORMAL LOW (ref 12.0–15.0)
Hemoglobin: 9.8 g/dL — ABNORMAL LOW (ref 12.0–15.0)
MCH: 32.4 pg (ref 26.0–34.0)
MCH: 32.8 pg (ref 26.0–34.0)
MCHC: 31.9 g/dL (ref 30.0–36.0)
MCHC: 32.3 g/dL (ref 30.0–36.0)
MCV: 101.7 fL — ABNORMAL HIGH (ref 78.0–100.0)
MCV: 101.3 fL — AB (ref 78.0–100.0)
Platelets: 152 10*3/uL (ref 150–400)
Platelets: 150 10*3/uL (ref 150–400)
RBC: 2.96 MIL/uL — ABNORMAL LOW (ref 3.87–5.11)
RBC: 2.99 MIL/uL — ABNORMAL LOW (ref 3.87–5.11)
RDW: 13 % (ref 11.5–15.5)
RDW: 13.1 % (ref 11.5–15.5)
WBC: 13.8 10*3/uL — ABNORMAL HIGH (ref 4.0–10.5)
WBC: 13.7 10*3/uL — ABNORMAL HIGH (ref 4.0–10.5)

## 2017-10-07 LAB — APTT: APTT: 32 s (ref 24–36)

## 2017-10-07 LAB — TYPE AND SCREEN
ABO/RH(D): A POS
Antibody Screen: NEGATIVE

## 2017-10-07 LAB — ABO/RH: ABO/RH(D): A POS

## 2017-10-07 LAB — SURGICAL PCR SCREEN
MRSA, PCR: NEGATIVE
Staphylococcus aureus: NEGATIVE

## 2017-10-07 LAB — PROTIME-INR
INR: 1.06
Prothrombin Time: 13.7 seconds (ref 11.4–15.2)

## 2017-10-07 LAB — TROPONIN I: Troponin I: <0.03 ng/mL (ref <0.03)

## 2017-10-07 LAB — LIPID PANEL
Cholesterol: 132 mg/dL (ref 0–200)
HDL: 43 mg/dL (ref >40)
LDL CALC: 76 mg/dL (ref 0–99)
TRIGLYCERIDES: 66 mg/dL (ref <150)
Total CHOL/HDL Ratio: 3.1 RATIO
VLDL: 13 mg/dL (ref 0–40)

## 2017-10-07 LAB — BRAIN NATRIURETIC PEPTIDE: B NATRIURETIC PEPTIDE 5: 50.2 pg/mL (ref 0.0–100.0)

## 2017-10-07 LAB — CREATININE, SERUM
Creatinine, Ser: 0.66 mg/dL (ref 0.44–1.00)
GFR calc non Af Amer: >60 mL/min (ref >60)

## 2017-10-07 SURGERY — OPEN REDUCTION INTERNAL FIXATION (ORIF) DISTAL FEMUR FRACTURE
Anesthesia: General | Laterality: Right

## 2017-10-07 MED ORDER — ACETAMINOPHEN 325 MG PO TABS: 650.0000 mg | ORAL_TABLET | Freq: Four times a day (QID) | ORAL | Status: DC | PRN

## 2017-10-07 MED ORDER — ROCURONIUM BROMIDE 50 MG/5ML IV SOSY
PREFILLED_SYRINGE | INTRAVENOUS | Status: AC
  Filled 2017-10-07: qty 5

## 2017-10-07 MED ORDER — ALBUMIN HUMAN 5 % IV SOLN
INTRAVENOUS | Status: DC | PRN
  Administered 2017-10-07 (×2): via INTRAVENOUS

## 2017-10-07 MED ORDER — LEVOTHYROXINE SODIUM 25 MCG PO TABS
25.0000 ug | ORAL_TABLET | Freq: Every day | ORAL | Status: DC
  Administered 2017-10-08 – 2017-10-11 (×4): 25 ug via ORAL
  Filled 2017-10-07 (×4): qty 1

## 2017-10-07 MED ORDER — POLYETHYLENE GLYCOL 3350 17 G PO PACK: 17.0000 g | PACK | Freq: Every day | ORAL | Status: DC | PRN

## 2017-10-07 MED ORDER — PROPOFOL 10 MG/ML IV BOLUS
INTRAVENOUS | Status: DC | PRN
  Administered 2017-10-07: 90 mg via INTRAVENOUS

## 2017-10-07 MED ORDER — METOCLOPRAMIDE HCL 5 MG PO TABS: 5.0000 mg | ORAL_TABLET | Freq: Three times a day (TID) | ORAL | Status: DC | PRN

## 2017-10-07 MED ORDER — MIDAZOLAM HCL 2 MG/2ML IJ SOLN
INTRAMUSCULAR | Status: AC
  Filled 2017-10-07: qty 2

## 2017-10-07 MED ORDER — 0.9 % SODIUM CHLORIDE (POUR BTL) OPTIME
TOPICAL | Status: DC | PRN
  Administered 2017-10-07: 1000 mL

## 2017-10-07 MED ORDER — OXYCODONE HCL 5 MG PO TABS: 5.0000 mg | ORAL_TABLET | ORAL | Status: DC | PRN

## 2017-10-07 MED ORDER — ROCURONIUM BROMIDE 50 MG/5ML IV SOSY
PREFILLED_SYRINGE | INTRAVENOUS | Status: DC | PRN
  Administered 2017-10-07 (×3): 50 mg via INTRAVENOUS

## 2017-10-07 MED ORDER — SUGAMMADEX SODIUM 200 MG/2ML IV SOLN
INTRAVENOUS | Status: DC | PRN
  Administered 2017-10-07: 200 mg via INTRAVENOUS

## 2017-10-07 MED ORDER — ONDANSETRON HCL 4 MG/2ML IJ SOLN
INTRAMUSCULAR | Status: AC
  Filled 2017-10-07: qty 2

## 2017-10-07 MED ORDER — ONDANSETRON HCL 4 MG PO TABS
4.0000 mg | ORAL_TABLET | Freq: Four times a day (QID) | ORAL | Status: DC | PRN
  Administered 2017-10-09: 4 mg via ORAL
  Filled 2017-10-07: qty 1

## 2017-10-07 MED ORDER — DEXAMETHASONE SODIUM PHOSPHATE 10 MG/ML IJ SOLN
INTRAMUSCULAR | Status: AC
  Filled 2017-10-07: qty 1

## 2017-10-07 MED ORDER — CEFAZOLIN SODIUM-DEXTROSE 2-4 GM/100ML-% IV SOLN
INTRAVENOUS | Status: AC
  Filled 2017-10-07: qty 100

## 2017-10-07 MED ORDER — NITROGLYCERIN 0.4 MG SL SUBL: 0.4000 mg | SUBLINGUAL_TABLET | SUBLINGUAL | Status: DC | PRN

## 2017-10-07 MED ORDER — FENTANYL CITRATE (PF) 100 MCG/2ML IJ SOLN
INTRAMUSCULAR | Status: DC | PRN
  Administered 2017-10-07: 100 ug via INTRAVENOUS
  Administered 2017-10-07 (×2): 50 ug via INTRAVENOUS

## 2017-10-07 MED ORDER — VANCOMYCIN HCL 1000 MG IV SOLR
INTRAVENOUS | Status: AC
  Filled 2017-10-07: qty 1000

## 2017-10-07 MED ORDER — ONDANSETRON HCL 4 MG/2ML IJ SOLN
INTRAMUSCULAR | Status: DC | PRN
  Administered 2017-10-07: 4 mg via INTRAVENOUS

## 2017-10-07 MED ORDER — MIRTAZAPINE 15 MG PO TABS
15.0000 mg | ORAL_TABLET | Freq: Every day | ORAL | Status: DC
  Administered 2017-10-07 – 2017-10-10 (×4): 15 mg via ORAL
  Filled 2017-10-07 (×4): qty 1

## 2017-10-07 MED ORDER — VITAMIN B-12 1000 MCG PO TABS
1000.0000 ug | ORAL_TABLET | Freq: Every day | ORAL | Status: DC
  Administered 2017-10-08 – 2017-10-11 (×4): 1000 ug via ORAL
  Filled 2017-10-07 (×4): qty 1

## 2017-10-07 MED ORDER — ENSURE SURGERY PO LIQD
237.0000 mL | Freq: Two times a day (BID) | ORAL | Status: DC
  Administered 2017-10-08 – 2017-10-10 (×5): 237 mL via ORAL
  Filled 2017-10-07 (×8): qty 237

## 2017-10-07 MED ORDER — ASPIRIN EC 81 MG PO TBEC
81.0000 mg | DELAYED_RELEASE_TABLET | Freq: Every day | ORAL | Status: DC
  Administered 2017-10-08 – 2017-10-11 (×4): 81 mg via ORAL
  Filled 2017-10-07 (×4): qty 1

## 2017-10-07 MED ORDER — ALPRAZOLAM 0.5 MG PO TABS: 0.5000 mg | ORAL_TABLET | Freq: Three times a day (TID) | ORAL | Status: DC | PRN

## 2017-10-07 MED ORDER — SODIUM CHLORIDE 0.9 % IV SOLN
INTRAVENOUS | Status: DC | PRN
  Administered 2017-10-07: 50 ug/min via INTRAVENOUS

## 2017-10-07 MED ORDER — PROPOFOL 10 MG/ML IV BOLUS
INTRAVENOUS | Status: AC
  Filled 2017-10-07: qty 20

## 2017-10-07 MED ORDER — HYDROMORPHONE HCL 1 MG/ML IJ SOLN: 0.2500 mg | INTRAMUSCULAR | Status: DC | PRN

## 2017-10-07 MED ORDER — KETOROLAC TROMETHAMINE 15 MG/ML IJ SOLN
7.5000 mg | Freq: Four times a day (QID) | INTRAMUSCULAR | Status: AC
  Administered 2017-10-07 – 2017-10-08 (×4): 7.5 mg via INTRAVENOUS
  Filled 2017-10-07 (×4): qty 1

## 2017-10-07 MED ORDER — ONDANSETRON HCL 4 MG/2ML IJ SOLN
4.0000 mg | Freq: Four times a day (QID) | INTRAMUSCULAR | Status: DC | PRN
  Administered 2017-10-08 – 2017-10-10 (×2): 4 mg via INTRAVENOUS
  Filled 2017-10-07 (×3): qty 2

## 2017-10-07 MED ORDER — METHOCARBAMOL 500 MG PO TABS
500.0000 mg | ORAL_TABLET | Freq: Three times a day (TID) | ORAL | Status: DC | PRN
  Administered 2017-10-08 – 2017-10-10 (×2): 500 mg via ORAL
  Filled 2017-10-07 (×2): qty 1

## 2017-10-07 MED ORDER — ENOXAPARIN SODIUM 40 MG/0.4ML ~~LOC~~ SOLN
40.0000 mg | SUBCUTANEOUS | Status: DC
  Administered 2017-10-08 – 2017-10-11 (×4): 40 mg via SUBCUTANEOUS
  Filled 2017-10-07 (×4): qty 0.4

## 2017-10-07 MED ORDER — LIDOCAINE 2% (20 MG/ML) 5 ML SYRINGE
INTRAMUSCULAR | Status: DC | PRN
  Administered 2017-10-07: 40 mg via INTRAVENOUS

## 2017-10-07 MED ORDER — PHENYLEPHRINE 40 MCG/ML (10ML) SYRINGE FOR IV PUSH (FOR BLOOD PRESSURE SUPPORT)
PREFILLED_SYRINGE | INTRAVENOUS | Status: DC | PRN
  Administered 2017-10-07 (×2): 120 ug via INTRAVENOUS
  Administered 2017-10-07: 200 ug via INTRAVENOUS
  Administered 2017-10-07: 120 ug via INTRAVENOUS
  Administered 2017-10-07: 160 ug via INTRAVENOUS
  Administered 2017-10-07: 200 ug via INTRAVENOUS

## 2017-10-07 MED ORDER — DOCUSATE SODIUM 100 MG PO CAPS
100.0000 mg | ORAL_CAPSULE | Freq: Two times a day (BID) | ORAL | Status: DC
  Administered 2017-10-07 – 2017-10-11 (×6): 100 mg via ORAL
  Filled 2017-10-07 (×7): qty 1

## 2017-10-07 MED ORDER — CEFAZOLIN SODIUM-DEXTROSE 2-4 GM/100ML-% IV SOLN
2.0000 g | INTRAVENOUS | Status: AC
  Administered 2017-10-07: 2 g via INTRAVENOUS

## 2017-10-07 MED ORDER — ACETAMINOPHEN 325 MG PO TABS
325.0000 mg | ORAL_TABLET | Freq: Four times a day (QID) | ORAL | Status: DC | PRN
  Administered 2017-10-09 – 2017-10-11 (×3): 650 mg via ORAL
  Filled 2017-10-07 (×2): qty 2

## 2017-10-07 MED ORDER — OXYCODONE-ACETAMINOPHEN 5-325 MG PO TABS
1.0000 | ORAL_TABLET | ORAL | Status: DC | PRN
  Administered 2017-10-07: 1 via ORAL
  Filled 2017-10-07: qty 1

## 2017-10-07 MED ORDER — CEFAZOLIN SODIUM-DEXTROSE 1-4 GM/50ML-% IV SOLN
1.0000 g | Freq: Three times a day (TID) | INTRAVENOUS | Status: AC
  Administered 2017-10-07 – 2017-10-08 (×2): 1 g via INTRAVENOUS
  Filled 2017-10-07 (×2): qty 50

## 2017-10-07 MED ORDER — SERTRALINE HCL 50 MG PO TABS
50.0000 mg | ORAL_TABLET | Freq: Every day | ORAL | Status: DC
  Administered 2017-10-08 – 2017-10-11 (×4): 50 mg via ORAL
  Filled 2017-10-07 (×4): qty 1

## 2017-10-07 MED ORDER — FENTANYL CITRATE (PF) 250 MCG/5ML IJ SOLN
INTRAMUSCULAR | Status: AC
  Filled 2017-10-07: qty 5

## 2017-10-07 MED ORDER — BOOST / RESOURCE BREEZE PO LIQD CUSTOM
1.0000 | Freq: Three times a day (TID) | ORAL | Status: AC
  Administered 2017-10-07 – 2017-10-08 (×3): 1 via ORAL

## 2017-10-07 MED ORDER — BUPIVACAINE HCL (PF) 0.25 % IJ SOLN
INTRAMUSCULAR | Status: AC
  Filled 2017-10-07: qty 30

## 2017-10-07 MED ORDER — DEXAMETHASONE SODIUM PHOSPHATE 10 MG/ML IJ SOLN
INTRAMUSCULAR | Status: DC | PRN
  Administered 2017-10-07: 10 mg via INTRAVENOUS

## 2017-10-07 MED ORDER — CALCIUM CHLORIDE 10 % IV SOLN
INTRAVENOUS | Status: AC
  Filled 2017-10-07: qty 10

## 2017-10-07 MED ORDER — OMEGA-3-ACID ETHYL ESTERS 1 G PO CAPS
1.0000 g | ORAL_CAPSULE | Freq: Every day | ORAL | Status: DC
  Administered 2017-10-08 – 2017-10-11 (×4): 1 g via ORAL
  Filled 2017-10-07 (×4): qty 1

## 2017-10-07 MED ORDER — ACETAMINOPHEN 325 MG PO TABS
650.0000 mg | ORAL_TABLET | Freq: Three times a day (TID) | ORAL | Status: DC
  Administered 2017-10-07 – 2017-10-11 (×12): 650 mg via ORAL
  Filled 2017-10-07 (×13): qty 2

## 2017-10-07 MED ORDER — LACTATED RINGERS IV SOLN
INTRAVENOUS | Status: DC
  Administered 2017-10-07 – 2017-10-08 (×2): via INTRAVENOUS

## 2017-10-07 MED ORDER — ONDANSETRON HCL 4 MG/2ML IJ SOLN: 4.0000 mg | Freq: Three times a day (TID) | INTRAMUSCULAR | Status: DC | PRN

## 2017-10-07 MED ORDER — ZOLPIDEM TARTRATE 5 MG PO TABS: 5.0000 mg | ORAL_TABLET | Freq: Every evening | ORAL | Status: DC | PRN

## 2017-10-07 MED ORDER — HYDRALAZINE HCL 20 MG/ML IJ SOLN: 5.0000 mg | INTRAMUSCULAR | Status: DC | PRN

## 2017-10-07 MED ORDER — DILTIAZEM HCL ER COATED BEADS 120 MG PO CP24
120.0000 mg | ORAL_CAPSULE | Freq: Every day | ORAL | Status: DC
  Administered 2017-10-07 – 2017-10-11 (×5): 120 mg via ORAL
  Filled 2017-10-07 (×5): qty 1

## 2017-10-07 MED ORDER — METOCLOPRAMIDE HCL 5 MG/ML IJ SOLN: 5.0000 mg | Freq: Three times a day (TID) | INTRAMUSCULAR | Status: DC | PRN

## 2017-10-07 SURGICAL SUPPLY — 79 items
BANDAGE ACE 4X5 VEL STRL LF (GAUZE/BANDAGES/DRESSINGS) ×3 IMPLANT
BANDAGE ACE 6X5 VEL STRL LF (GAUZE/BANDAGES/DRESSINGS) ×3 IMPLANT
BIT DRILL 4.3 (BIT) ×3 IMPLANT
BIT DRILL 4.3MM (BIT) ×1
BIT DRILL 4.3X300MM (BIT) ×1 IMPLANT
BIT DRILL LONG 3.3 (BIT) ×4 IMPLANT
BIT DRILL LONG 3.3MM (BIT) ×2
BIT DRILL QC 3.3X195 (BIT) IMPLANT
BLADE CLIPPER SURG (BLADE) IMPLANT
BNDG GAUZE ELAST 4 BULKY (GAUZE/BANDAGES/DRESSINGS) ×3 IMPLANT
BRUSH SCRUB SURG 4.25 DISP (MISCELLANEOUS) ×6 IMPLANT
CANISTER SUCT 3000ML PPV (MISCELLANEOUS) ×3 IMPLANT
CAP LOCK NCB (Cap) ×30 IMPLANT
COVER SURGICAL LIGHT HANDLE (MISCELLANEOUS) ×3 IMPLANT
COVER WAND RF STERILE (DRAPES) ×3 IMPLANT
DRAPE C-ARM 42X72 X-RAY (DRAPES) ×3 IMPLANT
DRAPE C-ARMOR (DRAPES) ×3 IMPLANT
DRAPE IMP U-DRAPE 54X76 (DRAPES) ×3 IMPLANT
DRAPE ORTHO SPLIT 77X108 STRL (DRAPES) ×6
DRAPE SURG ORHT 6 SPLT 77X108 (DRAPES) ×3 IMPLANT
DRAPE U-SHAPE 47X51 STRL (DRAPES) ×3 IMPLANT
DRILL BIT 4.3 (BIT) ×2
DRSG ADAPTIC 3X8 NADH LF (GAUZE/BANDAGES/DRESSINGS) ×3 IMPLANT
DRSG MEPILEX BORDER 4X12 (GAUZE/BANDAGES/DRESSINGS) ×3 IMPLANT
DRSG PAD ABDOMINAL 8X10 ST (GAUZE/BANDAGES/DRESSINGS) ×12 IMPLANT
ELECT REM PT RETURN 9FT ADLT (ELECTROSURGICAL) ×3
ELECTRODE REM PT RTRN 9FT ADLT (ELECTROSURGICAL) ×1 IMPLANT
EVACUATOR 1/8 PVC DRAIN (DRAIN) IMPLANT
EVACUATOR 3/16  PVC DRAIN (DRAIN)
EVACUATOR 3/16 PVC DRAIN (DRAIN) IMPLANT
GAUZE SPONGE 4X4 12PLY STRL (GAUZE/BANDAGES/DRESSINGS) ×3 IMPLANT
GLOVE BIO SURGEON STRL SZ7.5 (GLOVE) ×3 IMPLANT
GLOVE BIO SURGEON STRL SZ8 (GLOVE) ×3 IMPLANT
GLOVE BIOGEL PI IND STRL 7.5 (GLOVE) ×1 IMPLANT
GLOVE BIOGEL PI IND STRL 8 (GLOVE) ×1 IMPLANT
GLOVE BIOGEL PI INDICATOR 7.5 (GLOVE) ×2
GLOVE BIOGEL PI INDICATOR 8 (GLOVE) ×2
GOWN STRL REUS W/ TWL LRG LVL3 (GOWN DISPOSABLE) ×2 IMPLANT
GOWN STRL REUS W/ TWL XL LVL3 (GOWN DISPOSABLE) ×1 IMPLANT
GOWN STRL REUS W/TWL LRG LVL3 (GOWN DISPOSABLE) ×4
GOWN STRL REUS W/TWL XL LVL3 (GOWN DISPOSABLE) ×2
K-WIRE 2.0 (WIRE) ×6
K-WIRE FXSTD 280X2XNS SS (WIRE) ×3
KIT BASIN OR (CUSTOM PROCEDURE TRAY) ×3 IMPLANT
KIT TURNOVER KIT B (KITS) ×3 IMPLANT
KWIRE FXSTD 280X2XNS SS (WIRE) ×3 IMPLANT
NEEDLE 22X1 1/2 (OR ONLY) (NEEDLE) IMPLANT
NS IRRIG 1000ML POUR BTL (IV SOLUTION) ×3 IMPLANT
PACK TOTAL JOINT (CUSTOM PROCEDURE TRAY) ×3 IMPLANT
PACK UNIVERSAL I (CUSTOM PROCEDURE TRAY) ×3 IMPLANT
PAD ARMBOARD 7.5X6 YLW CONV (MISCELLANEOUS) ×6 IMPLANT
PAD CAST 4YDX4 CTTN HI CHSV (CAST SUPPLIES) ×1 IMPLANT
PADDING CAST COTTON 4X4 STRL (CAST SUPPLIES) ×2
PADDING CAST COTTON 6X4 STRL (CAST SUPPLIES) ×3 IMPLANT
PLATE DISTAL FEMUR 15H 317M RT (Plate) ×3 IMPLANT
SCREW 5.0 70MM (Screw) ×3 IMPLANT
SCREW 5.0 80MM (Screw) ×6 IMPLANT
SCREW CORTICAL 5.0X10MM (Screw) ×3 IMPLANT
SCREW CORTICAL NCB 5.0X40 (Screw) ×3 IMPLANT
SCREW NCB 4.0MX38M (Screw) ×3 IMPLANT
SCREW NCB 4.0MX44M (Screw) ×3 IMPLANT
SCREW NCB 5.0X85MM (Screw) ×12 IMPLANT
SPONGE LAP 18X18 X RAY DECT (DISPOSABLE) ×3 IMPLANT
STAPLER VISISTAT 35W (STAPLE) ×3 IMPLANT
SUCTION FRAZIER HANDLE 10FR (MISCELLANEOUS) ×2
SUCTION TUBE FRAZIER 10FR DISP (MISCELLANEOUS) ×1 IMPLANT
SUT ETHILON 2 0 PSLX (SUTURE) ×9 IMPLANT
SUT PROLENE 0 CT 2 (SUTURE) IMPLANT
SUT VIC AB 0 CT1 27 (SUTURE) ×4
SUT VIC AB 0 CT1 27XBRD ANBCTR (SUTURE) ×2 IMPLANT
SUT VIC AB 1 CT1 27 (SUTURE) ×6
SUT VIC AB 1 CT1 27XBRD ANBCTR (SUTURE) ×3 IMPLANT
SUT VIC AB 2-0 CT1 27 (SUTURE) ×4
SUT VIC AB 2-0 CT1 TAPERPNT 27 (SUTURE) ×2 IMPLANT
SYR 20ML ECCENTRIC (SYRINGE) IMPLANT
TOWEL OR 17X24 6PK STRL BLUE (TOWEL DISPOSABLE) ×3 IMPLANT
TOWEL OR 17X26 10 PK STRL BLUE (TOWEL DISPOSABLE) ×6 IMPLANT
TRAY FOLEY MTR SLVR 16FR STAT (SET/KITS/TRAYS/PACK) IMPLANT
WATER STERILE IRR 1000ML POUR (IV SOLUTION) ×6 IMPLANT

## 2017-10-07 NOTE — Progress Notes (Signed)
Nutrition Brief Note  Nutrition Screen performed as part of Hip fracture protocol.   There is no height or weight on file to calculate BMI.   Spoke with spouse shortly after patient arrived back to unit.   Spouse notes pt has not had any changes in weight or intake. She eats 3x a day. She supplements her diet with a mvi and Ensure, which she has consumed habitually for 5 years.   Patient agreeable to supplementing diet with Boost breeze in acute postop period and then transitioning to Regular Ensure when diet is advanced.    If nutrition issues arise, please consult RD.   Burtis Junes RD, LDN, CNSC Clinical Nutrition Available Tues-Sat via Pager: [phone] 10/07/2017 2:45 PM

## 2017-10-07 NOTE — Anesthesia Preprocedure Evaluation (Signed)

## 2017-10-07 NOTE — Progress Notes (Addendum)
Patient arrived to short stay stating she did not know where she was. Patient alert to person, event, and time. Reoriented patient. Initial sats were 88% on RA. Patient denies history of smoking or other lung history. Denies chest pain or shortness of breath. Notified Dr. Linna Caprice orders received for Portable CXR

## 2017-10-07 NOTE — Anesthesia Procedure Notes (Signed)
Procedure Name: Intubation Date/Time: 10/07/2017 9:18 AM Performed by: Lance Coon, CRNA Pre-anesthesia Checklist: Patient identified, Emergency Drugs available, Suction available, Patient being monitored and Timeout performed Patient Re-evaluated:Patient Re-evaluated prior to induction Oxygen Delivery Method: Circle system utilized Preoxygenation: Pre-oxygenation with 100% oxygen Induction Type: IV induction Ventilation: Mask ventilation without difficulty Laryngoscope Size: Miller and 2 Grade View: Grade II Tube type: Oral Tube size: 7.0 mm Number of attempts: 1 Airway Equipment and Method: Stylet Placement Confirmation: ETT inserted through vocal cords under direct vision,  positive ETCO2 and breath sounds checked- equal and bilateral Secured at: 21 cm Tube secured with: Tape Dental Injury: Teeth and Oropharynx as per pre-operative assessment

## 2017-10-07 NOTE — Anesthesia Postprocedure Evaluation (Signed)
Anesthesia Post Note  Patient: Tracy Bowman  Procedure(s) Performed: OPEN REDUCTION INTERNAL FIXATION (ORIF) DISTAL FEMUR FRACTURE (Right )     Patient location during evaluation: PACU Anesthesia Type: General Level of consciousness: awake and alert Pain management: pain level controlled Vital Signs Assessment: post-procedure vital signs reviewed and stable Respiratory status: spontaneous breathing, nonlabored ventilation, respiratory function stable and patient connected to nasal cannula oxygen Cardiovascular status: blood pressure returned to baseline and stable Postop Assessment: no apparent nausea or vomiting Anesthetic complications: no    Last Vitals:  Vitals:   10/07/17 1712 10/07/17 1944  BP: (!) 152/91 133/75  Pulse: (!) 124 96  Resp: 16   Temp: (!) 36.3 C 36.7 C  SpO2: 100% 99%    Last Pain:  Vitals:   10/07/17 1944  TempSrc: Oral  PainSc:                  Zriyah Kopplin COKER

## 2017-10-07 NOTE — Transfer of Care (Signed)
Immediate Anesthesia Transfer of Care Note  Patient: Tracy Bowman  Procedure(s) Performed: OPEN REDUCTION INTERNAL FIXATION (ORIF) DISTAL FEMUR FRACTURE (Right )  Patient Location: PACU  Anesthesia Type:General  Level of Consciousness: awake and drowsy  Airway & Oxygen Therapy: Patient Spontanous Breathing and Patient connected to nasal cannula oxygen  Post-op Assessment: Report given to RN and Post -op Vital signs reviewed and stable  Post vital signs: Reviewed and stable  Last Vitals:  Vitals Value Taken Time  BP 138/79 10/07/2017 12:34 PM  Temp    Pulse 110 10/07/2017 12:37 PM  Resp 15 10/07/2017 12:37 PM  SpO2 97 % 10/07/2017 12:37 PM  Vitals shown include unvalidated device data.  Last Pain:  Vitals:   10/07/17 0552  TempSrc: Axillary  PainSc:          Complications: No apparent anesthesia complications

## 2017-10-07 NOTE — Consult Note (Signed)
Orthopaedic Trauma Service (OTS) Consult   Patient ID: Tracy Bowman MRN: [id number] DOB/AGE: May 22, 1927 84 y.o.   Reason for Consult: Closed right distal femur fracture, peri-implant (short hip nail) Referring Physician: Victorino December, MD   HPI: Tracy Bowman is an 84 y.o. white female with medical history notable for chronic A. fib on no anticoagulation, CHF, depression hypothyroidism and hyperlipidemia who is approximately 6 weeks s/p intramedullary nailing of a intertrochanteric hip fracture who sustained a fall at home yesterday afternoon.  Patient states that she lost her balance using her walker and fell.  Patient had immediate onset of pain and inability to bear weight.  She was brought to [facility] where she was found to have a right distal femur fracture.  Due to the complexity of the injury the orthopedic staff at [facility] asserted that this was outside the scope of practice and requested transfer to [facility] hospital for definitive treatment by a orthopedic trauma fellowship trained surgeon.  Patient was transferred to [facility] hospital overnight.  Patient seen and evaluated in the preoperative holding area.  Patient was admitted to the medical service.  Patient denies any pain elsewhere.  Complains only of pain in her right lower thigh.  She has been home about 1 week from skilled nursing center following her hip fracture.  she has been using a walker.  States that she has been getting along fairly well.  Patient lives in a single-story house with her husband whom she states is in fairly good health.   Patient denies any numbness or tingling in her right lower extremity  Pain is exacerbated with movement and is relieved with rest and pain medication   Patient unsure as to when her last bone density scan was.  She is on Fosamax according to her medical reconciliation.  We will discontinue this indefinitely during her fracture healing phase.  Will complete metabolic bone work-up  during her hospitalization.  Given her hip fracture 6 weeks ago and now another low energy femur fracture to the ipsilateral side would suggest patient has osteoporosis.    Patient is on numerous psychotropic medications which are likely causing some issues with her balance.  Include Xanax, Remeron, Zoloft and Ultram   comprehensive metabolic panel and CBC were obtained at [facility] and are within normal ranges. Pt has been typed and screened pre-op as well.   Past Medical History:  Diagnosis Date  . Anxiety neurosis   . Chronic atrial fibrillation   . Chronic diastolic (congestive) heart failure ([place])   . Depression with anxiety   . Hyperlipidemia   . Hypothyroidism     No past surgical history on file.  Family History  Problem Relation Age of Onset  . Colon cancer Father   . Congestive Heart Failure Mother   . Dementia Brother   . Breast cancer Sister     Social History:  reports that she has never smoked. She has never used smokeless tobacco. She reports that she does not drink alcohol or use drugs.  Allergies:  Allergies  Allergen Reactions  . Morphine Other (See Comments)    Medications:  No current facility-administered medications on file prior to encounter.    Current Outpatient Medications on File Prior to Encounter  Medication Sig Dispense Refill  . acetaminophen (TYLENOL) 325 MG tablet Take 650 mg by mouth every 6 (six) hours as needed.    Marland Kitchen alendronate (FOSAMAX) 70 MG tablet PLEASE SEE ATTACHED FOR DETAILED DIRECTIONS  0  .  ALPRAZolam (XANAX) 0.5 MG tablet Take 1 tablet by mouth at bedtime as needed.   0  . diltiazem (TIAZAC) 120 MG 24 hr capsule Take 120 mg by mouth daily.  0  . furosemide (LASIX) 20 MG tablet Take 20 mg by mouth.    . levothyroxine (SYNTHROID, LEVOTHROID) 25 MCG tablet Take by mouth.    . Magnesium 200 MG TABS Take 1 tablet by mouth daily.  0  . metoprolol succinate (TOPROL-XL) 25 MG 24 hr tablet Take 25 mg by mouth daily.    .  mirtazapine (REMERON) 15 MG tablet Take by mouth.    . Multiple Vitamins tablet Take 1 tablet by mouth daily.    . Omega-3 Fatty Acids (FISH OIL) 1000 MG CAPS Take 1 capsule by mouth 2 (two) times daily.    . sertraline (ZOLOFT) 50 MG tablet Take 50 mg by mouth daily.  0  . traMADol (ULTRAM) 50 MG tablet Take 1 tablet by mouth every 8 (eight) hours as needed.  0     Results for orders placed or performed during the hospital encounter of 10/06/17 (from the past 48 hour(s))  Protime-INR     Status: None   Collection Time: 10/07/17 12:51 AM  Result Value Ref Range   Prothrombin Time 13.7 11.4 - 15.2 seconds   INR 1.06     Comment: Performed at [facility], [place] [address], [place], [place] [address]  APTT     Status: None   Collection Time: 10/07/17 12:51 AM  Result Value Ref Range   aPTT 32 24 - 36 seconds    Comment: Performed at [place] [address], [place], [place] [address]  Type and screen [place]     Status: None   Collection Time: 10/07/17 12:51 AM  Result Value Ref Range   ABO/RH(D) A POS    Antibody Screen NEG    Sample Expiration      10/10/2017 Performed at [facility], [place] [address], [place], [place] [address]   Brain natriuretic peptide     Status: None   Collection Time: 10/07/17 12:51 AM  Result Value Ref Range   B Natriuretic Peptide 50.2 0.0 - 100.0 pg/mL    Comment: Performed at [place] [address], [place], [place] [address]  ABO/Rh     Status: None   Collection Time: 10/07/17 12:51 AM  Result Value Ref Range   ABO/RH(D)      A POS Performed at [place] [address], [place],  [address]   Surgical pcr screen     Status: None   Collection Time: 10/07/17  2:04 AM  Result Value Ref Range   MRSA, PCR NEGATIVE NEGATIVE   Staphylococcus aureus NEGATIVE NEGATIVE    Comment: (NOTE) The Xpert SA Assay (FDA approved for NASAL specimens in patients 39 years of age and older), is  one component of a comprehensive surveillance program. It is not intended to diagnose infection nor to guide or monitor treatment. Performed at [facility], [place] [address] [place], [place] [address]   Troponin I (q 6hr x 3)     Status: None   Collection Time: 10/07/17  3:56 AM  Result Value Ref Range   Troponin I <0.03 <0.03 ng/mL    Comment: Performed at [place] [address] [place],  [address]  Lipid panel     Status: None  Collection Time: 10/07/17  3:56 AM  Result Value Ref Range   Cholesterol 132 0 - 200 mg/dL   Triglycerides 66 <150 mg/dL   HDL 43 >40 mg/dL   Total CHOL/HDL Ratio 3.1 RATIO   VLDL 13 0 - 40 mg/dL   LDL Cholesterol 76 0 - 99 mg/dL    Comment:        Total Cholesterol/HDL:CHD Risk Coronary Heart Disease Risk Table                     Men   Women  1/2 Average Risk   3.4   3.3  Average Risk       5.0   4.4  2 X Average Risk   9.6   7.1  3 X Average Risk  23.4   11.0        Use the calculated Patient Ratio above and the CHD Risk Table to determine the patient's CHD Risk.        ATP III CLASSIFICATION (LDL):  <100     mg/dL   Optimal  100-129  mg/dL   Near or Above                    Optimal  130-159  mg/dL   Borderline  160-189  mg/dL   High  >190     mg/dL   Very High Performed at [place] [address], [place], [place] [address]     Ct Head Wo Contrast  Result Date: 10/07/2017 CLINICAL DATA:  84 y/o  F; head trauma, ataxia. EXAM: CT HEAD WITHOUT CONTRAST TECHNIQUE: Contiguous axial images were obtained from the base of the skull through the vertex without intravenous contrast. COMPARISON:  08/28/2017 CT of the head. FINDINGS: Brain: No evidence of acute infarction, hemorrhage, hydrocephalus, extra-axial collection or mass lesion/mass effect. Stable chronic microvascular ischemic changes and volume loss of the brain. Vascular: Calcific atherosclerosis of carotid siphons. No hyperdense vessel identified. Skull:  Normal. Negative for fracture or focal lesion. Sinuses/Orbits: No acute finding. Other: Bilateral intra-ocular lens replacement. IMPRESSION: 1. No acute intracranial abnormality. 2. Stable chronic microvascular ischemic changes and volume loss of the brain. Electronically Signed   By: Kristine Garbe M.D.   On: 10/07/2017 05:18    X-ray right femur  Displaced right distal femur fracture, appears to be extra-articular, supracondylar.  Proximal extent of the fracture distal to the distal aspect of the right hip nail.  Right hip nail appears to be stable.  Right intertrochanteric hip fracture appears to be healing.  Lesser trochanteric fragment is free.  Review of Systems  Constitutional: Positive for malaise/fatigue. Negative for chills and fever.  Respiratory: Negative for shortness of breath and wheezing.   Cardiovascular: Negative for palpitations.  Gastrointestinal: Negative for abdominal pain, nausea and vomiting.  Musculoskeletal:       Right thigh pain  Neurological: Negative for tingling and sensory change.   Blood pressure (!) 142/71, pulse 93, temperature (!) 97.5 F (36.4 C), temperature source Axillary, resp. rate 17, SpO2 99 %. Physical Exam  Constitutional:  Cooperative 84 year old white female.  She appears to be very tired.  She is able to communicate well but is speaking softly.  HENT:  Head: Normocephalic and atraumatic.  Mouth/Throat: Mucous membranes are dry.  Neck: No spinous process tenderness and no muscular tenderness present.  Cardiovascular: An irregularly irregular rhythm present.  Pulmonary/Chest: No tachypnea. No respiratory distress.  Clear sounds noted on the left.  Patient does have some harsh sounds noted on the right.  O2 sats are in the high 90's  Abdominal: Soft. Normal appearance and bowel sounds are normal. There is no tenderness. There is no rigidity and no guarding.  Musculoskeletal:  Pelvis   no traumatic wounds or rash, no ecchymosis,  stable to manual stress, nontender  Right Lower Extremity  Inspection:    Right leg is shortened, flexed, externally rotated.  Deformity noted distal thigh     Unable to adequately move her right leg to evaluate surgical wounds to the right thigh Bony eval:    Tenderness palpation right distal thigh    Hip is relatively nontender    No pain with palpation lower leg, ankle or foot Soft tissue:    Soft tissues stable to the right thigh.  No open wounds or lesions appreciated ROM:    Not assessed due to she did Sensation:    DPN, SPN, TN sensory functions are intact Motor:    EHL, FHL, lesser toe motor functions are intact, anterior tibialis, posterior tibialis, peroneals and gastrocsoleus complex motor functions are grossly intact.  Vascular:    Extremity is warm, + DP pulse, compartments are soft, no pain with passive stretch     Mild swelling R leg   Left Lower Extremity              no open wounds or lesions, no swelling or ecchymosis   Nontender hip, knee, ankle and foot             No crepitus or gross motion noted with manipulation of the L leg  No knee or ankle effusion             No pain with axial loading or logrolling of the hip. Negative Stinchfield test   Knee stable to varus/ valgus and anterior/posterior stress             No pain with manipulation of the ankle or foot             No blocks to motion noted  Sens DPN, SPN, TN intact  Motor EHL, FHL, lesser toe motor, Ext, flex, evers 5/5  DP 2+, PT 2+, No significant edema             Compartments are soft and nontender, no pain with passive stretching  B Upper Extremities      shoulder, elbow, wrist, digits- no skin wounds, nontender, no instability, no blocks to motion  Sens  Ax/R/M/U intact  Mot   Ax/ R/ PIN/ M/ AIN/ U intact  Rad 2+    Neurological: She is alert.  Psychiatric: She has a normal mood and affect.     Assessment/Plan:  84 year old white female s/p fall with acute right distal femur  fracture, supracondylar, s/p intramedullary nailing right intertrochanteric hip fracture 6 weeks ago  -Low-energy ground-level fall  -Right supracondylar distal femur fracture, peri-implant  OR today for repair of fracture   Daily either fix by removing current nail and placing a longer intramedullary device or leave intramedullary device in place and utilize long periarticular plate.  We will likely make this decision intraoperatively once we have the leg pulled out to length.  Given her bone quality we do feel that the plate would provide a more stable construct     May allow the patient to be touchdown weightbearing postoperatively  Unrestricted range of motion of her knee postoperatively   PT and OT evaluations following surgery.  Patient may need to return to skilled nursing center for a brief period of time so long as she has not expended all of her medicare days  - Pain management:  Titrate accordingly postoperatively  Minimize narcotics  - ABL anemia/Hemodynamics  Monitor  - Medical issues   Per medical service  - DVT/PE prophylaxis:  lovenox post op   - ID:   periop abx   - Metabolic Bone Disease:  Metabolic bone workup   Dc fosamax   - FEN/GI prophylaxis/Foley/Lines:  NPO  - Impediments to fracture healing:  Osteoporosis  Polypharmacy   - Dispo:  OR today for repair of R distal femur fracture     Jari Pigg, PA-C [facility] [phone] (C) [phone] (O) 10/07/2017, 8:22 AM

## 2017-10-07 NOTE — Progress Notes (Signed)
Tracy Bowman is a 84 y.o. female with medical history significant of significant of atrial fibrillation not on anticoagulants, dCHF with EF 60-65%, remote endometrial cancer (s/p of surgery and chemotherapy, no radiation therapy per patient), hyperlipidemia, hypothyroidism, depression with anxiety, s/p of right hip fracture surgery 6 weeks ago, who presents with fall and right knee pain.  Patient was transferred from [facility] due to right distal femoral fracture.  Orthopedic team was consulted.  Patient will need surgery.  Further management will be as per orthopedic team.

## 2017-10-07 NOTE — H&P (Addendum)
History and Physical    Tracy Bowman WLN:[id number] DOB: [birth year] DOA: 10/06/2017  Referring MD/NP/PA:   PCP: Neale Burly, MD   Patient coming from:  The patient is coming from home.  At baseline, pt is independent for most of ADL.    Chief Complaint: fall and right knee pain  HPI: Tracy Bowman is a 84 y.o. female with medical history significant of significant of atrial fibrillation not on anticoagulants, dCHF with EF 60-65%, remote endometrial cancer (s/p of surgery and chemotherapy, no radiation therapy per patient), hyperlipidemia, hypothyroidism, depression with anxiety, s/p of right hip fracture surgery 6 weeks ago, who presents with fall and right knee pain.  Patient is transfered from [facility] due to right distal femur fracture.  Pt states that she accidentally fell when she was in kitchen in the evening.  She fell on her knee, causing severe pain in the right knee.  Denies LOC.  Patient states that she probably did not injure her head, but is not very sure.  She does not have headache or neck pain.  She denies dizziness, lightheadedness, unilateral weakness or numbness in extremities.  No facial droop or slurred speech. Patient states that she had mild intermittent chest pain recently, but not today. Currently no chest pain, shortness breath.  She has mild dry cough, but no fever chills.  Denies nausea vomiting, diarrhea, abdominal pain, symptoms of UTI or unilateral weakness.  ED Course: pt was found to have WBC 11.2, electrolytes renal function okay, no tachycardia, no tachypnea, oxygen saturation 98% on room air, no fever.  X-ray of right hip showed healing intertrochanteric fracture.  X-ray of right femur showed acute distal femur fracture.  Patient is admitted to telemetry bed as inpatient.  Review of Systems:   General: no fevers, chills, no body weight gain, has fatigue HEENT: no blurry vision, hearing changes or sore throat Respiratory: no dyspnea,  coughing, wheezing CV: no chest pain, no palpitations GI: no nausea, vomiting, abdominal pain, diarrhea, constipation GU: no dysuria, burning on urination, increased urinary frequency, hematuria  Ext: has trace leg edema Neuro: no unilateral weakness, numbness, or tingling, no vision change or hearing loss. Had fall. Skin: no rash, no skin tear. MSK: right knee pain and swelling. Heme: No easy bruising.  Travel history: No recent long distant travel.  Allergy:  Allergies  Allergen Reactions  . Morphine Other (See Comments)    Past Medical History:  Diagnosis Date  . Anxiety neurosis   . Chronic atrial fibrillation   . Chronic diastolic (congestive) heart failure ([place])   . Depression with anxiety   . Hyperlipidemia   . Hypothyroidism     No past surgical history on file.  Social History:  reports that she has never smoked. She has never used smokeless tobacco. She reports that she does not drink alcohol or use drugs.  Family History:  Family History  Problem Relation Age of Onset  . Colon cancer Father   . Congestive Heart Failure Mother   . Dementia Brother   . Breast cancer Sister      Prior to Admission medications   Medication Sig Start Date End Date Taking? Authorizing Provider  acetaminophen (TYLENOL) 325 MG tablet Take 650 mg by mouth every 6 (six) hours as needed.    [provider]  alendronate (FOSAMAX) 70 MG tablet PLEASE SEE ATTACHED FOR DETAILED DIRECTIONS 03/22/17   [provider]  ALPRAZolam Duanne Moron) 0.5 MG tablet Take 1 tablet by mouth  at bedtime as needed.  04/25/14   [provider]  diltiazem (TIAZAC) 120 MG 24 hr capsule Take 120 mg by mouth daily. 05/04/17   [provider]  furosemide (LASIX) 20 MG tablet Take 20 mg by mouth. 04/14/14   [provider]  levothyroxine (SYNTHROID, LEVOTHROID) 25 MCG tablet Take by mouth. 07/01/14   [provider]  Magnesium 200 MG TABS Take 1 tablet by mouth daily.  04/29/17   [provider]  metoprolol succinate (TOPROL-XL) 25 MG 24 hr tablet Take 25 mg by mouth daily.    [provider]  mirtazapine (REMERON) 15 MG tablet Take by mouth.    [provider]  Multiple Vitamins tablet Take 1 tablet by mouth daily.    [provider]  Omega-3 Fatty Acids (FISH OIL) 1000 MG CAPS Take 1 capsule by mouth 2 (two) times daily.    [provider]  sertraline (ZOLOFT) 50 MG tablet Take 50 mg by mouth daily. 04/13/17   [provider]  traMADol (ULTRAM) 50 MG tablet Take 1 tablet by mouth every 8 (eight) hours as needed. 01/19/14   [provider]    Physical Exam: Vitals:   10/06/17 2353 10/07/17 0552  BP: (!) 166/82 (!) 142/71  Pulse: 86 93  Resp: 17   Temp: 98.2 F (36.8 C) (!) 97.5 F (36.4 C)  TempSrc: Oral Axillary  SpO2: 99% 99%   General: Not in acute distress HEENT:       Eyes: PERRL, EOMI, no scleral icterus.       ENT: No discharge from the ears and nose, no pharynx injection, no tonsillar enlargement.        Neck: No JVD, no bruit, no mass felt. Heme: No neck lymph node enlargement. Cardiac: S1/S2, RRR, No murmurs, No gallops or rubs. Respiratory: No rales, wheezing, rhonchi or rubs. GI: Soft, nondistended, nontender, no rebound pain, no organomegaly, BS present. GU: No hematuria Ext: has trace leg edema bilaterally. 2+DP/PT pulse bilaterally. Musculoskeletal: has tenderness and mild swelling in right knee Skin: No rashes.  Neuro: Alert, oriented X3, cranial nerves II-XII grossly intact, moves all extremities. Psych: Patient is not psychotic, no suicidal or hemocidal ideation.  Labs on Admission: I have personally reviewed following labs and imaging studies  CBC: No results for input(s): WBC, NEUTROABS, HGB, HCT, MCV, PLT in the last 168 hours. Basic Metabolic Panel: No results for input(s): NA, K, CL, CO2, GLUCOSE, BUN, CREATININE, CALCIUM, MG, PHOS in the last 168  hours. GFR: CrCl cannot be calculated (Patient's most recent lab result is older than the maximum 21 days allowed.). Liver Function Tests: No results for input(s): AST, ALT, ALKPHOS, BILITOT, PROT, ALBUMIN in the last 168 hours. No results for input(s): LIPASE, AMYLASE in the last 168 hours. No results for input(s): AMMONIA in the last 168 hours. Coagulation Profile: Recent Labs  Lab 10/07/17 0051  INR 1.06   Cardiac Enzymes: Recent Labs  Lab 10/07/17 0356  TROPONINI <0.03   BNP (last 3 results) No results for input(s): PROBNP in the last 8760 hours. HbA1C: No results for input(s): HGBA1C in the last 72 hours. CBG: No results for input(s): GLUCAP in the last 168 hours. Lipid Profile: Recent Labs    10/07/17 0356  CHOL 132  HDL 43  LDLCALC 76  TRIG 66  CHOLHDL 3.1   Thyroid Function Tests: No results for input(s): TSH, T4TOTAL, FREET4, T3FREE, THYROIDAB in the last 72 hours. Anemia Panel: No results for input(s):  VITAMINB12, FOLATE, FERRITIN, TIBC, IRON, RETICCTPCT in the last 72 hours. Urine analysis: No results found for: COLORURINE, APPEARANCEUR, LABSPEC, PHURINE, GLUCOSEU, HGBUR, BILIRUBINUR, KETONESUR, PROTEINUR, UROBILINOGEN, NITRITE, LEUKOCYTESUR Sepsis Labs: @LABRCNTIP (procalcitonin:4,lacticidven:4) ) Recent Results (from the past 240 hour(s))  Surgical pcr screen     Status: None   Collection Time: 10/07/17  2:04 AM  Result Value Ref Range Status   MRSA, PCR NEGATIVE NEGATIVE Final   Staphylococcus aureus NEGATIVE NEGATIVE Final    Comment: (NOTE) The Xpert SA Assay (FDA approved for NASAL specimens in patients 36 years of age and older), is one component of a comprehensive surveillance program. It is not intended to diagnose infection nor to guide or monitor treatment. Performed at [facility], [place] [address], [place], [place] [address]      Radiological Exams on Admission: Ct Head Wo Contrast  Result Date: 10/07/2017 CLINICAL DATA:  84  y/o  F; head trauma, ataxia. EXAM: CT HEAD WITHOUT CONTRAST TECHNIQUE: Contiguous axial images were obtained from the base of the skull through the vertex without intravenous contrast. COMPARISON:  08/28/2017 CT of the head. FINDINGS: Brain: No evidence of acute infarction, hemorrhage, hydrocephalus, extra-axial collection or mass lesion/mass effect. Stable chronic microvascular ischemic changes and volume loss of the brain. Vascular: Calcific atherosclerosis of carotid siphons. No hyperdense vessel identified. Skull: Normal. Negative for fracture or focal lesion. Sinuses/Orbits: No acute finding. Other: Bilateral intra-ocular lens replacement. IMPRESSION: 1. No acute intracranial abnormality. 2. Stable chronic microvascular ischemic changes and volume loss of the brain. Electronically Signed   By: Kristine Garbe M.D.   On: 10/07/2017 05:18     EKG: Independently reviewed.  Atrial flutter, QTC 438, low voltage, nonspecific T wave change.  Assessment/Plan Principal Problem:   Closed fracture of right distal femur (HCC) Active Problems:   Atrial fibrillation (HCC)   Hyperlipidemia   Chronic atrial fibrillation   Chronic diastolic (congestive) heart failure (HCC)   Depression with anxiety   Hypothyroidism   Chest pain   Closed fracture of right distal femur ([place]): As evidenced by x-ray. Patient has moderate pain now. No neurovascular compromise. Orthopedic surgeon, Dr. Stann Mainland was consulted.   - will admit to tele bed as inpt - Pain control: dilauid prn and percocet - When necessary Zofran for nausea - Robaxin for muscle spasm - type and cross - INR/PTT - PT/OT when able to (not ordered now) -f/u Ct-head  Atrial Fibrillation: CHA2DS2-VASc Score is 4, needs oral anticoagulation, but patient is not on AC at home. Per her cardiologist, Dr. Lora Havens note, patient was reluctant to take blood thinner, and also because that the patient has high risk of fall, therefore patient is not  on anticoagulants.  Heart rate is well controlled -On Cardizem  Hyperlipidemia: not taking meds -f/u FLP  Chronic diastolic (congestive) heart failure ([place]): 2D echo done in [place] on 04/16/2014 showed EF 60-65%.  Patient has trace leg edema, but no JVD or respiratory distress.  CHF seems to be compensated. -Check BNP  Depression and anxiety: Stable, no suicidal or homicidal ideations. -Continue home medications: Zoloft, Remeron, Xanax  Hypothyroidism: -Continue Synthroid  Intermittent mild chest pain: Patient states that she had intermittent chest pain recently, but no chest pain or shortness of breath today. -Aspirin, PRN nitroglycerin, PRN morphine -Troponin x3 to make sure patient does not have ACS -f/u A1c and FLP    DVT ppx: SCD Code Status: Full code (patient states that she needs time to think about this code related to  question) Family Communication: None at bed side.   Disposition Plan:  To be determined Consults called:  none Admission status:   Inpatient/tele  Date of Service 10/07/2017    Ivor Costa [facility] Pager [phone]  If 7PM-7AM, please contact night-coverage [email or web address] Password [email or web address] 10/07/2017, 7:18 AM

## 2017-10-08 LAB — CBC
HCT: 26.1 % — ABNORMAL LOW (ref 36.0–46.0)
HEMOGLOBIN: 8.2 g/dL — AB (ref 12.0–15.0)
MCH: 32 pg (ref 26.0–34.0)
MCHC: 31.4 g/dL (ref 30.0–36.0)
MCV: 102 fL — ABNORMAL HIGH (ref 78.0–100.0)
Platelets: 150 10*3/uL (ref 150–400)
RBC: 2.56 MIL/uL — AB (ref 3.87–5.11)
RDW: 13.1 % (ref 11.5–15.5)
WBC: 14.1 10*3/uL — AB (ref 4.0–10.5)

## 2017-10-08 LAB — POCT I-STAT EG7
Acid-base deficit: 1 mmol/L (ref 0.0–2.0)
Bicarbonate: 25 mmol/L (ref 20.0–28.0)
Calcium, Ion: 1.11 mmol/L — ABNORMAL LOW (ref 1.15–1.40)
HEMATOCRIT: 31 % — AB (ref 36.0–46.0)
Hemoglobin: 10.5 g/dL — ABNORMAL LOW (ref 12.0–15.0)
O2 Saturation: 92 %
POTASSIUM: 4.2 mmol/L (ref 3.5–5.1)
Sodium: 137 mmol/L (ref 135–145)
TCO2: 26 mmol/L (ref 22–32)
pCO2, Ven: 44.1 mmHg (ref 44.0–60.0)
pH, Ven: 7.363 (ref 7.250–7.430)
pO2, Ven: 65 mmHg — ABNORMAL HIGH (ref 32.0–45.0)

## 2017-10-08 LAB — BASIC METABOLIC PANEL
ANION GAP: 8 (ref 5–15)
BUN: 27 mg/dL — ABNORMAL HIGH (ref 8–23)
CALCIUM: 7.9 mg/dL — AB (ref 8.9–10.3)
CO2: 25 mmol/L (ref 22–32)
Chloride: 98 mmol/L (ref 98–111)
Creatinine, Ser: 0.88 mg/dL (ref 0.44–1.00)
GFR, EST NON AFRICAN AMERICAN: 57 mL/min — AB (ref >60)
Glucose, Bld: 307 mg/dL — ABNORMAL HIGH (ref 70–99)
Potassium: 4.6 mmol/L (ref 3.5–5.1)
Sodium: 131 mmol/L — ABNORMAL LOW (ref 135–145)

## 2017-10-08 LAB — HEMOGLOBIN A1C
Hgb A1c MFr Bld: 6 % — ABNORMAL HIGH (ref 4.8–5.6)
MEAN PLASMA GLUCOSE: 126 mg/dL

## 2017-10-08 NOTE — Progress Notes (Signed)
PROGRESS NOTE    Tracy Bowman  SHF:[id number] DOB: 1927/12/12 DOA: 10/06/2017 PCP: Neale Burly, MD  Outpatient Specialists:     Brief Narrative:  Shresta A Custeris an 84 y.o.femalewith past medical history significant for atrial fibrillation not on anticoagulants, dCHF with EF 60-65%,remote endometrial cancer(s/p ofsurgery and chemotherapy, no radiation therapy per patient),hyperlipidemia, hypothyroidism, depression with anxiety, s/p ofright hip fracture surgery 6 weeks ago.  Patient presented with fall and rightknee pain.  Patient was transferred from [facility] due to right distal femoral fracture.  Orthopedic team was consulted.  Patient underwent open reduction and internal fixation of right distal femoral fracture yesterday, 10/07/2017.  Physical therapy input is appreciated.  Patient is currently undergoing occupational therapy assessment.  Orthopedic team is managing postop care.    Assessment & Plan:   Principal Problem:   Closed fracture of right distal femur ([place]) Active Problems:   Atrial fibrillation (HCC)   Hyperlipidemia   Chronic atrial fibrillation   Chronic diastolic (congestive) heart failure (HCC)   Depression with anxiety   Hypothyroidism   Chest pain  Closed fracture of right distal femur ([place]): As evidenced by x-ray. Patient has moderate pain now. No neurovascular compromise. Orthopedic surgeon, Dr. Stann Mainland was consulted.   - will admit to tele bed as inpt - Pain control: dilauid prn and percocet - When necessary Zofran for nausea - Robaxin for muscle spasm - type and cross - INR/PTT - PT/OT when able to (not ordered now) -f/u Ct-head 10/08/2017: Patient underwent ORIF of right distal femoral fracture yesterday, 10/07/2017.  Days postop day 1.  Patient remains stable.  Orthopedic surgery input is appreciated.  Orthopedics team is managing.  Postop care.  Atrial Fibrillation: CHA2DS2-VASc Score is 4, needs oral anticoagulation, but  patient is not on AC at home. Per her cardiologist, Dr. Lora Havens note, patient was reluctant to take blood thinner, and also because that the patient has high risk of fall, therefore patient is not on anticoagulants.  Heart rate is well controlled -On Cardizem -Continue rate control.  Hyperlipidemia: not taking meds -f/u FLP  Chronic diastolic (congestive) heart failure ([place]): 2D echo done in [place] on 04/16/2014 showed EF 60-65%.  Patient has trace leg edema, but no JVD or respiratory distress.  CHF seems to be compensated. -Check BNP  Depression and anxiety: Stable, no suicidal or homicidal ideations. -Continue home medications: Zoloft, Remeron, Xanax  Hypothyroidism: -Continue Synthroid  Intermittent mild chest pain: Patient states that she had intermittent chest pain recently, but no chest pain or shortness of breath today. -Aspirin, PRN nitroglycerin, PRN morphine -Troponin x3 to make sure patient does not have ACS -f/u A1c and FLP 10/08/2017: No further chest pain reported.    DVT ppx: SCD Code Status: Full code  Family Communication: Husband.     Disposition Plan:  To be determined Admission status:   Inpatient/tele  Consultants:   Orthopedic surgery.  Procedures:   Open reduction and internal fixation of right distal femoral fracture.  Antimicrobials:   None   Subjective: No new complaints. Pain is adequately controlled.  Objective: Vitals:   10/08/17 0011 10/08/17 0550 10/08/17 0904 10/08/17 1311  BP: (!) 153/66 117/63 116/83 131/66  Pulse: 75 70  66  Resp:    20  Temp: 97.7 F (36.5 C) (!) 97.5 F (36.4 C)    TempSrc: Axillary Axillary    SpO2: 98% 95%      Intake/Output Summary (Last 24 hours) at 10/08/2017 1604 Last data filed at  10/08/2017 0943 Gross per 24 hour  Intake 480 ml  Output 500 ml  Net -20 ml   There were no vitals filed for this visit.  Examination:  General exam: Appears calm and comfortable  Respiratory  system: Clear to auscultation.  Cardiovascular system: S1 & S2.  Gastrointestinal system: Abdomen is nondistended, soft and nontender. No organomegaly or masses felt. Normal bowel sounds heard. Central nervous system: Alert and oriented. No focal neurological deficits. Extremities: Fullness of the ankle.  Data Reviewed: I have personally reviewed following labs and imaging studies  CBC: Recent Labs  Lab 10/07/17 1407 10/07/17 1505 10/08/17 0812  WBC 13.8* 13.7* 14.1*  HGB 9.6* 9.8* 8.2*  HCT 30.1* 30.3* 26.1*  MCV 101.7* 101.3* 102.0*  PLT 152 150 629   Basic Metabolic Panel: Recent Labs  Lab 10/07/17 1505 10/08/17 0812  NA  --  131*  K  --  4.6  CL  --  98  CO2  --  25  GLUCOSE  --  307*  BUN  --  27*  CREATININE 0.66 0.88  CALCIUM  --  7.9*   GFR: CrCl cannot be calculated (Unknown ideal weight.). Liver Function Tests: No results for input(s): AST, ALT, ALKPHOS, BILITOT, PROT, ALBUMIN in the last 168 hours. No results for input(s): LIPASE, AMYLASE in the last 168 hours. No results for input(s): AMMONIA in the last 168 hours. Coagulation Profile: Recent Labs  Lab 10/07/17 0051  INR 1.06   Cardiac Enzymes: Recent Labs  Lab 10/07/17 0356  TROPONINI <0.03   BNP (last 3 results) No results for input(s): PROBNP in the last 8760 hours. HbA1C: Recent Labs    10/07/17 0356  HGBA1C 6.0*   CBG: No results for input(s): GLUCAP in the last 168 hours. Lipid Profile: Recent Labs    10/07/17 0356  CHOL 132  HDL 43  LDLCALC 76  TRIG 66  CHOLHDL 3.1   Thyroid Function Tests: No results for input(s): TSH, T4TOTAL, FREET4, T3FREE, THYROIDAB in the last 72 hours. Anemia Panel: No results for input(s): VITAMINB12, FOLATE, FERRITIN, TIBC, IRON, RETICCTPCT in the last 72 hours. Urine analysis:    Component Value Date/Time   COLORURINE YELLOW 10/07/2017 0828   APPEARANCEUR CLOUDY (A) 10/07/2017 0828   LABSPEC 1.026 10/07/2017 0828   PHURINE 5.0 10/07/2017  0828   GLUCOSEU 50 (A) 10/07/2017 0828   HGBUR NEGATIVE 10/07/2017 0828   BILIRUBINUR NEGATIVE 10/07/2017 0828   KETONESUR NEGATIVE 10/07/2017 0828   PROTEINUR NEGATIVE 10/07/2017 0828   NITRITE NEGATIVE 10/07/2017 0828   LEUKOCYTESUR MODERATE (A) 10/07/2017 0828   Sepsis Labs: @LABRCNTIP (procalcitonin:4,lacticidven:4)  ) Recent Results (from the past 240 hour(s))  Surgical pcr screen     Status: None   Collection Time: 10/07/17  2:04 AM  Result Value Ref Range Status   MRSA, PCR NEGATIVE NEGATIVE Final   Staphylococcus aureus NEGATIVE NEGATIVE Final    Comment: (NOTE) The Xpert SA Assay (FDA approved for NASAL specimens in patients 24 years of age and older), is one component of a comprehensive surveillance program. It is not intended to diagnose infection nor to guide or monitor treatment. Performed at [facility], [place] [address], [place], [place] [address]      Radiology Studies: Ct Head Wo Contrast  Result Date: 10/07/2017 CLINICAL DATA:  84 y/o  F; head trauma, ataxia. EXAM: CT HEAD WITHOUT CONTRAST TECHNIQUE: Contiguous axial images were obtained from the base of the skull through the vertex without intravenous contrast. COMPARISON:  08/28/2017 CT of the head. FINDINGS: Brain: No evidence of acute infarction, hemorrhage, hydrocephalus, extra-axial collection or mass lesion/mass effect. Stable chronic microvascular ischemic changes and volume loss of the brain. Vascular: Calcific atherosclerosis of carotid siphons. No hyperdense vessel identified. Skull: Normal. Negative for fracture or focal lesion. Sinuses/Orbits: No acute finding. Other: Bilateral intra-ocular lens replacement. IMPRESSION: 1. No acute intracranial abnormality. 2. Stable chronic microvascular ischemic changes and volume loss of the brain. Electronically Signed   By: Kristine Garbe M.D.   On: 10/07/2017 05:18   Dg Chest Port 1 View  Result Date: 10/07/2017 CLINICAL DATA:  Low O2 sats, preop  EXAM: PORTABLE CHEST 1 VIEW COMPARISON:  08/28/2017 FINDINGS: Cardiomegaly. Lungs clear. No effusions or edema. No acute bony abnormality. IMPRESSION: Cardiomegaly.  No active disease. Electronically Signed   By: Rolm Baptise M.D.   On: 10/07/2017 08:24   Dg C-arm 1-60 Min  Result Date: 10/07/2017 CLINICAL DATA:  ORIF distal femoral fracture EXAM: RIGHT FEMUR 2 VIEWS; DG C-ARM 61-120 MIN COMPARISON:  10/06/2017 FINDINGS: Plate and screw fixation noted across the distal right femoral fracture. Cerclage wire also placed. Final image demonstrates continued mild displacement of the distal fragment posteriorly on the lateral view. IMPRESSION: Internal fixation of the distal femoral fracture as above. Electronically Signed   By: Rolm Baptise M.D.   On: 10/07/2017 12:12   Dg C-arm 1-60 Min  Result Date: 10/07/2017 CLINICAL DATA:  ORIF distal femoral fracture EXAM: RIGHT FEMUR 2 VIEWS; DG C-ARM 61-120 MIN COMPARISON:  10/06/2017 FINDINGS: Plate and screw fixation noted across the distal right femoral fracture. Cerclage wire also placed. Final image demonstrates continued mild displacement of the distal fragment posteriorly on the lateral view. IMPRESSION: Internal fixation of the distal femoral fracture as above. Electronically Signed   By: Rolm Baptise M.D.   On: 10/07/2017 12:12   Dg Femur, Min 2 Views Right  Result Date: 10/07/2017 CLINICAL DATA:  ORIF distal femoral fracture EXAM: RIGHT FEMUR 2 VIEWS; DG C-ARM 61-120 MIN COMPARISON:  10/06/2017 FINDINGS: Plate and screw fixation noted across the distal right femoral fracture. Cerclage wire also placed. Final image demonstrates continued mild displacement of the distal fragment posteriorly on the lateral view. IMPRESSION: Internal fixation of the distal femoral fracture as above. Electronically Signed   By: Rolm Baptise M.D.   On: 10/07/2017 12:12   Dg Femur Port, Min 2 Views Right  Result Date: 10/07/2017 CLINICAL DATA:  84 year old female status post  ORIF right hip/femur EXAM: RIGHT FEMUR PORTABLE 2 VIEW COMPARISON:  10/06/2017 FINDINGS: Interval surgical changes of open reduction internal fixation of a distal femur fracture, with buttress plate and screw fixation with cerclage wires, and reduction of the deformity, with anatomic alignment maintained currently. Redemonstration of surgical changes of antegrade intramedullary rod and gamma nail of the right hip. Soft tissue swelling with gas in the surgical bed. Osteopenia. IMPRESSION: Early postoperative changes of open reduction internal fixation of the distal femur fracture with buttress plate and screw and cerclage wire fixation with improved alignment. Redemonstration of prior hip fixation, as above. Electronically Signed   By: Corrie Mckusick D.O.   On: 10/07/2017 13:38        Scheduled Meds: . acetaminophen  650 mg Oral Q8H  . aspirin EC  81 mg Oral Daily  . diltiazem  120 mg Oral Daily  . docusate sodium  100 mg Oral BID  . enoxaparin (LOVENOX) injection  40 mg Subcutaneous Q24H  . feeding supplement  237 mL  Oral BID BM  . levothyroxine  25 mcg Oral QAC breakfast  . mirtazapine  15 mg Oral QHS  . omega-3 acid ethyl esters  1 g Oral Daily  . sertraline  50 mg Oral Daily  . vitamin B-12  1,000 mcg Oral Daily   Continuous Infusions: . lactated ringers 10 mL/hr at 10/08/17 1324     LOS: 2 days    Time spent: 25 minutes.    Dana Allan, MD  [facility] Pager #: [phone] 7PM-7AM contact night coverage as above

## 2017-10-08 NOTE — Progress Notes (Signed)
Inpatient Rehabilitation  Patient was screened by Gunnar Fusi for appropriateness for an Inpatient Acute Rehab consult.  Note that PT is recommending home health and OT is recommending post acute rehab.  If you want patient to be considered for IP Rehab please place a consult order if agreeable. Text paged MD to notify.    Carmelia Roller., CCC/SLP Admission Coordinator  [place]  Cell [phone]

## 2017-10-08 NOTE — Evaluation (Signed)
Occupational Therapy Evaluation Patient Details Name: Tracy Bowman MRN: [id number] DOB: [birth year] Today's Date: 10/08/2017    History of Present Illness Pt is an 84 yo female A&Ox4 admitted through ED on 10/06/17 for right distal femur fx. Pt underwent an ORIF on 10/07/17. Pt had a previous fall with ORIF to right hip s/p 6 weeks. Both incidents occured when pt bend over and had vertigo like symptoms with standing up. PMH signficant for A-fib, CHF, HLD, hypothyroid, depression with anxiety.    Clinical Impression   PTA patient reports independent with ADLs, mobility using RW, and limited IADLs. She was admitted for above and is limited by TDWB R LE, pain, impaired balance, decreased safety, and generalized weakness.  She currently requires setup assist for UB ADLs, total assist for LB ADLs, +2 mod assist for toilet transfers and +2 total assist for toileting.  She recently spent time at SNF, and was about to start [facility] therapy prior to admission.  Based on performance today, believe patient may benefit from intensive CIR level therapies (if able to tolerate) in order to optimize independence with ADLs and mobility/provide family training, as pt and husband are adamantly refusing SNF. Will continue to follow and update dc recommendations as needed.     Follow Up Recommendations  Supervision/Assistance - 24 hour;Other (comment)(post acute rehab )    Equipment Recommendations  None recommended by OT    Recommendations for Other Services       Precautions / Restrictions Precautions Precautions: Fall Restrictions Weight Bearing Restrictions: Yes RLE Weight Bearing: Touchdown weight bearing      Mobility Bed Mobility Overal bed mobility: Needs Assistance Bed Mobility: Rolling;Sidelying to Sit Rolling: Mod assist Sidelying to sit: Mod assist       General bed mobility comments: seated in recliner upon entry  Transfers Overall transfer level: Needs assistance Equipment used: Rolling  walker (2 wheeled) Transfers: Squat Pivot Transfers Sit to Stand: +2 physical assistance;Mod assist;+2 safety/equipment Stand pivot transfers: Mod assist;+2 physical assistance;+2 safety/equipment Squat pivot transfers: Mod assist;+2 safety/equipment     General transfer comment: modA squat pivot towards L side, cueing for hand placement and safety ; good adherance to TDWB     Balance Overall balance assessment: Needs assistance Sitting-balance support: Bilateral upper extremity supported;Single extremity supported;Feet supported Sitting balance-Leahy Scale: Fair Sitting balance - Comments: becomes dizzy upon sitting EOB   Standing balance support: Bilateral upper extremity supported Standing balance-Leahy Scale: Poor Standing balance comment: reliant on UE's during transfers                           ADL either performed or assessed with clinical judgement   ADL Overall ADL's : Needs assistance/impaired Eating/Feeding: Set up;Cueing for compensatory techinques   Grooming: Set up;Sitting   Upper Body Bathing: Set up;Sitting   Lower Body Bathing: Moderate assistance;+2 for physical assistance;Sitting/lateral leans   Upper Body Dressing : Set up;Sitting   Lower Body Dressing: Total assistance;+2 for physical assistance;Sitting/lateral leans   Toilet Transfer: Moderate assistance;+2 for safety/equipment;BSC;Squat-pivot Toilet Transfer Details (indicate cue type and reason): cueing for hand placement and safety during transfers  Toileting- Clothing Manipulation and Hygiene: Total assistance;+2 for safety/equipment;Sitting/lateral lean       Functional mobility during ADLs: Moderate assistance;+2 for safety/equipment;+2 for physical assistance(squat pivot only ) General ADL Comments: Pt limited by pain, TDWB R LE, and generalized weakness.      Vision Baseline Vision/History: No visual deficits Patient Visual Report: No  change from baseline Vision Assessment?: No  apparent visual deficits     Perception     Praxis      Pertinent Vitals/Pain Pain Assessment: Faces Pain Score: 0-No pain Faces Pain Scale: Hurts little more Pain Location: right hip Pain Descriptors / Indicators: Grimacing;Guarding Pain Intervention(s): Monitored during session;Repositioned     Hand Dominance Right   Extremity/Trunk Assessment Upper Extremity Assessment Upper Extremity Assessment: Generalized weakness(noted B UE tremors intermittently)   Lower Extremity Assessment Lower Extremity Assessment: Defer to PT evaluation RLE Deficits / Details: post op pain and weakness with grossly 3-/5 RLE       Communication Communication Communication: No difficulties   Cognition Arousal/Alertness: Awake/alert Behavior During Therapy: Anxious Overall Cognitive Status: Within Functional Limits for tasks assessed                                     General Comments  spouse present and supportive    Exercises     Shoulder Instructions      Home Living Family/patient expects to be discharged to:: Private residence Living Arrangements: Spouse/significant other Available Help at Discharge: Family;Available 24 hours/day Type of Home: House Home Access: Level entry     Home Layout: Two level;Able to live on main level with bedroom/bathroom     Bathroom Shower/Tub: Occupational psychologist: Handicapped height Bathroom Accessibility: Yes   Home Equipment: Environmental consultant - 2 wheels;Walker - 4 wheels;Bedside commode          Prior Functioning/Environment Level of Independence: Independent        Comments: reports independent with ADLs, mobility using RW, and limited IADLs; was about to begin [facility] services prior to admission         OT Problem List: Decreased strength;Decreased activity tolerance;Impaired balance (sitting and/or standing);Decreased safety awareness;Decreased knowledge of use of DME or AE;Pain      OT Treatment/Interventions:  Self-care/ADL training;Therapeutic exercise;Energy conservation;DME and/or AE instruction;Therapeutic activities;Patient/family education;Balance training    OT Goals(Current goals can be found in the care plan section) Acute Rehab OT Goals Patient Stated Goal: to get better OT Goal Formulation: With patient/family Time For Goal Achievement: 10/22/17 Potential to Achieve Goals: Good  OT Frequency: Min 2X/week   Barriers to D/C:            Co-evaluation              AM-PAC PT "6 Clicks" Daily Activity     Outcome Measure Help from another person eating meals?: None Help from another person taking care of personal grooming?: None(seated) Help from another person toileting, which includes using toliet, bedpan, or urinal?: Total Help from another person bathing (including washing, rinsing, drying)?: A Lot Help from another person to put on and taking off regular upper body clothing?: None Help from another person to put on and taking off regular lower body clothing?: Total 6 Click Score: 16   End of Session Equipment Utilized During Treatment: Gait belt;Oxygen Nurse Communication: Mobility status  Activity Tolerance: Patient tolerated treatment well Patient left: in chair;with call bell/phone within reach;with family/visitor present;with chair alarm set  OT Visit Diagnosis: Other abnormalities of gait and mobility (R26.89);Muscle weakness (generalized) (M62.81);History of falling (Z91.81);Pain Pain - Right/Left: Right Pain - part of body: Leg                Time: 1025-8527 OT Time Calculation (min): 52 min Charges:  OT General  Charges $OT Visit: 1 Visit OT Evaluation $OT Eval Moderate Complexity: 1 Mod OT Treatments $Self Care/Home Management : 23-37 mins  Delight Stare, OT Acute Rehabilitation Services Pager [phone] Office [phone]   Delight Stare 10/08/2017, 4:29 PM

## 2017-10-08 NOTE — Progress Notes (Signed)
LMN - Justification for Wheelchair   Patient sustained a right distal femur fracture 6 weeks after a right hip fx both requiring ORIF. Pt is TDWB and will be unable to safely leave home with RW to attend MD appointments. Pt will require a WC with elevating leg-rests and a cushion on order for her husband to safely move her out of the home.   Scheryl Marten PT, DPT

## 2017-10-08 NOTE — Evaluation (Signed)
Physical Therapy Evaluation Patient Details Name: Tracy Bowman MRN: [id number] DOB: [birth year] Today's Date: 10/08/2017   History of Present Illness  Pt is an 84 yo female A&Ox4 admitted through ED on 10/06/17 for right distal femur fx. Pt underwent an ORIF on 10/07/17. Pt had a previous fall with ORIF to right hip s/p 6 weeks. Both incidents occured when pt bend over and had vertigo like symptoms with standing up. PMH signficant for A-fib, CHF, HLD, hypothyroid, depression with anxiety.   Clinical Impression  Pt presents with the above diagnosis and below deficits for PT evaluation. Prior to admission, pt lived with her husband and had just returned home from a 4 weeks stay at [facility] in [place] following right hip fx. Pt now presents with right distal femur fx requiring ORIF. Pt's husband would like to bring patient home following inpatient stay with HHPT. Husband feels he, with the assistance of his daugther, can assist pt with care. In order to return home, pt will require further instruction on OOB mobility, gait training with TDWB and will require a WC. Pt will benefit from continued acute PT services in order to progress toward a safe return home.     Follow Up Recommendations Home health PT    Equipment Recommendations  3in1 (PT);Wheelchair (measurements PT);Wheelchair cushion (measurements PT);Other (comment)(elevating leg rests)    Recommendations for Other Services       Precautions / Restrictions Precautions Precautions: Fall Restrictions Weight Bearing Restrictions: Yes RLE Weight Bearing: Touchdown weight bearing      Mobility  Bed Mobility Overal bed mobility: Needs Assistance Bed Mobility: Rolling;Sidelying to Sit Rolling: Mod assist Sidelying to sit: Mod assist       General bed mobility comments: mod A to sit EOB and to roll for cleaning up after BM  Transfers Overall transfer level: Needs assistance Equipment used: Rolling walker (2 wheeled) Transfers: Sit  to/from [facility] Sit to Stand: +2 physical assistance;Mod assist;+2 safety/equipment Stand pivot transfers: Mod assist;+2 physical assistance;+2 safety/equipment       General transfer comment: Mod A to stand and to pivot transfer from bed to chair. Pt is able to maintain TDWB with minimal cuing this session  Ambulation/Gait             General Gait Details: not assessed this session  Stairs            Wheelchair Mobility    Modified Rankin (Stroke Patients Only)       Balance Overall balance assessment: Needs assistance Sitting-balance support: Bilateral upper extremity supported;Single extremity supported;Feet supported Sitting balance-Leahy Scale: Fair Sitting balance - Comments: becomes dizzy upon sitting EOB   Standing balance support: Bilateral upper extremity supported Standing balance-Leahy Scale: Poor Standing balance comment: reliant on UE's during transfers                             Pertinent Vitals/Pain Pain Assessment: Faces Pain Score: 0-No pain Faces Pain Scale: Hurts little more Pain Location: right hip Pain Descriptors / Indicators: Grimacing;Guarding Pain Intervention(s): Monitored during session;Premedicated before session;Repositioned    Home Living Family/patient expects to be discharged to:: Private residence Living Arrangements: Spouse/significant other Available Help at Discharge: Family;Available 24 hours/day Type of Home: House Home Access: Level entry     Home Layout: Two level;Able to live on main level with bedroom/bathroom Home Equipment: Gilford Rile - 2 wheels;Walker - 4 wheels;Bedside commode      Prior Function Level  of Independence: Independent         Comments: pt was about to start HHPT following recent d/c from SNF     Hand Dominance   Dominant Hand: Right    Extremity/Trunk Assessment   Upper Extremity Assessment Upper Extremity Assessment: Defer to OT evaluation    Lower  Extremity Assessment Lower Extremity Assessment: RLE deficits/detail RLE Deficits / Details: post op pain and weakness with grossly 3-/5 RLE       Communication   Communication: No difficulties  Cognition Arousal/Alertness: Awake/alert Behavior During Therapy: WFL for tasks assessed/performed;Anxious Overall Cognitive Status: Within Functional Limits for tasks assessed                                        General Comments General comments (skin integrity, edema, etc.): Pt reports that the room is spinning when going from supine to sit and sit stand    Exercises     Assessment/Plan    PT Assessment Patient needs continued PT services  PT Problem List Decreased strength;Decreased activity tolerance;Decreased balance;Decreased mobility;Decreased knowledge of use of DME;Pain       PT Treatment Interventions DME instruction;Gait training;Stair training;Functional mobility training;Therapeutic activities;Therapeutic exercise;Balance training;Patient/family education    PT Goals (Current goals can be found in the Care Plan section)  Acute Rehab PT Goals Patient Stated Goal: to get home PT Goal Formulation: With patient/family Time For Goal Achievement: 10/15/17 Potential to Achieve Goals: Good    Frequency Min 5X/week   Barriers to discharge        Co-evaluation               AM-PAC PT "6 Clicks" Daily Activity  Outcome Measure Difficulty turning over in bed (including adjusting bedclothes, sheets and blankets)?: Unable Difficulty moving from lying on back to sitting on the side of the bed? : Unable Difficulty sitting down on and standing up from a chair with arms (e.g., wheelchair, bedside commode, etc,.)?: Unable Help needed moving to and from a bed to chair (including a wheelchair)?: A Lot Help needed walking in hospital room?: Total Help needed climbing 3-5 steps with a railing? : Total 6 Click Score: 7    End of Session Equipment Utilized  During Treatment: Gait belt;Oxygen Activity Tolerance: Patient tolerated treatment well;Patient limited by fatigue Patient left: in chair;with call bell/phone within reach;with family/visitor present;with chair alarm set Nurse Communication: Mobility status PT Visit Diagnosis: Unsteadiness on feet (R26.81);Other abnormalities of gait and mobility (R26.89);Muscle weakness (generalized) (M62.81);History of falling (Z91.81);Pain Pain - Right/Left: Right Pain - part of body: Hip;Knee    Time: 1093-2355 PT Time Calculation (min) (ACUTE ONLY): 54 min   Charges:   PT Evaluation $PT Eval Moderate Complexity: 1 Mod PT Treatments $Therapeutic Activity: 38-52 mins        Scheryl Marten PT, DPT     Griffin Dewilde Sloan Leiter 10/08/2017, 1:40 PM

## 2017-10-09 ENCOUNTER — Other Ambulatory Visit: Payer: Self-pay

## 2017-10-09 ENCOUNTER — Encounter (HOSPITAL_COMMUNITY): Payer: Self-pay | Admitting: Orthopedic Surgery

## 2017-10-09 DIAGNOSIS — S72401A Unspecified fracture of lower end of right femur, initial encounter for closed fracture: Secondary | ICD-10-CM

## 2017-10-09 DIAGNOSIS — G8918 Other acute postprocedural pain: Secondary | ICD-10-CM

## 2017-10-09 DIAGNOSIS — S72145A Nondisplaced intertrochanteric fracture of left femur, initial encounter for closed fracture: Secondary | ICD-10-CM

## 2017-10-09 DIAGNOSIS — Z8542 Personal history of malignant neoplasm of other parts of uterus: Secondary | ICD-10-CM

## 2017-10-09 DIAGNOSIS — T148XXA Other injury of unspecified body region, initial encounter: Secondary | ICD-10-CM

## 2017-10-09 DIAGNOSIS — I5032 Chronic diastolic (congestive) heart failure: Secondary | ICD-10-CM

## 2017-10-09 DIAGNOSIS — N390 Urinary tract infection, site not specified: Secondary | ICD-10-CM

## 2017-10-09 DIAGNOSIS — F418 Other specified anxiety disorders: Secondary | ICD-10-CM

## 2017-10-09 DIAGNOSIS — D62 Acute posthemorrhagic anemia: Secondary | ICD-10-CM

## 2017-10-09 DIAGNOSIS — D72819 Decreased white blood cell count, unspecified: Secondary | ICD-10-CM

## 2017-10-09 DIAGNOSIS — S72145S Nondisplaced intertrochanteric fracture of left femur, sequela: Secondary | ICD-10-CM

## 2017-10-09 LAB — BASIC METABOLIC PANEL
Anion gap: 8 (ref 5–15)
BUN: 26 mg/dL — ABNORMAL HIGH (ref 8–23)
CALCIUM: 8 mg/dL — AB (ref 8.9–10.3)
CHLORIDE: 103 mmol/L (ref 98–111)
CO2: 27 mmol/L (ref 22–32)
CREATININE: 0.6 mg/dL (ref 0.44–1.00)
Glucose, Bld: 167 mg/dL — ABNORMAL HIGH (ref 70–99)
Potassium: 4.4 mmol/L (ref 3.5–5.1)
Sodium: 138 mmol/L (ref 135–145)

## 2017-10-09 MED ORDER — SODIUM CHLORIDE 0.9 % IV SOLN
1.0000 g | INTRAVENOUS | Status: DC
  Administered 2017-10-09 – 2017-10-10 (×2): 1 g via INTRAVENOUS
  Filled 2017-10-09 (×4): qty 10

## 2017-10-09 NOTE — Progress Notes (Signed)
Physical Therapy Treatment Patient Details Name: Tracy Bowman MRN: [id number] DOB: 09/01/1927 Today's Date: 10/09/2017    History of Present Illness Pt is an 84 yo female A&Ox4 admitted through ED on 10/06/17 for right distal femur fx. Pt underwent an ORIF on 10/07/17. Pt had a previous fall with ORIF to right hip s/p 6 weeks. Both incidents occured when pt bend over and had vertigo like symptoms with standing up. PMH signficant for A-fib, CHF, HLD, hypothyroid, depression with anxiety.     PT Comments    Pt progressing towards physical therapy goals. Noted pt did not report any dizziness or vertigo during session, however would like to assess for any vestibular involvement next session if able. Focus of session was limited to transfers to/from bedside commode. Feel pt could have progressed to ambulation this session if time allowed. Due to continued need for +2 assist for OOB mobility, feel this pt would benefit from CIR level therapies at d/c prior to return home to maximize functional independence and decrease caregiver burden. Will continue to follow.    Follow Up Recommendations  CIR;Supervision/Assistance - 24 hour     Equipment Recommendations  3in1 (PT);Wheelchair (measurements PT);Wheelchair cushion (measurements PT);Other (comment)(elevating leg rests)    Recommendations for Other Services       Precautions / Restrictions Precautions Precautions: Fall Precaution Comments: Vertigo (none on eval but has been having it frequently) Restrictions Weight Bearing Restrictions: Yes RLE Weight Bearing: Touchdown weight bearing    Mobility  Bed Mobility Overal bed mobility: Needs Assistance Bed Mobility: Supine to Sit Rolling: Mod assist Sidelying to sit: Mod assist Supine to sit: Mod assist;+2 for physical assistance     General bed mobility comments: assist for trunk elevation, use of bed pad to bring hips EOB, support of RLE for pain management  Transfers Overall transfer  level: Needs assistance Equipment used: Rolling walker (2 wheeled) Transfers: Stand Pivot Transfers;Sit to/from Stand Sit to Stand: Min assist;+2 physical assistance;+2 safety/equipment;From elevated surface(EOB and BSC) Stand pivot transfers: +2 physical assistance;+2 safety/equipment;Min assist(cues for sequencing - able to maintain TDWB) Squat pivot transfers: Mod assist;+2 safety/equipment     General transfer comment: pivot towards Left side, assist to manage RW and for balance  Ambulation/Gait             General Gait Details: not assessed this session   Stairs             Wheelchair Mobility    Modified Rankin (Stroke Patients Only)       Balance Overall balance assessment: Needs assistance Sitting-balance support: Bilateral upper extremity supported;Single extremity supported;Feet supported Sitting balance-Leahy Scale: Fair Sitting balance - Comments: no dizziness noted this session   Standing balance support: Bilateral upper extremity supported Standing balance-Leahy Scale: Poor Standing balance comment: reliant on UE's during transfers                            Cognition Arousal/Alertness: Awake/alert Behavior During Therapy: WFL for tasks assessed/performed Overall Cognitive Status: Within Functional Limits for tasks assessed                                        Exercises      General Comments        Pertinent Vitals/Pain Pain Assessment: Faces Faces Pain Scale: Hurts little more Pain Location: right hip Pain Descriptors /  Indicators: Grimacing;Guarding Pain Intervention(s): Monitored during session    Home Living Family/patient expects to be discharged to:: Unsure Living Arrangements: Spouse/significant other Available Help at Discharge: Family;Available 24 hours/day Type of Home: House Home Access: Level entry   Home Layout: Two level;Able to live on main level with bedroom/bathroom Home Equipment:  Gilford Rile - 2 wheels;Walker - 4 wheels;Bedside commode      Prior Function Level of Independence: Independent      Comments: reports independent with ADLs, mobility using RW, and limited IADLs; was about to begin [facility] services prior to admission    PT Goals (current goals can now be found in the care plan section) Acute Rehab PT Goals Patient Stated Goal: get to inpatient rehab to maximize independence PT Goal Formulation: With patient/family Time For Goal Achievement: 10/15/17 Potential to Achieve Goals: Good Progress towards PT goals: Progressing toward goals    Frequency    Min 5X/week      PT Plan Discharge plan needs to be updated    Co-evaluation PT/OT/SLP Co-Evaluation/Treatment: Yes Reason for Co-Treatment: For patient/therapist safety;To address functional/ADL transfers PT goals addressed during session: Mobility/safety with mobility;Balance;Proper use of DME        AM-PAC PT "6 Clicks" Daily Activity  Outcome Measure  Difficulty turning over in bed (including adjusting bedclothes, sheets and blankets)?: Unable Difficulty moving from lying on back to sitting on the side of the bed? : Unable Difficulty sitting down on and standing up from a chair with arms (e.g., wheelchair, bedside commode, etc,.)?: Unable Help needed moving to and from a bed to chair (including a wheelchair)?: A Lot Help needed walking in hospital room?: A Lot Help needed climbing 3-5 steps with a railing? : Total 6 Click Score: 8    End of Session Equipment Utilized During Treatment: Gait belt;Oxygen Activity Tolerance: Patient tolerated treatment well;Patient limited by fatigue Patient left: in chair;with call bell/phone within reach;with family/visitor present;with chair alarm set Nurse Communication: Mobility status PT Visit Diagnosis: Unsteadiness on feet (R26.81);Other abnormalities of gait and mobility (R26.89);Muscle weakness (generalized) (M62.81);History of falling (Z91.81);Pain Pain -  Right/Left: Right Pain - part of body: Hip;Knee     Time: 4765-4650 PT Time Calculation (min) (ACUTE ONLY): 34 min  Charges:  $Gait Training: 8-22 mins                     Rolinda Roan, PT, DPT Acute Rehabilitation Services Pager: [phone] Office: [phone]    Thelma Comp 10/09/2017, 2:48 PM

## 2017-10-09 NOTE — Progress Notes (Signed)
Occupational Therapy Treatment Patient Details Name: Tracy Bowman MRN: [id number] DOB: 10-11-1927 Today's Date: 10/09/2017    History of present illness Pt is an 84 yo female A&Ox4 admitted through ED on 10/06/17 for right distal femur fx. Pt underwent an ORIF on 10/07/17. Pt had a previous fall with ORIF to right hip s/p 6 weeks. Both incidents occured when pt bend over and had vertigo like symptoms with standing up. PMH signficant for A-fib, CHF, HLD, hypothyroid, depression with anxiety.    OT comments  Pt progressing towards OT goals this session. Required less assist for transfers and able to maintain TDWB for [facility] transfer. Still requiring +2 min A. Pt also able to participate in bathing, grooming. Pt is an excellent candidate and is eager to be as independent as possible. Pt with previous SNF stay - however she stated "They did not do as much therapy with me as I wanted" Pt eager for intensive rehab, will have 24 hour supervision from husband.   Follow Up Recommendations  Supervision/Assistance - 24 hour;CIR    Equipment Recommendations  None recommended by OT    Recommendations for Other Services      Precautions / Restrictions Precautions Precautions: Fall Restrictions Weight Bearing Restrictions: Yes RLE Weight Bearing: Touchdown weight bearing       Mobility Bed Mobility   Bed Mobility: Supine to Sit     Supine to sit: Mod assist;+2 for physical assistance     General bed mobility comments: assist for trunk elevation, use of bed pad to bring hips EOB, support of RLE for pain management  Transfers Overall transfer level: Needs assistance Equipment used: Rolling walker (2 wheeled) Transfers: Stand Pivot Transfers;Sit to/from Stand Sit to Stand: Min assist;+2 physical assistance;+2 safety/equipment;From elevated surface(EOB and BSC) Stand pivot transfers: +2 physical assistance;+2 safety/equipment;Min assist(cues for sequencing - able to maintain TDWB)        General transfer comment: pivot towards Left side, assist to manage RW and for balance    Balance Overall balance assessment: Needs assistance Sitting-balance support: Bilateral upper extremity supported;Single extremity supported;Feet supported Sitting balance-Leahy Scale: Fair Sitting balance - Comments: no dizziness noted this session   Standing balance support: Bilateral upper extremity supported Standing balance-Leahy Scale: Poor Standing balance comment: reliant on UE's during transfers                           ADL either performed or assessed with clinical judgement   ADL Overall ADL's : Needs assistance/impaired     Grooming: Set up;Sitting;Wash/dry hands;Wash/dry face Grooming Details (indicate cue type and reason): in recliner after BSC transfer     Lower Body Bathing: Moderate assistance;Sitting/lateral leans Lower Body Bathing Details (indicate cue type and reason): able to wash tops of legs to knees     Lower Body Dressing: +2 for physical assistance;Maximal assistance;+2 for safety/equipment;Sit to/from stand Lower Body Dressing Details (indicate cue type and reason): total A for socks, mod A +2 for items that require sit <>stand Toilet Transfer: +2 for safety/equipment;BSC;Minimal assistance;+2 for physical assistance;Stand-pivot;Cueing for safety;Cueing for sequencing;RW Toilet Transfer Details (indicate cue type and reason): cueing for hand placement and safety during transfers, improved ability to power up and maintain [place] precautions during transfer Toileting- Clothing Manipulation and Hygiene: +2 for safety/equipment;Maximal assistance;Sit to/from stand Toileting - Clothing Manipulation Details (indicate cue type and reason): assist for rear peri care - Pt able to perform front peri care seated on [facility]  Functional mobility during ADLs: Moderate assistance;+2 for safety/equipment;+2 for physical assistance(squat pivot only ) General ADL Comments:  Pt limited by pain, TDWB R LE, and generalized weakness.      Vision   Vision Assessment?: No apparent visual deficits   Perception     Praxis      Cognition Arousal/Alertness: Awake/alert Behavior During Therapy: WFL for tasks assessed/performed Overall Cognitive Status: Within Functional Limits for tasks assessed                                          Exercises     Shoulder Instructions       General Comments      Pertinent Vitals/ Pain       Pain Assessment: Faces Faces Pain Scale: Hurts little more Pain Location: right hip Pain Descriptors / Indicators: Grimacing;Guarding Pain Intervention(s): Monitored during session;Repositioned;Premedicated before session  Home Living Family/patient expects to be discharged to:: Unsure Living Arrangements: Spouse/significant other                                      Prior Functioning/Environment              Frequency  Min 2X/week        Progress Toward Goals  OT Goals(current goals can now be found in the care plan section)  Progress towards OT goals: Progressing toward goals  Acute Rehab OT Goals Patient Stated Goal: get to inpatient rehab to maximize independence OT Goal Formulation: With patient Time For Goal Achievement: 10/22/17 Potential to Achieve Goals: Good  Plan Discharge plan remains appropriate;Frequency remains appropriate    Co-evaluation                 AM-PAC PT "6 Clicks" Daily Activity     Outcome Measure   Help from another person eating meals?: None Help from another person taking care of personal grooming?: None(seated) Help from another person toileting, which includes using toliet, bedpan, or urinal?: A Lot Help from another person bathing (including washing, rinsing, drying)?: A Lot Help from another person to put on and taking off regular upper body clothing?: None Help from another person to put on and taking off regular lower body  clothing?: A Lot 6 Click Score: 18    End of Session Equipment Utilized During Treatment: Gait belt;Rolling walker  OT Visit Diagnosis: Other abnormalities of gait and mobility (R26.89);Muscle weakness (generalized) (M62.81);History of falling (Z91.81);Pain Pain - Right/Left: Right Pain - part of body: Leg   Activity Tolerance Patient tolerated treatment well   Patient Left in chair;with call bell/phone within reach;with chair alarm set   Nurse Communication Mobility status        Time: 2130-8657 OT Time Calculation (min): 34 min  Charges: OT General Charges $OT Visit: 1 Visit OT Treatments $Self Care/Home Management : 8-22 mins  Hulda Humphrey OTR/L Acute Rehabilitation Services Pager: [phone] Office: [place] 10/09/2017, 2:38 PM

## 2017-10-09 NOTE — Care Management Important Message (Signed)
Important Message  Patient Details  Name: Tracy Bowman MRN: [id number] Date of Birth: Dec 17, 1927   Medicare Important Message Given:  Yes    Tracy Bowman 10/09/2017, 4:08 PM

## 2017-10-09 NOTE — Progress Notes (Signed)
[facility] PROGRESS NOTE  ARNETHA SILVERTHORNE JKD:[id number] DOB: October 16, 1927 DOA: 10/06/2017 PCP: Neale Burly, MD  Brief summary   84 y.o.femalewith past medical history significant for atrial fibrillation not on anticoagulants, dCHF with EF 60-65%,remote endometrial cancer(s/p ofsurgery and chemotherapy, no radiation therapy per patient),hyperlipidemia, hypothyroidism, depression with anxiety, s/p ofright hip fracture surgery 6 weeks ago.  Patient presented with fall and rightknee pain.Patient was [facility] due to right distal femoral fracture. Orthopedic team was consulted.Patient underwent open reduction and internal fixation of right distal femoral fracture on 10/07/2017. Awaiting rehab eval   Assessment/Plan:  Closed fracture of right distal femur (HCC):Orthopedic surgeon was consulted. Pt underwent ORIF of right distal femoral fractureon 10/07/2017. Obtain pt/ot, possible rehab at discharge   Atrial Fibrillation: CHA2DS2-VASc Scoreis 4. Per hercardiologist, Dr. Lora Havens note, patient was reluctant to take blood thinner, and also because that the patient has high risk of fall,therefore patient is not on anticoagulants. Heart rate is well controlled. Cont Cardizem  Chronic diastolic (congestive) heart failure (HCC):2D echodone in [facility] 04/16/2014 showed EF60-65%. Patient has trace leg edema, but no JVD or respiratory distress. CHF seems to be compensated.  Depression and anxiety:Stable, no suicidal or homicidal ideations. continue home medications:Zoloft, Remeron, Xanax  Hypothyroidism: Continue Synthroid  Reported mild chest pain:Pt denies any chest pains to me. Troponin  is negative. Cont Aspirin, PRN nitroglycerin.   UTI. Started on ceftriaxone. Afebrile. Monitor    Code Status: full Family Communication: d/w patient (indicate person spoken with, relationship, and if by phone, the number) Disposition Plan:  pend acute rehab eval    Consultants:  Ortho  rehab  Procedures:  Hip surgery   Antibiotics: Anti-infectives (From admission, onward)   Start     Dose/Rate Route Frequency Ordered Stop   10/07/17 1700  ceFAZolin (ANCEF) IVPB 1 g/50 mL premix     1 g 100 mL/hr over 30 Minutes Intravenous Every 8 hours 10/07/17 1453 10/08/17 0934   10/07/17 0900  ceFAZolin (ANCEF) IVPB 2g/100 mL premix     2 g 200 mL/hr over 30 Minutes Intravenous On call 10/07/17 0847 10/07/17 0925   10/07/17 0848  ceFAZolin (ANCEF) 2-4 GM/100ML-% IVPB    Note to Pharmacy:  Henrine Screws   : cabinet override      10/07/17 0848 10/07/17 0925        (indicate start date, and stop date if known)  HPI/Subjective: No distress. She reports mild post op pain. No focal weakness   Objective: Vitals:   10/08/17 2023 10/09/17 0528  BP: 127/70 139/68  Pulse: 80 78  Resp:    Temp: 98.2 F (36.8 C) 98.2 F (36.8 C)  SpO2: 98% 95%    Intake/Output Summary (Last 24 hours) at 10/09/2017 1304 Last data filed at 10/09/2017 1100 Gross per 24 hour  Intake 451.53 ml  Output 800 ml  Net -348.47 ml   There were no vitals filed for this visit.  Exam:   General:  No distress   Cardiovascular: s1,s2 rrr  Respiratory: CAT BL  Abdomen: soft, nt   Musculoskeletal: no leg edema    Data Reviewed: Basic Metabolic Panel: Recent Labs  Lab 10/07/17 1053 10/07/17 1505 10/08/17 0812 10/09/17 0628  NA 137  --  131* 138  K 4.2  --  4.6 4.4  CL  --   --  98 103  CO2  --   --  25 27  GLUCOSE  --   --  307* 167*  BUN  --   --  27* 26*  CREATININE  --  0.66 0.88 0.60  CALCIUM  --   --  7.9* 8.0*   Liver Function Tests: No results for input(s): AST, ALT, ALKPHOS, BILITOT, PROT, ALBUMIN in the last 168 hours. No results for input(s): LIPASE, AMYLASE in the last 168 hours. No results for input(s): AMMONIA in the last 168 hours. CBC: Recent Labs  Lab 10/07/17 1053 10/07/17 1407 10/07/17 1505 10/08/17 0812   WBC  --  13.8* 13.7* 14.1*  HGB 10.5* 9.6* 9.8* 8.2*  HCT 31.0* 30.1* 30.3* 26.1*  MCV  --  101.7* 101.3* 102.0*  PLT  --  152 150 150   Cardiac Enzymes: Recent Labs  Lab 10/07/17 0356  TROPONINI <0.03   BNP (last 3 results) Recent Labs    10/07/17 0051  BNP 50.2    ProBNP (last 3 results) No results for input(s): PROBNP in the last 8760 hours.  CBG: No results for input(s): GLUCAP in the last 168 hours.  Recent Results (from the past 240 hour(s))  Surgical pcr screen     Status: None   Collection Time: 10/07/17  2:04 AM  Result Value Ref Range Status   MRSA, PCR NEGATIVE NEGATIVE Final   Staphylococcus aureus NEGATIVE NEGATIVE Final    Comment: (NOTE) The Xpert SA Assay (FDA approved for NASAL specimens in patients 71 years of age and older), is one component of a comprehensive surveillance program. It is not intended to diagnose infection nor to guide or monitor treatment. Performed at [facility], [place] [address] [place], [place] [address]      Studies: Dg Femur [place], [place] 2 Views Right  Result Date: 10/07/2017 CLINICAL DATA:  84 year old female status post ORIF right hip/femur EXAM: RIGHT FEMUR PORTABLE 2 VIEW COMPARISON:  10/06/2017 FINDINGS: Interval surgical changes of open reduction internal fixation of a distal femur fracture, with buttress plate and screw fixation with cerclage wires, and reduction of the deformity, with anatomic alignment maintained currently. Redemonstration of surgical changes of antegrade intramedullary rod and gamma nail of the right hip. Soft tissue swelling with gas in the surgical bed. Osteopenia. IMPRESSION: Early postoperative changes of open reduction internal fixation of the distal femur fracture with buttress plate and screw and cerclage wire fixation with improved alignment. Redemonstration of prior hip fixation, as above. Electronically Signed   By: Corrie Mckusick D.O.   On: 10/07/2017 13:38    Scheduled Meds: .  acetaminophen  650 mg Oral Q8H  . aspirin EC  81 mg Oral Daily  . diltiazem  120 mg Oral Daily  . docusate sodium  100 mg Oral BID  . enoxaparin (LOVENOX) injection  40 mg Subcutaneous Q24H  . feeding supplement  237 mL Oral BID BM  . levothyroxine  25 mcg Oral QAC breakfast  . mirtazapine  15 mg Oral QHS  . omega-3 acid ethyl esters  1 g Oral Daily  . sertraline  50 mg Oral Daily  . vitamin B-12  1,000 mcg Oral Daily   Continuous Infusions: . lactated ringers 10 mL/hr at 10/08/17 1324    Principal Problem:   Closed fracture of right distal femur (HCC) Active Problems:   Atrial fibrillation (HCC)   Hyperlipidemia   Chronic atrial fibrillation   Chronic diastolic (congestive) heart failure (HCC)   Depression with anxiety   Hypothyroidism   Chest pain   Fracture   Chronic diastolic congestive heart failure ([place])   History of endometrial  cancer   Closed nondisplaced intertrochanteric fracture of left femur (HCC)   Acute blood loss anemia   Leukopenia   Post-operative pain    Time spent: >35 minutes     Kinnie Feil  [facility] Pager [phone]. If 7PM-7AM, please contact night-coverage at [email or web address], password [facility] 10/09/2017, 1:04 PM  LOS: 3 days

## 2017-10-09 NOTE — Consult Note (Signed)
Physical Medicine and Rehabilitation Consult Reason for Consult: Decreased functional mobility Referring Physician: [facility]   HPI: Tracy Bowman is a 84 y.o. right-handed female with history of chronic diastolic congestive heart failure, atrial fibrillation not on anticoagulants, remote endometrial cancer status post surgery and chemotherapy as well as recent right hip fracture status post IM nailing 6 weeks ago and spent 4 weeks at a skilled nursing facility in [place] before returning home with her husband receiving home health therapies, requiring assitance for ADLs.  History taken from chart review and patient. One level home with basement.  Husband with limited physical assistance.  No other local family.  Presented 10/07/2017 after a fall when she lost her balance.  No loss of conscious.  Initially presented to [facility] where x-rays revealed a closed right distal femur fracture.  Patient was transferred to [facility] and underwent ORIF 10/07/2017 per Dr. Marcelino Scot.  Touchdown weightbearing right lower extremity.  Acute blood loss anemia 8.2.  Subcutaneous Lovenox for DVT prophylaxis.  Therapy evaluations completed with recommendations of physical medicine rehab consult.  Review of Systems  Constitutional: Negative for chills and fever.  HENT: Negative for hearing loss.   Eyes: Negative for blurred vision and double vision.  Respiratory: Negative for cough and shortness of breath.   Cardiovascular: Negative for chest pain and leg swelling.  Gastrointestinal: Positive for constipation. Negative for nausea.  Genitourinary: Negative for dysuria, flank pain and hematuria.  Musculoskeletal: Positive for falls and myalgias.  Skin: Negative for rash.  Psychiatric/Behavioral: Positive for depression.       Anxiety  All other systems reviewed and are negative.  Past Medical History:  Diagnosis Date  . Anxiety neurosis   . Chronic atrial fibrillation   . Chronic  diastolic (congestive) heart failure ([place])   . Depression with anxiety   . Hyperlipidemia   . Hypothyroidism    No past surgical history on file. Family History  Problem Relation Age of Onset  . Colon cancer Father   . Congestive Heart Failure Mother   . Dementia Brother   . Breast cancer Sister    Social History:  reports that she has never smoked. She has never used smokeless tobacco. She reports that she does not drink alcohol or use drugs. Allergies:  Allergies  Allergen Reactions  . Morphine Other (See Comments)    hallucinations   . Oxycodone Other (See Comments)    Pt does not tolerate well extreme dry mouth     Medications Prior to Admission  Medication Sig Dispense Refill  . acetaminophen (TYLENOL) 325 MG tablet Take 650 mg by mouth every 6 (six) hours as needed.    Marland Kitchen alendronate (FOSAMAX) 70 MG tablet Take 70 mg by mouth once a week.   0  . ALPRAZolam (XANAX) 0.5 MG tablet Take 0.5 mg by mouth at bedtime as needed.   0  . celecoxib (CELEBREX) 200 MG capsule Take 200 mg by mouth 2 (two) times daily.  0  . CVS VITAMIN B12 1000 MCG tablet Take 1,000 mcg by mouth daily.  0  . diltiazem (TIAZAC) 120 MG 24 hr capsule Take 120 mg by mouth daily.  0  . furosemide (LASIX) 20 MG tablet Take 20 mg by mouth daily.     Marland Kitchen levothyroxine (SYNTHROID, LEVOTHROID) 25 MCG tablet Take 25 mcg by mouth daily before breakfast.     . Magnesium 200 MG TABS Take 1 tablet by mouth daily.  0  .  meclizine (ANTIVERT) 12.5 MG tablet Take 12.5 mg by mouth 3 (three) times daily as needed for dizziness.  2  . midodrine (PROAMATINE) 2.5 MG tablet Take 2.5 mg by mouth 2 (two) times daily.  2  . mirtazapine (REMERON) 15 MG tablet Take by mouth.    . Multiple Vitamins-Minerals (CVS ONE DAILY ESSENTIAL) TABS Take 1 tablet by mouth daily.  0  . Omega-3 Fatty Acids (FISH OIL) 1000 MG CAPS Take 1,000 mg by mouth 2 (two) times daily.     . sertraline (ZOLOFT) 50 MG tablet Take 50 mg by mouth daily.  0     Home: Home Living Family/patient expects to be discharged to:: Private residence Living Arrangements: Spouse/significant other Available Help at Discharge: Family, Available 24 hours/day Type of Home: House Home Access: Level entry [place]: Two level, Able to live on main level with bedroom/bathroom Bathroom Shower/Tub: Multimedia programmer: Handicapped height Bathroom Accessibility: Yes Home Equipment: Environmental consultant - 2 wheels, Walker - 4 wheels, Bedside commode  Functional History: Prior Function Level of Independence: Independent Comments: reports independent with ADLs, mobility using RW, and limited IADLs; was about to begin [facility] services prior to admission  Functional Status:  Mobility: Bed Mobility Overal bed mobility: Needs Assistance Bed Mobility: Rolling, Sidelying to Sit Rolling: Mod assist Sidelying to sit: Mod assist General bed mobility comments: seated in recliner upon entry Transfers Overall transfer level: Needs assistance Equipment used: Rolling walker (2 wheeled) Transfers: Squat Pivot Transfers Sit to Stand: +2 physical assistance, Mod assist, +2 safety/equipment Stand pivot transfers: Mod assist, +2 physical assistance, +2 safety/equipment Squat pivot transfers: Mod assist, +2 safety/equipment General transfer comment: modA squat pivot towards L side, cueing for hand placement and safety ; good adherance to TDWB  Ambulation/Gait General Gait Details: not assessed this session    ADL: ADL Overall ADL's : Needs assistance/impaired Eating/Feeding: Set up, Cueing for compensatory techinques Grooming: Set up, Sitting Upper Body Bathing: Set up, Sitting Lower Body Bathing: Moderate assistance, +2 for physical assistance, Sitting/lateral leans Upper Body Dressing : Set up, Sitting Lower Body Dressing: Total assistance, +2 for physical assistance, Sitting/lateral leans Toilet Transfer: Moderate assistance, +2 for safety/equipment, BSC,  Squat-pivot Toilet Transfer Details (indicate cue type and reason): cueing for hand placement and safety during transfers  Toileting- Clothing Manipulation and Hygiene: Total assistance, +2 for safety/equipment, Sitting/lateral lean Functional mobility during ADLs: Moderate assistance, +2 for safety/equipment, +2 for physical assistance(squat pivot only ) General ADL Comments: Pt limited by pain, TDWB R LE, and generalized weakness.   Cognition: Cognition Overall Cognitive Status: Within Functional Limits for tasks assessed Orientation Level: Oriented to person, Oriented to situation, Disoriented to place, Disoriented to time Cognition Arousal/Alertness: Awake/alert Behavior During Therapy: Anxious Overall Cognitive Status: Within Functional Limits for tasks assessed  Blood pressure 139/68, pulse 78, temperature 98.2 F (36.8 C), temperature source Oral, resp. rate 20, SpO2 95 %. Physical Exam  Vitals reviewed. Constitutional: She is oriented to person, place, and time. She appears well-developed and well-nourished.  HENT:  Head: Normocephalic and atraumatic.  Eyes: EOM are normal. Right eye exhibits no discharge. Left eye exhibits no discharge.  Neck: Normal range of motion. Neck supple. No thyromegaly present.  Cardiovascular:  Irregularly irregular  Respiratory: Effort normal and breath sounds normal. No respiratory distress.  GI: Soft. Bowel sounds are normal. She exhibits no distension.  Musculoskeletal:  Right leg edema and tenderness Motor: B/l UE: 4+/5 proximal to distal LLE: 4+/5 proximal to distal RLE: 2/5 proximal to  distal Sensation intact to light touch HOH B/l UE tremors  Neurological: She is alert and oriented to person, place, and time.  Skin:  Hip leg with dressing  Psychiatric: Her affect is blunt. Her speech is delayed. She is slowed.    Results for orders placed or performed during the hospital encounter of 10/06/17 (from the past 24 hour(s))  Basic  metabolic panel     Status: Abnormal   Collection Time: 10/08/17  8:12 AM  Result Value Ref Range   Sodium 131 (L) 135 - 145 mmol/L   Potassium 4.6 3.5 - 5.1 mmol/L   Chloride 98 98 - 111 mmol/L   CO2 25 22 - 32 mmol/L   Glucose, Bld 307 (H) 70 - 99 mg/dL   BUN 27 (H) 8 - 23 mg/dL   Creatinine, Ser 0.88 0.44 - 1.00 mg/dL   Calcium 7.9 (L) 8.9 - 10.3 mg/dL   GFR calc non Af Amer 57 (L) >60 mL/min   GFR calc Af Amer >60 >60 mL/min   Anion gap 8 5 - 15  CBC     Status: Abnormal   Collection Time: 10/08/17  8:12 AM  Result Value Ref Range   WBC 14.1 (H) 4.0 - 10.5 K/uL   RBC 2.56 (L) 3.87 - 5.11 MIL/uL   Hemoglobin 8.2 (L) 12.0 - 15.0 g/dL   HCT 26.1 (L) 36.0 - 46.0 %   MCV 102.0 (H) 78.0 - 100.0 fL   MCH 32.0 26.0 - 34.0 pg   MCHC 31.4 30.0 - 36.0 g/dL   RDW 13.1 11.5 - 15.5 %   Platelets 150 150 - 400 K/uL  Basic metabolic panel     Status: Abnormal   Collection Time: 10/09/17  6:28 AM  Result Value Ref Range   Sodium 138 135 - 145 mmol/L   Potassium 4.4 3.5 - 5.1 mmol/L   Chloride 103 98 - 111 mmol/L   CO2 27 22 - 32 mmol/L   Glucose, Bld 167 (H) 70 - 99 mg/dL   BUN 26 (H) 8 - 23 mg/dL   Creatinine, Ser 0.60 0.44 - 1.00 mg/dL   Calcium 8.0 (L) 8.9 - 10.3 mg/dL   GFR calc non Af Amer >60 >60 mL/min   GFR calc Af Amer >60 >60 mL/min   Anion gap 8 5 - 15   Dg Chest Port 1 View  Result Date: 10/07/2017 CLINICAL DATA:  Low O2 sats, preop EXAM: PORTABLE CHEST 1 VIEW COMPARISON:  08/28/2017 FINDINGS: Cardiomegaly. Lungs clear. No effusions or edema. No acute bony abnormality. IMPRESSION: Cardiomegaly.  No active disease. Electronically Signed   By: Rolm Baptise M.D.   On: 10/07/2017 08:24   Dg C-arm 1-60 Min  Result Date: 10/07/2017 CLINICAL DATA:  ORIF distal femoral fracture EXAM: RIGHT FEMUR 2 VIEWS; DG C-ARM 61-120 MIN COMPARISON:  10/06/2017 FINDINGS: Plate and screw fixation noted across the distal right femoral fracture. Cerclage wire also placed. Final image  demonstrates continued mild displacement of the distal fragment posteriorly on the lateral view. IMPRESSION: Internal fixation of the distal femoral fracture as above. Electronically Signed   By: Rolm Baptise M.D.   On: 10/07/2017 12:12   Dg C-arm 1-60 Min  Result Date: 10/07/2017 CLINICAL DATA:  ORIF distal femoral fracture EXAM: RIGHT FEMUR 2 VIEWS; DG C-ARM 61-120 MIN COMPARISON:  10/06/2017 FINDINGS: Plate and screw fixation noted across the distal right femoral fracture. Cerclage wire also placed. Final image demonstrates continued mild displacement of the distal fragment posteriorly  on the lateral view. IMPRESSION: Internal fixation of the distal femoral fracture as above. Electronically Signed   By: Rolm Baptise M.D.   On: 10/07/2017 12:12   Dg Femur, Min 2 Views Right  Result Date: 10/07/2017 CLINICAL DATA:  ORIF distal femoral fracture EXAM: RIGHT FEMUR 2 VIEWS; DG C-ARM 61-120 MIN COMPARISON:  10/06/2017 FINDINGS: Plate and screw fixation noted across the distal right femoral fracture. Cerclage wire also placed. Final image demonstrates continued mild displacement of the distal fragment posteriorly on the lateral view. IMPRESSION: Internal fixation of the distal femoral fracture as above. Electronically Signed   By: Rolm Baptise M.D.   On: 10/07/2017 12:12   Dg Femur Port, Min 2 Views Right  Result Date: 10/07/2017 CLINICAL DATA:  84 year old female status post ORIF right hip/femur EXAM: RIGHT FEMUR PORTABLE 2 VIEW COMPARISON:  10/06/2017 FINDINGS: Interval surgical changes of open reduction internal fixation of a distal femur fracture, with buttress plate and screw fixation with cerclage wires, and reduction of the deformity, with anatomic alignment maintained currently. Redemonstration of surgical changes of antegrade intramedullary rod and gamma nail of the right hip. Soft tissue swelling with gas in the surgical bed. Osteopenia. IMPRESSION: Early postoperative changes of open reduction  internal fixation of the distal femur fracture with buttress plate and screw and cerclage wire fixation with improved alignment. Redemonstration of prior hip fixation, as above. Electronically Signed   By: Corrie Mckusick D.O.   On: 10/07/2017 13:38    Assessment/Plan: Diagnosis: Right femurs fracture Labs and images (see above) independently reviewed.  Records reviewed and summated above.  1. Does the need for close, 24 hr/day medical supervision in concert with the patient's rehab needs make it unreasonable for this patient to be served in a less intensive setting? Potentially  2. Co-Morbidities requiring supervision/potential complications: chronic diastolic congestive heart failure (monitor for signs/symptoms of fluid overload), atrial fibrillation (cont meds, monitor HR with increased mobility), remote endometrial cancer status post surgery and chemotherapy, recent right hip fracture status post IM nailing, Acute blood loss anemia (transfuse if necessary to ensure appropriate perfusion for increased activity tolerance), leukocytosis (cont to monitor for signs and symptoms of infection, further workup if indicated) 3. Due to bladder management, bowel management, safety, skin/wound care, disease management, pain management and patient education, does the patient require 24 hr/day rehab nursing? Potentially 4. Does the patient require coordinated care of a physician, rehab nurse, PT (1-2 hrs/day, 5 days/week) and OT (1-2 hrs/day, 5 days/week) to address physical and functional deficits in the context of the above medical diagnosis(es)? Yes Addressing deficits in the following areas: balance, endurance, locomotion, strength, transferring, bathing, dressing, toileting and psychosocial support 5. Can the patient actively participate in an intensive therapy program of at least 3 hrs of therapy per day at least 5 days per week? Potentially 6. The potential for patient to make measurable gains while on  inpatient rehab is excellent 7. Anticipated functional outcomes upon discharge from inpatient rehab are supervision and min assist  with PT, supervision and min assist with OT, n/a with SLP. 8. Estimated rehab length of stay to reach the above functional goals is: 14-17 days. 9. Anticipated D/C setting: Home 10. Anticipated post D/C treatments: HH therapy and Home excercise program 11. Overall Rehab/Functional Prognosis: good  RECOMMENDATIONS: This patient's condition is appropriate for continued rehabilitative care in the following setting: Recommend reeval by therapies.  If patients continues to have significant deficits and adequate caregiver support avialable, recommend CIR once medically stable.  Patient has agreed to participate in recommended program. Potentially Note that insurance prior authorization may be required for reimbursement for recommended care.  Comment: Rehab Admissions Coordinator to follow up.   I have personally performed a face to face diagnostic evaluation, including, but not limited to relevant history and physical exam findings, of this patient and developed relevant assessment and plan.  Additionally, I have reviewed and concur with the physician assistant's documentation above.   Delice Lesch, MD, ABPMR Lavon Paganini Angiulli, PA-C 10/09/2017

## 2017-10-09 NOTE — Plan of Care (Signed)

## 2017-10-09 NOTE — Progress Notes (Signed)
Inpatient Rehabilitation-Admissions Coordinator    Met with patient at the bedside to discuss team's recommendation for inpatient rehabilitation. Shared booklets, expectations while in CIR, expected length of stay, and anticipated functional level at DC. Pt feels her husband can assist her at DC but wants to discuss with him prior to making any decisions about her rehab plans.   AC has attempted to get in contact with pt's husband; still awaiting call back.   Will continue to follow along for medical readiness and venue preference once decision had been made.   Jhonnie Garner, OTR/L  Rehab Admissions Coordinator  [phone] 10/09/2017 1:12 PM

## 2017-10-09 NOTE — Progress Notes (Signed)
Inpatient Rehabilitation-Admissions Coordinator   Spoke with pt and her husband as follow up regarding CIR. Pt and her husband want to have rehab closer to [place], where they live. They have two SNF placements in mind. AC has communicated SNF preference to CM and SW.   AC will sign off.   Please call if questions.   Jhonnie Garner, OTR/L  Rehab Admissions Coordinator  [phone] 10/09/2017 4:27 PM

## 2017-10-10 LAB — BASIC METABOLIC PANEL
ANION GAP: 7 (ref 5–15)
BUN: 19 mg/dL (ref 8–23)
CO2: 28 mmol/L (ref 22–32)
Calcium: 7.7 mg/dL — ABNORMAL LOW (ref 8.9–10.3)
Chloride: 105 mmol/L (ref 98–111)
Creatinine, Ser: 0.54 mg/dL (ref 0.44–1.00)
GFR calc Af Amer: >60 mL/min (ref >60)
GLUCOSE: 136 mg/dL — AB (ref 70–99)
POTASSIUM: 4 mmol/L (ref 3.5–5.1)
Sodium: 140 mmol/L (ref 135–145)

## 2017-10-10 MED ORDER — WHITE PETROLATUM EX OINT
TOPICAL_OINTMENT | CUTANEOUS | Status: AC
  Filled 2017-10-10: qty 28.35

## 2017-10-10 MED ORDER — ALPRAZOLAM 0.5 MG PO TABS
0.5000 mg | ORAL_TABLET | Freq: Two times a day (BID) | ORAL | Status: DC | PRN
  Administered 2017-10-10 (×2): 0.5 mg via ORAL
  Filled 2017-10-10 (×3): qty 1

## 2017-10-10 NOTE — Consult Note (Signed)
            [facility] CM Primary Care Navigator  10/10/2017  Tracy Bowman Dec 09, 1927 [id number]   Went to see patient atthe bedside to identify possible discharge needs.  Patient reports that she "bent over, felt dizzy, passed out, fell and broke right leg" that resulted to thisadmission/ surgery.(closed fracture of right distal femur, underwent open reduction internal fixation of right distal femoral fracture)  Patient endorsesDr. Stoney Bang with [facility] in [facility] primary care provider.   [facility] [place] [facility] difficulty so far.  Patient's husband (Tracy Bowman)has beenmanaginghermedications at home, straight out of the containers.  Her husband has been providing transportation to her doctors' appointments.  Patient's husbandwill be serving as her primary caregiver at home and her stepdaughters Tracy Bowman and Tracy Bowman) will be able to assist with her care if needed, per patient.   Anticipated plan for discharge isto be determined between [place] vs. SNF skilled nursing facility.  Patientvoiced understandingto callprimary careprovider'soffice when she returns backhomefor a post discharge follow-upvisitwithin1- 2 weeksor sooner if needs arise.Patient letter (with PCP's contact number) was provided as areminder.   Explained topatientregarding [facility] CM services available for health management andresourcesat homeand seemed interested about it.  Patient states will discuss with primary care provider on her next visit, for further needs and assistance in managing her health issues- once she gets home. Patienthadverbalizedunderstandingof needto seekreferral from primary care provider to [facility] care management ifdeemed necessary and appropriatefor anyservicesin the future- when she isback home.   [facility] care management information was  provided for futureneeds thatshemay have.  Primary care provider's office is listed as providing transition of care (TOC) follow-up.   For additional questions please contact:  Edwena Felty A. Chantry Headen, BSN, RN-BC [facility] PRIMARY CARE Navigator Cell: [phone]

## 2017-10-10 NOTE — Plan of Care (Signed)
  Problem: Education: Goal: Knowledge of General Education information will improve Description: Including pain rating scale, medication(s)/side effects and non-pharmacologic comfort measures Outcome: Progressing   Problem: Health Behavior/Discharge Planning: Goal: Ability to manage health-related needs will improve Outcome: Progressing   Problem: Clinical Measurements: Goal: Will remain free from infection Outcome: Progressing Goal: Diagnostic test results will improve Outcome: Progressing   Problem: Nutrition: Goal: Adequate nutrition will be maintained Outcome: Progressing   Problem: Elimination: Goal: Will not experience complications related to bowel motility Outcome: Progressing   

## 2017-10-10 NOTE — Progress Notes (Addendum)
CSW received feedback from [facility] that they are declinging to accept patient at this time, as patient had used all of her days at her most recent stay with them from 8/31-9/28. Patient's second choice is [facility], faxed over clinicals. Staff report their system is currently down however they will review and see if they would be able to accept patient, since her medicare days are used she will be using secondary insurance of [employer].   CSW called patient spouse to assess for further options, as the two faciltiies patient listed are not currently giving a bed offer. CSW left voicemail with spouse as there was no answer.   CSW will continue to follow up with [facility] while encouraging patient to come up with other options she would like to go to.   CSW spoke with patient regarding if [facility] does not accept, patient reports she will go home then as she does not identify any other SNFs as options at this time.    [place], [place]

## 2017-10-10 NOTE — Progress Notes (Signed)
Physical Therapy Treatment Patient Details Name: Tracy Bowman MRN: [id number] DOB: 06-17-1927 Today's Date: 10/10/2017    History of Present Illness Pt is an 84 yo female A&Ox4 admitted through ED on 10/06/17 for right distal femur fx. Pt underwent an ORIF on 10/07/17. Pt had a previous fall with ORIF to right hip s/p 6 weeks. Both incidents occured when pt bend over and had vertigo like symptoms with standing up. PMH signficant for A-fib, CHF, HLD, hypothyroid, depression with anxiety.     PT Comments    Pt admitted with above diagnosis. Pt currently with functional limitations due to the deficits listed below (see PT Problem List). Pt tested for vestibular issues with difficult assessment as it was difficult to ilicit symptoms.  Given that pt had symptoms with hallpike dix left (using bed to get position since pt is a hip fracture), treated for left BPPV.  After treatment, pt did vomit but unsure if that was due to treatment vs. Pt felt a sour stomach after drinking Ensure this am.  Given that pt vomited, did not continue to educate regarding x1 exercises. Suspect that pt has a left hypofunction given that she has suffered from vertigo for years off and on.  Also did see right beating nystagmus with suspicion that right side is stronger than left.   Therapy Will stop back by in pm to ensure that pt can perform x 1 exercises correctly and that she understands how frequent to perform them.  Will continue PT as pt tolerates.   Pt will benefit from skilled PT to increase their independence and safety with mobility to allow discharge to the venue listed below.     Follow Up Recommendations  Supervision/Assistance - 24 hour;SNF     Equipment Recommendations  3in1 (PT);Wheelchair (measurements PT);Wheelchair cushion (measurements PT);Other (comment)(elevating leg rests)    Recommendations for Other Services       Precautions / Restrictions Precautions Precautions: Fall Restrictions Weight Bearing  Restrictions: Yes RLE Weight Bearing: Touchdown weight bearing    Mobility  Bed Mobility Overal bed mobility: Needs Assistance Bed Mobility: Supine to Sit Rolling: Mod assist Sidelying to sit: Mod assist Supine to sit: Mod assist;+2 for physical assistance     General bed mobility comments: assist for trunk elevation, use of bed pad to bring hips EOB, support of RLE for pain management  Transfers Overall transfer level: Needs assistance Equipment used: Rolling walker (2 wheeled) Transfers: Stand Pivot Transfers;Sit to/from Stand Sit to Stand: +2 physical assistance;+2 safety/equipment;From elevated surface;Min assist;Mod assist(EOB and BSC) Stand pivot transfers: +2 physical assistance;+2 safety/equipment;Min assist;Mod assist(cues for sequencing - cues to maintain TDWB)       General transfer comment: pivot towards Left side, assist to manage RW and for balance  Ambulation/Gait             General Gait Details: not assessed this session   Stairs             Wheelchair Mobility    Modified Rankin (Stroke Patients Only)       Balance Overall balance assessment: Needs assistance Sitting-balance support: Bilateral upper extremity supported;Single extremity supported;Feet supported Sitting balance-Leahy Scale: Fair     Standing balance support: Bilateral upper extremity supported Standing balance-Leahy Scale: Poor Standing balance comment: reliant on UE's during transfers                            Cognition Arousal/Alertness: Awake/alert Behavior During Therapy: [facility]  for tasks assessed/performed Overall Cognitive Status: Within Functional Limits for tasks assessed                                        Exercises Other Exercises Other Exercises: x1 exercises initiated    General Comments General comments (skin integrity, edema, etc.): See vestibular assessment.  did treat pt with canalith repositioning for left BPPV.   After treatment assisted pt onto toilet.  Nurse brought meds in .  Pt vomited not long after this.  Was ok at end of treatment sitting in recliner      Pertinent Vitals/Pain Pain Assessment: Faces Faces Pain Scale: Hurts even more Pain Location: right hip Pain Descriptors / Indicators: Grimacing;Guarding Pain Intervention(s): Limited activity within patient's tolerance;Monitored during session;Repositioned;Patient requesting pain meds-RN notified;RN gave pain meds during session    Home Living                      Prior Function            PT Goals (current goals can now be found in the care plan section) Acute Rehab PT Goals Patient Stated Goal: get to inpatient rehab to maximize independence Progress towards PT goals: Progressing toward goals    Frequency    Min 3X/week      PT Plan Discharge plan needs to be updated;Frequency needs to be updated    Co-evaluation              AM-PAC PT "6 Clicks" Daily Activity  Outcome Measure  Difficulty turning over in bed (including adjusting bedclothes, sheets and blankets)?: Unable Difficulty moving from lying on back to sitting on the side of the bed? : Unable Difficulty sitting down on and standing up from a chair with arms (e.g., wheelchair, bedside commode, etc,.)?: Unable Help needed moving to and from a bed to chair (including a wheelchair)?: A Lot Help needed walking in hospital room?: A Lot Help needed climbing 3-5 steps with a railing? : Total 6 Click Score: 8    End of Session Equipment Utilized During Treatment: Gait belt Activity Tolerance: Patient limited by fatigue(limited by dizziness and vomiting) Patient left: in chair;with call bell/phone within reach;with chair alarm set Nurse Communication: Mobility status PT Visit Diagnosis: Unsteadiness on feet (R26.81);Other abnormalities of gait and mobility (R26.89);Muscle weakness (generalized) (M62.81);History of falling (Z91.81);Pain Pain -  Right/Left: Right Pain - part of body: Hip;Knee     Time: 0938-1829 PT Time Calculation (min) (ACUTE ONLY): 53 min  Charges:  $Gait Training: 8-22 mins $Therapeutic Exercise: 8-22 mins $Therapeutic Activity: 8-22 mins $Canalith Rep Proc: 8-22 mins                     Lailana Shira,PT Acute Rehabilitation Services Pager:  [phone]  Office:  [place] 10/10/2017, 1:16 PM

## 2017-10-10 NOTE — Progress Notes (Signed)
Physical Therapy Treatment Patient Details Name: Tracy Bowman MRN: [id number] DOB: [birth year] Today's Date: 10/10/2017    History of Present Illness Pt is an 84 yo female A&Ox4 admitted through ED on 10/06/17 for right distal femur fx. Pt underwent an ORIF on 10/07/17. Pt had a previous fall with ORIF to right hip s/p 6 weeks. Both incidents occured when pt bend over and had vertigo like symptoms with standing up. PMH signficant for A-fib, CHF, HLD, hypothyroid, depression with anxiety.     PT Comments    Pt seen for a second session to reinforce frequency and technique of x1 exercises. Pt had increased difficulty performing and required increased cues, demonstration, and hand-over-hand assist at times to hold card in place while performing. Pt complaining of pain in RLE in chair and her wish to take a nap. Repositioned pt in chair with pillow under RLE and behind shoulders. Encouraged pt to finish eating her lunch if she felt she could (vomiting earlier), and to request return to bed after eating so she could rest better. Will continue to follow.   Follow Up Recommendations  SNF;Supervision/Assistance - 24 hour     Equipment Recommendations  3in1 (PT);Wheelchair (measurements PT)(with elevating leg rests)    Recommendations for Other Services       Precautions / Restrictions Precautions Precautions: Fall Restrictions Weight Bearing Restrictions: Yes RLE Weight Bearing: Touchdown weight bearing    Mobility  Bed Mobility Overal bed mobility: Needs Assistance Bed Mobility: Supine to Sit Rolling: Mod assist Sidelying to sit: Mod assist Supine to sit: Mod assist;+2 for physical assistance     General bed mobility comments: Up in chair  Transfers Overall transfer level: Needs assistance Equipment used: Rolling walker (2 wheeled) Transfers: Stand Pivot Transfers;Sit to/from Stand Sit to Stand: +2 physical assistance;+2 safety/equipment;From elevated surface;Min assist;Mod  assist(EOB and BSC) Stand pivot transfers: +2 physical assistance;+2 safety/equipment;Min assist;Mod assist(cues for sequencing - cues to maintain TDWB)       General transfer comment: Not assessed this session  Ambulation/Gait             General Gait Details: Not assessed this session   Stairs             Wheelchair Mobility    Modified Rankin (Stroke Patients Only)       Balance Overall balance assessment: Needs assistance Sitting-balance support: Bilateral upper extremity supported;Single extremity supported;Feet supported Sitting balance-Leahy Scale: Fair     Standing balance support: Bilateral upper extremity supported Standing balance-Leahy Scale: Poor Standing balance comment: reliant on UE's during transfers                            Cognition Arousal/Alertness: Awake/alert Behavior During Therapy: Flat affect Overall Cognitive Status: Within Functional Limits for tasks assessed                                        Exercises Other Exercises Other Exercises: x1 exercises - horizontal and vertical head turns. Pt had difficulty with this and began moving the card instead of her head with each attempt.     General Comments General comments (skin integrity, edema, etc.): See vestibular assessment.  did treat pt with canalith repositioning for left BPPV.  After treatment assisted pt onto toilet.  Nurse brought meds in .  Pt vomited not long after this.  Was  ok at end of treatment sitting in recliner      Pertinent Vitals/Pain Pain Assessment: Faces Faces Pain Scale: Hurts little more Pain Location: right hip Pain Descriptors / Indicators: Grimacing;Guarding Pain Intervention(s): Repositioned    Home Living                      Prior Function            PT Goals (current goals can now be found in the care plan section) Acute Rehab PT Goals Patient Stated Goal: Take a nap PT Goal Formulation: With  patient/family Time For Goal Achievement: 10/15/17 Potential to Achieve Goals: Good Progress towards PT goals: Progressing toward goals    Frequency    Min 3X/week      PT Plan Current plan remains appropriate    Co-evaluation              AM-PAC PT "6 Clicks" Daily Activity  Outcome Measure  Difficulty turning over in bed (including adjusting bedclothes, sheets and blankets)?: Unable Difficulty moving from lying on back to sitting on the side of the bed? : Unable Difficulty sitting down on and standing up from a chair with arms (e.g., wheelchair, bedside commode, etc,.)?: Unable Help needed moving to and from a bed to chair (including a wheelchair)?: A Lot Help needed walking in hospital room?: A Lot Help needed climbing 3-5 steps with a railing? : Total 6 Click Score: 8    End of Session Equipment Utilized During Treatment: Gait belt Activity Tolerance: Patient tolerated treatment well Patient left: in chair;with call bell/phone within reach;with chair alarm set Nurse Communication: Mobility status PT Visit Diagnosis: Unsteadiness on feet (R26.81);Other abnormalities of gait and mobility (R26.89);Muscle weakness (generalized) (M62.81);History of falling (Z91.81);Pain Pain - Right/Left: Right Pain - part of body: Hip;Knee     Time: 4920-1007 PT Time Calculation (min) (ACUTE ONLY): 12 min  Charges: $Therapeutic Exercise: 8-22 mins                     Rolinda Roan, PT, DPT Acute Rehabilitation Services Pager: [phone] Office: [phone]    Thelma Comp 10/10/2017, 1:53 PM

## 2017-10-10 NOTE — Progress Notes (Signed)
[facility] PROGRESS NOTE  BRILEY BUMGARNER FGH:[id number] DOB: [birth year] DOA: 10/06/2017 PCP: Neale Burly, MD  Brief summary   84 y.o.femalewith past medical history significant for atrial fibrillation not on anticoagulants, dCHF with EF 60-65%,remote endometrial cancer(s/p ofsurgery and chemotherapy, no radiation therapy per patient),hyperlipidemia, hypothyroidism, depression with anxiety, s/p ofright hip fracture surgery 6 weeks ago.  Patient presented with fall and rightknee pain.Patient was transferred from [facility] due to right distal femoral fracture. Orthopedic team was consulted.Patient underwent open reduction and internal fixation of right distal femoral fracture on 10/07/2017. Awaiting rehab eval   Assessment/Plan:  Closed fracture of right distal femur (HCC):Orthopedic surgeon was consulted. Pt underwent ORIF of right distal femoral fractureon 10/07/2017. Cont per ortho. Pt/ot: CIR vs SNF at discharge. Consulted SW  Atrial Fibrillation: CHA2DS2-VASc Scoreis 4. Per hercardiologist, Dr. Lora Havens note, patient was reluctant to take blood thinner, and also because that the patient has high risk of fall,therefore patient is not on anticoagulants. Heart rate is well controlled. Cont Cardizem  Chronic diastolic (congestive) heart failure (HCC):2D echodone in [facility] 04/16/2014 showed EF60-65%. Patient has trace leg edema, but no JVD or respiratory distress. CHF seems to be compensated.  Depression and anxiety:Stable, no suicidal or homicidal ideations. continue home medications:Zoloft, Remeron, Xanax  Hypothyroidism: Continue Synthroid  Reported mild chest pain:Pt denies any chest pains to me. Troponin  is negative. Cont Aspirin, PRN nitroglycerin.   UTI. Started on ceftriaxone. Afebrile. Monitor    Code Status: full Family Communication: d/w patient (indicate person spoken with, relationship, and if by phone, the  number) Disposition Plan: pend acute rehab eval    Consultants:  Ortho  rehab  Procedures:  Hip surgery   Antibiotics: Anti-infectives (From admission, onward)   Start     Dose/Rate Route Frequency Ordered Stop   10/09/17 1315  cefTRIAXone (ROCEPHIN) 1 g in sodium chloride 0.9 % 100 mL IVPB     1 g 200 mL/hr over 30 Minutes Intravenous Every 24 hours 10/09/17 1311     10/07/17 1700  ceFAZolin (ANCEF) IVPB 1 g/50 mL premix     1 g 100 mL/hr over 30 Minutes Intravenous Every 8 hours 10/07/17 1453 10/08/17 0934   10/07/17 0900  ceFAZolin (ANCEF) IVPB 2g/100 mL premix     2 g 200 mL/hr over 30 Minutes Intravenous On call 10/07/17 0847 10/07/17 0925   10/07/17 0848  ceFAZolin (ANCEF) 2-4 GM/100ML-% IVPB    Note to Pharmacy:  Henrine Screws   : cabinet override      10/07/17 0848 10/07/17 0925       (indicate start date, and stop date if known)  HPI/Subjective: No acute issues overnight. No distress. She reports mild post op pain. No focal weakness   Objective: Vitals:   10/09/17 2113 10/10/17 0432  BP: (!) 129/58 (!) 133/57  Pulse: 86 (!) 106  Resp: 14 14  Temp: 98.6 F (37 C) 98 F (36.7 C)  SpO2: 97% 98%    Intake/Output Summary (Last 24 hours) at 10/10/2017 6789 Last data filed at 10/09/2017 2130 Gross per 24 hour  Intake 651.53 ml  Output -  Net 651.53 ml   There were no vitals filed for this visit.  Exam:   General:  No distress   Cardiovascular: s1,s2 rrr  Respiratory: CAT BL  Abdomen: soft, nt   Musculoskeletal: no leg edema    Data Reviewed: Basic Metabolic Panel: Recent Labs  Lab 10/07/17 1053 10/07/17 1505 10/08/17 3810 10/09/17 1751 10/10/17 0544  NA 137  --  131* 138 140  K 4.2  --  4.6 4.4 4.0  CL  --   --  98 103 105  CO2  --   --  25 27 28   GLUCOSE  --   --  307* 167* 136*  BUN  --   --  27* 26* 19  CREATININE  --  0.66 0.88 0.60 0.54  CALCIUM  --   --  7.9* 8.0* 7.7*   Liver Function Tests: No results for input(s): AST,  ALT, ALKPHOS, BILITOT, PROT, ALBUMIN in the last 168 hours. No results for input(s): LIPASE, AMYLASE in the last 168 hours. No results for input(s): AMMONIA in the last 168 hours. CBC: Recent Labs  Lab 10/07/17 1053 10/07/17 1407 10/07/17 1505 10/08/17 0812  WBC  --  13.8* 13.7* 14.1*  HGB 10.5* 9.6* 9.8* 8.2*  HCT 31.0* 30.1* 30.3* 26.1*  MCV  --  101.7* 101.3* 102.0*  PLT  --  152 150 150   Cardiac Enzymes: Recent Labs  Lab 10/07/17 0356  TROPONINI <0.03   BNP (last 3 results) Recent Labs    10/07/17 0051  BNP 50.2    ProBNP (last 3 results) No results for input(s): PROBNP in the last 8760 hours.  CBG: No results for input(s): GLUCAP in the last 168 hours.  Recent Results (from the past 240 hour(s))  Surgical pcr screen     Status: None   Collection Time: 10/07/17  2:04 AM  Result Value Ref Range Status   MRSA, PCR NEGATIVE NEGATIVE Final   Staphylococcus aureus NEGATIVE NEGATIVE Final    Comment: (NOTE) The Xpert SA Assay (FDA approved for NASAL specimens in patients 69 years of age and older), is one component of a comprehensive surveillance program. It is not intended to diagnose infection nor to guide or monitor treatment. Performed at [facility], [place] [address], [place], [place] [address]      Studies: No results found.  Scheduled Meds: . acetaminophen  650 mg Oral Q8H  . aspirin EC  81 mg Oral Daily  . diltiazem  120 mg Oral Daily  . docusate sodium  100 mg Oral BID  . enoxaparin (LOVENOX) injection  40 mg Subcutaneous Q24H  . feeding supplement  237 mL Oral BID BM  . levothyroxine  25 mcg Oral QAC breakfast  . mirtazapine  15 mg Oral QHS  . omega-3 acid ethyl esters  1 g Oral Daily  . sertraline  50 mg Oral Daily  . vitamin B-12  1,000 mcg Oral Daily  . white petrolatum       Continuous Infusions: . cefTRIAXone (ROCEPHIN)  IV Stopped (10/09/17 1636)    Principal Problem:   Closed fracture of right distal femur (HCC) Active  Problems:   Atrial fibrillation (HCC)   Hyperlipidemia   Chronic atrial fibrillation   Chronic diastolic (congestive) heart failure (HCC)   Depression with anxiety   Hypothyroidism   Chest pain   Fracture   Chronic diastolic congestive heart failure ([place])   History of endometrial cancer   Closed nondisplaced intertrochanteric fracture of left femur (HCC)   Acute blood loss anemia   Leukopenia   Post-operative pain    Time spent: >35 minutes     Kinnie Feil  [facility] Pager [phone]. If 7PM-7AM, please contact night-coverage at [email or web address], password [facility] 10/10/2017, 8:33 AM  LOS: 4 days

## 2017-10-10 NOTE — NC FL2 (Signed)
[place] MEDICAID FL2 LEVEL OF CARE SCREENING TOOL     IDENTIFICATION  Patient Name: Tracy Bowman Birthdate: 1927/06/18 Sex: female Admission Date (Current Location): 10/06/2017  [place] and [place] Number:  [facility])   Facility and Address:  [facility], [place] [address], [place], [place] [address]      Provider Number: [id number]  Attending Physician Name and Address:  Kinnie Feil, MD  Relative Name and Phone Number:  Jenesa Foresta (spouse) [phone]    Current Level of Care: Hospital Recommended Level of Care: [place] Prior Approval Number:    Date Approved/Denied:   PASRR Number:    Discharge Plan: SNF    Current Diagnoses: Patient Active Problem List   Diagnosis Date Noted  . Fracture   . Chronic diastolic congestive heart failure ([place])   . History of endometrial cancer   . Closed nondisplaced intertrochanteric fracture of left femur ([place])   . Acute blood loss anemia   . Leukopenia   . Post-operative pain   . Closed fracture of right distal femur ([place]) 10/07/2017  . Chest pain 10/07/2017  . Hyperlipidemia   . Chronic atrial fibrillation   . Chronic diastolic (congestive) heart failure ([place])   . Depression with anxiety   . Hypothyroidism   . Atrial fibrillation ([place]) 01/23/2014    Orientation RESPIRATION BLADDER Height & Weight     Self, Time, Situation, Place  Normal Continent, External catheter Weight:   Height:     BEHAVIORAL SYMPTOMS/MOOD NEUROLOGICAL BOWEL NUTRITION STATUS      Incontinent Diet(see discharge summary)  AMBULATORY STATUS COMMUNICATION OF NEEDS Skin   Limited Assist Verbally Surgical wounds(right leg closed incision)                       Personal Care Assistance Level of Assistance  Bathing, Feeding, Dressing, Total care Bathing Assistance: Limited assistance Feeding assistance: Independent Dressing Assistance: Limited assistance Total Care Assistance: Limited  assistance   Functional Limitations Info  Sight, Hearing, Speech Sight Info: Adequate Hearing Info: Adequate Speech Info: Adequate    SPECIAL CARE FACTORS FREQUENCY  PT (By licensed PT), OT (By licensed OT)     PT Frequency: 5x weekly OT Frequency: 5x weekly            Contractures Contractures Info: Not present    Additional Factors Info  Allergies, Code Status Code Status Info: Full Allergies Info: Allergies:  Morphine, Oxycodone           Current Medications (10/10/2017):  This is the current hospital active medication list Current Facility-Administered Medications  Medication Dose Route Frequency Provider Last Rate Last Dose  . acetaminophen (TYLENOL) tablet 325-650 mg  325-650 mg Oral Q6H PRN Ainsley Spinner, PA-C   650 mg at 10/09/17 1234  . acetaminophen (TYLENOL) tablet 650 mg  650 mg Oral Q8H Ainsley Spinner, PA-C   650 mg at 10/10/17 0557  . aspirin EC tablet 81 mg  81 mg Oral Daily Ainsley Spinner, PA-C   81 mg at 10/10/17 0948  . cefTRIAXone (ROCEPHIN) 1 g in sodium chloride 0.9 % 100 mL IVPB  1 g Intravenous Q24H Kinnie Feil, MD   Stopped at 10/09/17 1636  . diltiazem (CARDIZEM CD) 24 hr capsule 120 mg  120 mg Oral Daily Ainsley Spinner, PA-C   120 mg at 10/10/17 0948  . docusate sodium (COLACE) capsule 100 mg  100 mg Oral BID Ainsley Spinner, PA-C   100  mg at 10/09/17 0907  . enoxaparin (LOVENOX) injection 40 mg  40 mg Subcutaneous Q24H Ainsley Spinner, PA-C   40 mg at 10/10/17 0948  . feeding supplement (ENSURE SURGERY) liquid 237 mL  237 mL Oral BID BM Dana Allan I, MD   237 mL at 10/10/17 0948  . hydrALAZINE (APRESOLINE) injection 5 mg  5 mg Intravenous Q2H PRN Ainsley Spinner, PA-C      . HYDROmorphone (DILAUDID) injection 0.25-0.5 mg  0.25-0.5 mg Intravenous Q3H PRN Ainsley Spinner, PA-C      . levothyroxine (SYNTHROID, LEVOTHROID) tablet 25 mcg  25 mcg Oral QAC breakfast Ainsley Spinner, PA-C   25 mcg at 10/10/17 0948  . methocarbamol (ROBAXIN) tablet 500 mg  500 mg Oral Q8H  PRN Ainsley Spinner, PA-C   500 mg at 10/08/17 2233  . metoCLOPramide (REGLAN) tablet 5-10 mg  5-10 mg Oral Q8H PRN Ainsley Spinner, PA-C       Or  . metoCLOPramide (REGLAN) injection 5-10 mg  5-10 mg Intravenous Q8H PRN Ainsley Spinner, PA-C      . mirtazapine (REMERON) tablet 15 mg  15 mg Oral QHS Ainsley Spinner, PA-C   15 mg at 10/09/17 2239  . nitroGLYCERIN (NITROSTAT) SL tablet 0.4 mg  0.4 mg Sublingual Q5 min PRN Ainsley Spinner, PA-C      . omega-3 acid ethyl esters (LOVAZA) capsule 1 g  1 g Oral Daily Ainsley Spinner, PA-C   1 g at 10/10/17 0949  . ondansetron (ZOFRAN) tablet 4 mg  4 mg Oral Q6H PRN Ainsley Spinner, PA-C   4 mg at 10/09/17 1601   Or  . ondansetron (ZOFRAN) injection 4 mg  4 mg Intravenous Q6H PRN Ainsley Spinner, PA-C   4 mg at 10/08/17 2326  . oxyCODONE (Oxy IR/ROXICODONE) immediate release tablet 5 mg  5 mg Oral Q3H PRN Ainsley Spinner, PA-C      . polyethylene glycol (MIRALAX / GLYCOLAX) packet 17 g  17 g Oral Daily PRN Ainsley Spinner, PA-C      . sertraline (ZOLOFT) tablet 50 mg  50 mg Oral Daily Ainsley Spinner, PA-C   50 mg at 10/10/17 0948  . vitamin B-12 (CYANOCOBALAMIN) tablet 1,000 mcg  1,000 mcg Oral Daily Ainsley Spinner, PA-C   1,000 mcg at 10/10/17 3893  . white petrolatum (VASELINE) gel              Discharge Medications: Please see discharge summary for a list of discharge medications.  Relevant Imaging Results:  Relevant Lab Results:   Additional Information SSN: [id number]  Alberteen Sam, LCSW

## 2017-10-10 NOTE — Clinical Social Work Note (Signed)
Clinical Social Work Assessment  Patient Details  Name: Tracy Bowman MRN: [id number] Date of Birth: Jun 29, 1927  Date of referral:  10/10/17               Reason for consult:  Discharge Planning                Permission sought to share information with:  Case Manager, Facility Sport and exercise psychologist, Family Supports Permission granted to share information::  Yes, Verbal Permission Granted  Name::     Advertising account planner::  SNFs  Relationship::  spouse  Contact Information:  [phone]  Housing/Transportation Living arrangements for the past 2 months:  [place] of Information:  Patient Patient Interpreter Needed:  None Criminal Activity/Legal Involvement Pertinent to Current Situation/Hospitalization:  No - Comment as needed Significant Relationships:  Spouse Lives with:  Spouse Do you feel safe going back to the place where you live?  No Need for family participation in patient care:  Yes (Comment)  Care giving concerns:  CSW received referral for possible SNF placement at time of discharge. Spoke with patient regarding possibility of SNF placement . Patient's  Husband  is currently unable to care for her at their home given patient's current needs and fall risk.  Patient  expressed understanding of PT recommendation and are agreeable to SNF placement at time of discharge. CSW to continue to follow and assist with discharge planning needs.     Social Worker assessment / plan:  Spoke with patient  concerning possibility of rehab at SNF before returning home.    Employment status:  Retired Nurse, adult PT Recommendations:  [place] / Referral to community resources:  [place]  Patient/Family's Response to care:  Patient and   husband  recognize need for rehab before returning home and are agreeable to a SNF near [place] where family is located. They report preference for  [facility] or [place] explained insurance authorization process. Patient's family reported that they want patient to get stronger to be able to come back home.   Patient/Family's Understanding of and Emotional Response to Diagnosis, Current Treatment, and Prognosis:  Patient/family is realistic regarding therapy needs and expressed being hopeful for SNF placement. Patient expressed understanding of CSW role and discharge process as well as medical condition. No questions/concerns about plan or treatment.    Emotional Assessment Appearance:  Appears stated age Attitude/Demeanor/Rapport:  Gracious, Engaged Affect (typically observed):  Accepting, Adaptable Orientation:  Oriented to Self, Oriented to Place, Oriented to  Time, Oriented to Situation Alcohol / Substance use:  Not Applicable Psych involvement (Current and /or in the community):  No (Comment)  Discharge Needs  Concerns to be addressed:  Discharge Planning Concerns Readmission within the last 30 days:  No Current discharge risk:  Dependent with Mobility Barriers to Discharge:  Continued Medical Work up   [employer], LCSW 10/10/2017, 10:02 AM

## 2017-10-11 DIAGNOSIS — S72401A Unspecified fracture of lower end of right femur, initial encounter for closed fracture: Secondary | ICD-10-CM | POA: Diagnosis not present

## 2017-10-11 DIAGNOSIS — K5720 Diverticulitis of large intestine with perforation and abscess without bleeding: Secondary | ICD-10-CM | POA: Diagnosis not present

## 2017-10-11 DIAGNOSIS — D649 Anemia, unspecified: Secondary | ICD-10-CM | POA: Diagnosis not present

## 2017-10-11 DIAGNOSIS — S7291XA Unspecified fracture of right femur, initial encounter for closed fracture: Secondary | ICD-10-CM | POA: Diagnosis not present

## 2017-10-11 DIAGNOSIS — E039 Hypothyroidism, unspecified: Secondary | ICD-10-CM | POA: Diagnosis not present

## 2017-10-11 DIAGNOSIS — F329 Major depressive disorder, single episode, unspecified: Secondary | ICD-10-CM | POA: Diagnosis not present

## 2017-10-11 DIAGNOSIS — M2550 Pain in unspecified joint: Secondary | ICD-10-CM | POA: Diagnosis not present

## 2017-10-11 DIAGNOSIS — Z8544 Personal history of malignant neoplasm of other female genital organs: Secondary | ICD-10-CM | POA: Diagnosis not present

## 2017-10-11 DIAGNOSIS — F418 Other specified anxiety disorders: Secondary | ICD-10-CM | POA: Diagnosis not present

## 2017-10-11 DIAGNOSIS — R262 Difficulty in walking, not elsewhere classified: Secondary | ICD-10-CM | POA: Diagnosis not present

## 2017-10-11 DIAGNOSIS — C5701 Malignant neoplasm of right fallopian tube: Secondary | ICD-10-CM | POA: Diagnosis not present

## 2017-10-11 DIAGNOSIS — Z90710 Acquired absence of both cervix and uterus: Secondary | ICD-10-CM | POA: Diagnosis not present

## 2017-10-11 DIAGNOSIS — D62 Acute posthemorrhagic anemia: Secondary | ICD-10-CM | POA: Diagnosis not present

## 2017-10-11 DIAGNOSIS — M6281 Muscle weakness (generalized): Secondary | ICD-10-CM | POA: Diagnosis not present

## 2017-10-11 DIAGNOSIS — R42 Dizziness and giddiness: Secondary | ICD-10-CM | POA: Diagnosis not present

## 2017-10-11 DIAGNOSIS — S72401D Unspecified fracture of lower end of right femur, subsequent encounter for closed fracture with routine healing: Secondary | ICD-10-CM | POA: Diagnosis not present

## 2017-10-11 DIAGNOSIS — M1990 Unspecified osteoarthritis, unspecified site: Secondary | ICD-10-CM | POA: Diagnosis not present

## 2017-10-11 DIAGNOSIS — N390 Urinary tract infection, site not specified: Secondary | ICD-10-CM | POA: Diagnosis not present

## 2017-10-11 DIAGNOSIS — I209 Angina pectoris, unspecified: Secondary | ICD-10-CM | POA: Diagnosis not present

## 2017-10-11 DIAGNOSIS — S72141D Displaced intertrochanteric fracture of right femur, subsequent encounter for closed fracture with routine healing: Secondary | ICD-10-CM | POA: Diagnosis not present

## 2017-10-11 DIAGNOSIS — J069 Acute upper respiratory infection, unspecified: Secondary | ICD-10-CM | POA: Diagnosis not present

## 2017-10-11 DIAGNOSIS — K5900 Constipation, unspecified: Secondary | ICD-10-CM | POA: Diagnosis not present

## 2017-10-11 DIAGNOSIS — Z9181 History of falling: Secondary | ICD-10-CM | POA: Diagnosis not present

## 2017-10-11 DIAGNOSIS — F419 Anxiety disorder, unspecified: Secondary | ICD-10-CM | POA: Diagnosis not present

## 2017-10-11 DIAGNOSIS — Z23 Encounter for immunization: Secondary | ICD-10-CM | POA: Diagnosis not present

## 2017-10-11 DIAGNOSIS — Z4789 Encounter for other orthopedic aftercare: Secondary | ICD-10-CM | POA: Diagnosis not present

## 2017-10-11 DIAGNOSIS — I10 Essential (primary) hypertension: Secondary | ICD-10-CM | POA: Diagnosis not present

## 2017-10-11 DIAGNOSIS — R609 Edema, unspecified: Secondary | ICD-10-CM | POA: Diagnosis not present

## 2017-10-11 DIAGNOSIS — E785 Hyperlipidemia, unspecified: Secondary | ICD-10-CM | POA: Diagnosis not present

## 2017-10-11 DIAGNOSIS — M84451S Pathological fracture, right femur, sequela: Secondary | ICD-10-CM | POA: Diagnosis not present

## 2017-10-11 DIAGNOSIS — Z08 Encounter for follow-up examination after completed treatment for malignant neoplasm: Secondary | ICD-10-CM | POA: Diagnosis not present

## 2017-10-11 DIAGNOSIS — R531 Weakness: Secondary | ICD-10-CM | POA: Diagnosis not present

## 2017-10-11 DIAGNOSIS — Z7401 Bed confinement status: Secondary | ICD-10-CM | POA: Diagnosis not present

## 2017-10-11 DIAGNOSIS — N738 Other specified female pelvic inflammatory diseases: Secondary | ICD-10-CM | POA: Diagnosis not present

## 2017-10-11 DIAGNOSIS — D72819 Decreased white blood cell count, unspecified: Secondary | ICD-10-CM | POA: Diagnosis not present

## 2017-10-11 DIAGNOSIS — C763 Malignant neoplasm of pelvis: Secondary | ICD-10-CM | POA: Diagnosis not present

## 2017-10-11 DIAGNOSIS — I4891 Unspecified atrial fibrillation: Secondary | ICD-10-CM | POA: Diagnosis not present

## 2017-10-11 DIAGNOSIS — F411 Generalized anxiety disorder: Secondary | ICD-10-CM | POA: Diagnosis not present

## 2017-10-11 DIAGNOSIS — R5381 Other malaise: Secondary | ICD-10-CM | POA: Diagnosis not present

## 2017-10-11 DIAGNOSIS — I4821 Permanent atrial fibrillation: Secondary | ICD-10-CM | POA: Diagnosis not present

## 2017-10-11 DIAGNOSIS — I5032 Chronic diastolic (congestive) heart failure: Secondary | ICD-10-CM | POA: Diagnosis not present

## 2017-10-11 LAB — CBC
HCT: 23.5 % — ABNORMAL LOW (ref 36.0–46.0)
Hemoglobin: 7.2 g/dL — ABNORMAL LOW (ref 12.0–15.0)
MCH: 32.1 pg (ref 26.0–34.0)
MCHC: 30.6 g/dL (ref 30.0–36.0)
MCV: 104.9 fL — AB (ref 80.0–100.0)
PLATELETS: 226 10*3/uL (ref 150–400)
RBC: 2.24 MIL/uL — ABNORMAL LOW (ref 3.87–5.11)
RDW: 14.4 % (ref 11.5–15.5)
WBC: 9 10*3/uL (ref 4.0–10.5)
nRBC: 1.1 % — ABNORMAL HIGH (ref 0.0–0.2)

## 2017-10-11 MED ORDER — CEPHALEXIN 500 MG PO CAPS
500.0000 mg | ORAL_CAPSULE | Freq: Two times a day (BID) | ORAL | Status: DC
  Administered 2017-10-11: 500 mg via ORAL
  Filled 2017-10-11: qty 1

## 2017-10-11 MED ORDER — OXYCODONE HCL 5 MG PO TABS: 5.0000 mg | ORAL_TABLET | ORAL | 0 refills | Status: DC | PRN

## 2017-10-11 MED ORDER — FERROUS SULFATE 325 (65 FE) MG PO TABS: 325.0000 mg | ORAL_TABLET | Freq: Every day | ORAL | 3 refills | Status: DC

## 2017-10-11 MED ORDER — DOCUSATE SODIUM 100 MG PO CAPS: 100.0000 mg | ORAL_CAPSULE | Freq: Two times a day (BID) | ORAL | 0 refills | Status: DC

## 2017-10-11 MED ORDER — ACETAMINOPHEN 325 MG PO TABS: 650.0000 mg | ORAL_TABLET | Freq: Four times a day (QID) | ORAL | Status: AC | PRN

## 2017-10-11 MED ORDER — ASPIRIN 81 MG PO TBEC: 81.0000 mg | DELAYED_RELEASE_TABLET | Freq: Every day | ORAL | Status: AC

## 2017-10-11 MED ORDER — ALPRAZOLAM 0.5 MG PO TABS: 0.5000 mg | ORAL_TABLET | Freq: Every evening | ORAL | 0 refills | Status: AC | PRN

## 2017-10-11 MED ORDER — CEPHALEXIN 500 MG PO CAPS: 500.0000 mg | ORAL_CAPSULE | Freq: Two times a day (BID) | ORAL | 0 refills | Status: DC

## 2017-10-11 MED ORDER — ENOXAPARIN SODIUM 40 MG/0.4ML ~~LOC~~ SOLN: 40.0000 mg | SUBCUTANEOUS | 0 refills | Status: DC

## 2017-10-11 MED ORDER — ENSURE SURGERY PO LIQD: 237.0000 mL | Freq: Two times a day (BID) | ORAL | Status: DC

## 2017-10-11 NOTE — Clinical Social Work Placement (Signed)
   CLINICAL SOCIAL WORK PLACEMENT  NOTE  Date:  10/11/2017  Patient Details  Name: Tracy Bowman MRN: [id number] Date of Birth: [birth year]  Clinical Social Work is seeking post-discharge placement for this patient at the [place] level of care (*CSW will initial, date and re-position this form in  chart as items are completed):      Patient/family provided with [place] Work Department's list of facilities offering this level of care within the geographic area requested by the patient (or if unable, by the patient's family).  Yes   Patient/family informed of their freedom to choose among providers that offer the needed level of care, that participate in Medicare, Medicaid or managed care program needed by the patient, have an available bed and are willing to accept the patient.      Patient/family informed of [facility]'s ownership interest in [facility] and [facility], as well as of the fact that they are under no obligation to receive care at these facilities.  PASRR submitted to EDS on       PASRR number received on ([place] facility no PASRR)     Existing PASRR number confirmed on       FL2 transmitted to all facilities in geographic area requested by pt/family on 10/09/17     FL2 transmitted to all facilities within larger geographic area on       Patient informed that his/her managed care company has contracts with or will negotiate with certain facilities, including the following:        Yes   Patient/family informed of bed offers received.  Patient chooses bed at Other - please specify in the comment section below:([place])     Physician recommends and patient chooses bed at      Patient to be transferred to Other - please specify in the comment section below:([place]) on 10/11/17.  Patient to be transferred to facility by [facility]     Patient family notified on 10/11/17 of transfer.  Name of family member notified:   Francee Piccolo (husband)     PHYSICIAN Please prepare priority discharge summary, including medications     Additional Comment:    _______________________________________________ Alberteen Sam, LCSW 10/11/2017, 10:27 AM

## 2017-10-11 NOTE — Discharge Summary (Signed)
Physician Discharge Summary  Tracy Bowman VQQ:[id number] DOB: 09-Apr-1927 DOA: 10/06/2017  PCP: Neale Burly, MD  Admit date: 10/06/2017 Discharge date: 10/11/2017  Time spent: >35 minutes  Recommendations for Outpatient Follow-up:  SNF MD at the facility. Please repeat Hg in 3-5 days F/u with orthopedics in 2 weeks  Discharge Diagnoses:  Principal Problem:   Closed fracture of right distal femur ([place]) Active Problems:   Atrial fibrillation (HCC)   Hyperlipidemia   Chronic atrial fibrillation   Chronic diastolic (congestive) heart failure (HCC)   Depression with anxiety   Hypothyroidism   Chest pain   Fracture   Chronic diastolic congestive heart failure (HCC)   History of endometrial cancer   Closed nondisplaced intertrochanteric fracture of left femur (HCC)   Acute blood loss anemia   Leukopenia   Post-operative pain   Discharge Condition: stable   Diet recommendation: low sodium   There were no vitals filed for this visit.  History of present illness:   84 y.o.femalewithpast medical history significant foratrial fibrillation not on anticoagulants, dCHF with EF 60-65%,remote endometrial cancer(s/p ofsurgery and chemotherapy, no radiation therapy per patient),hyperlipidemia, hypothyroidism, depression with anxiety, s/p ofright hip fracture surgery 6 weeks ago. Patient presented withfall and rightknee pain.Patient was [facility] from [facility] due to right distal femoral fracture. Orthopedic team was consulted.Patientunderwent open reduction and internal fixation of right distal femoral fracture on 10/07/2017. Awaiting SNF  Hospital Course:   Closed fracture of right distal femur (HCC):Orthopedic surgeon was consulted. Pt underwent ORIF of right distal femoral fracture on 10/07/2017. D/w ortho who recommended lovenox for 21 days for DVTY prophylaxis. F/u with ortho in 2 weeks. Pt/ot: plan for SNF at discharge.   Atrial Fibrillation:  CHA2DS2-VASc Scoreis 4. Per hercardiologist, Dr. Lora Havens note, patient was reluctant to take blood thinner, and also because that the patient has high risk of fall,therefore patient is not on anticoagulants. Heart rate is well controlled. Cont Cardizem  Chronic diastolic (congestive) heart failure (HCC):2D echodone in [facility] 04/16/2014 showed EF60-65%. Patient has trace leg edema, but no JVD or respiratory distress. CHF seems to be compensated.  Depression and anxiety:Stable, no suicidal or homicidal ideations. continue home medications:Zoloft, Remeron, Xanax  Hypothyroidism: Continue Synthroid  UTI. Received ceftriaxone. Afebrile. Keflex to complete. Monitor   Anemia. Post op acute blood loss. No s/s of obvious further bleeding. Started iron supplementation. repeat Hg in 3-5 days   Procedures:  Right hip ORIF (i.e. Studies not automatically included, echos, thoracentesis, etc; not x-rays)  Consultations:  Orthopedics   Discharge Exam: Vitals:   10/10/17 2014 10/11/17 0427  BP: 122/67 (!) 112/55  Pulse: 74 68  Resp: 16 19  Temp: 98.6 F (37 C) 98.7 F (37.1 C)  SpO2: 97% 90%    General: no distress  Cardiovascular: s1,s2 rrr Respiratory: CTA BL  Discharge Instructions  Discharge Instructions    Diet - low sodium heart healthy   Complete by:  As directed    Increase activity slowly   Complete by:  As directed      Allergies as of 10/11/2017      Reactions   Morphine Other (See Comments)   hallucinations    Oxycodone Other (See Comments)   Pt does not tolerate well extreme dry mouth       Medication List    STOP taking these medications   celecoxib 200 MG capsule Commonly known as:  CELEBREX     TAKE these medications   acetaminophen 325 MG tablet Commonly known  as:  TYLENOL Take 2 tablets (650 mg total) by mouth every 6 (six) hours as needed.   alendronate 70 MG tablet Commonly known as:  FOSAMAX Take 70 mg by mouth once a  week.   ALPRAZolam 0.5 MG tablet Commonly known as:  XANAX Take 1 tablet (0.5 mg total) by mouth at bedtime as needed.   aspirin 81 MG EC tablet Take 1 tablet (81 mg total) by mouth daily.   cephALEXin 500 MG capsule Commonly known as:  KEFLEX Take 1 capsule (500 mg total) by mouth every 12 (twelve) hours.   [employer] ONE DAILY ESSENTIAL Tabs Take 1 tablet by mouth daily.   [employer] VITAMIN B12 1000 MCG tablet Generic drug:  cyanocobalamin Take 1,000 mcg by mouth daily.   diltiazem 120 MG 24 hr capsule Commonly known as:  TIAZAC Take 120 mg by mouth daily.   docusate sodium 100 MG capsule Commonly known as:  COLACE Take 1 capsule (100 mg total) by mouth 2 (two) times daily.   enoxaparin 40 MG/0.4ML injection Commonly known as:  LOVENOX Inject 0.4 mLs (40 mg total) into the skin daily.   feeding supplement Liqd Take 237 mLs by mouth 2 (two) times daily between meals.   ferrous sulfate 325 (65 FE) MG tablet Take 1 tablet (325 mg total) by mouth daily.   Fish Oil 1000 MG Caps Take 1,000 mg by mouth 2 (two) times daily.   furosemide 20 MG tablet Commonly known as:  LASIX Take 20 mg by mouth daily.   levothyroxine 25 MCG tablet Commonly known as:  SYNTHROID, LEVOTHROID Take 25 mcg by mouth daily before breakfast.   Magnesium 200 MG Tabs Take 1 tablet by mouth daily.   meclizine 12.5 MG tablet Commonly known as:  ANTIVERT Take 12.5 mg by mouth 3 (three) times daily as needed for dizziness.   midodrine 2.5 MG tablet Commonly known as:  PROAMATINE Take 2.5 mg by mouth 2 (two) times daily.   mirtazapine 15 MG tablet Commonly known as:  REMERON Take by mouth.   oxyCODONE 5 MG immediate release tablet Commonly known as:  Oxy IR/ROXICODONE Take 1 tablet (5 mg total) by mouth every 3 (three) hours as needed for moderate pain or severe pain.   sertraline 50 MG tablet Commonly known as:  ZOLOFT Take 50 mg by mouth daily.      Allergies  Allergen Reactions  . Morphine  Other (See Comments)    hallucinations   . Oxycodone Other (See Comments)    Pt does not tolerate well extreme dry mouth        The results of significant diagnostics from this hospitalization (including imaging, microbiology, ancillary and laboratory) are listed below for reference.    Significant Diagnostic Studies: Ct Head Wo Contrast  Result Date: 10/07/2017 CLINICAL DATA:  84 y/o  F; head trauma, ataxia. EXAM: CT HEAD WITHOUT CONTRAST TECHNIQUE: Contiguous axial images were obtained from the base of the skull through the vertex without intravenous contrast. COMPARISON:  08/28/2017 CT of the head. FINDINGS: Brain: No evidence of acute infarction, hemorrhage, hydrocephalus, extra-axial collection or mass lesion/mass effect. Stable chronic microvascular ischemic changes and volume loss of the brain. Vascular: Calcific atherosclerosis of carotid siphons. No hyperdense vessel identified. Skull: Normal. Negative for fracture or focal lesion. Sinuses/Orbits: No acute finding. Other: Bilateral intra-ocular lens replacement. IMPRESSION: 1. No acute intracranial abnormality. 2. Stable chronic microvascular ischemic changes and volume loss of the brain. Electronically Signed   By: Edgardo Roys.D.  On: 10/07/2017 05:18   Dg Chest Port 1 View  Result Date: 10/07/2017 CLINICAL DATA:  Low O2 sats, preop EXAM: PORTABLE CHEST 1 VIEW COMPARISON:  08/28/2017 FINDINGS: Cardiomegaly. Lungs clear. No effusions or edema. No acute bony abnormality. IMPRESSION: Cardiomegaly.  No active disease. Electronically Signed   By: Rolm Baptise M.D.   On: 10/07/2017 08:24   Dg C-arm 1-60 Min  Result Date: 10/07/2017 CLINICAL DATA:  ORIF distal femoral fracture EXAM: RIGHT FEMUR 2 VIEWS; DG C-ARM 61-120 MIN COMPARISON:  10/06/2017 FINDINGS: Plate and screw fixation noted across the distal right femoral fracture. Cerclage wire also placed. Final image demonstrates continued mild displacement of the distal  fragment posteriorly on the lateral view. IMPRESSION: Internal fixation of the distal femoral fracture as above. Electronically Signed   By: Rolm Baptise M.D.   On: 10/07/2017 12:12   Dg C-arm 1-60 Min  Result Date: 10/07/2017 CLINICAL DATA:  ORIF distal femoral fracture EXAM: RIGHT FEMUR 2 VIEWS; DG C-ARM 61-120 MIN COMPARISON:  10/06/2017 FINDINGS: Plate and screw fixation noted across the distal right femoral fracture. Cerclage wire also placed. Final image demonstrates continued mild displacement of the distal fragment posteriorly on the lateral view. IMPRESSION: Internal fixation of the distal femoral fracture as above. Electronically Signed   By: Rolm Baptise M.D.   On: 10/07/2017 12:12   Dg Femur, Min 2 Views Right  Result Date: 10/07/2017 CLINICAL DATA:  ORIF distal femoral fracture EXAM: RIGHT FEMUR 2 VIEWS; DG C-ARM 61-120 MIN COMPARISON:  10/06/2017 FINDINGS: Plate and screw fixation noted across the distal right femoral fracture. Cerclage wire also placed. Final image demonstrates continued mild displacement of the distal fragment posteriorly on the lateral view. IMPRESSION: Internal fixation of the distal femoral fracture as above. Electronically Signed   By: Rolm Baptise M.D.   On: 10/07/2017 12:12   Dg Femur Port, Min 2 Views Right  Result Date: 10/07/2017 CLINICAL DATA:  84 year old female status post ORIF right hip/femur EXAM: RIGHT FEMUR PORTABLE 2 VIEW COMPARISON:  10/06/2017 FINDINGS: Interval surgical changes of open reduction internal fixation of a distal femur fracture, with buttress plate and screw fixation with cerclage wires, and reduction of the deformity, with anatomic alignment maintained currently. Redemonstration of surgical changes of antegrade intramedullary rod and gamma nail of the right hip. Soft tissue swelling with gas in the surgical bed. Osteopenia. IMPRESSION: Early postoperative changes of open reduction internal fixation of the distal femur fracture with  buttress plate and screw and cerclage wire fixation with improved alignment. Redemonstration of prior hip fixation, as above. Electronically Signed   By: Corrie Mckusick D.O.   On: 10/07/2017 13:38    Microbiology: Recent Results (from the past 240 hour(s))  Surgical pcr screen     Status: None   Collection Time: 10/07/17  2:04 AM  Result Value Ref Range Status   MRSA, PCR NEGATIVE NEGATIVE Final   Staphylococcus aureus NEGATIVE NEGATIVE Final    Comment: (NOTE) The Xpert SA Assay (FDA approved for NASAL specimens in patients 99 years of age and older), is one component of a comprehensive surveillance program. It is not intended to diagnose infection nor to guide or monitor treatment. Performed at [facility], [place] [address] [place],  [address]      Labs: Basic Metabolic Panel: Recent Labs  Lab 10/07/17 1053 10/07/17 1505 10/08/17 0812 10/09/17 0628 10/10/17 0544  NA 137  --  131* 138 140  K 4.2  --  4.6 4.4 4.0  CL  --   --  98 103 105  CO2  --   --  25 27 28   GLUCOSE  --   --  307* 167* 136*  BUN  --   --  27* 26* 19  CREATININE  --  0.66 0.88 0.60 0.54  CALCIUM  --   --  7.9* 8.0* 7.7*   Liver Function Tests: No results for input(s): AST, ALT, ALKPHOS, BILITOT, PROT, ALBUMIN in the last 168 hours. No results for input(s): LIPASE, AMYLASE in the last 168 hours. No results for input(s): AMMONIA in the last 168 hours. CBC: Recent Labs  Lab 10/07/17 1053 10/07/17 1407 10/07/17 1505 10/08/17 0812 10/11/17 1052  WBC  --  13.8* 13.7* 14.1* 9.0  HGB 10.5* 9.6* 9.8* 8.2* 7.2*  HCT 31.0* 30.1* 30.3* 26.1* 23.5*  MCV  --  101.7* 101.3* 102.0* 104.9*  PLT  --  152 150 150 226   Cardiac Enzymes: Recent Labs  Lab 10/07/17 0356  TROPONINI <0.03   BNP: BNP (last 3 results) Recent Labs    10/07/17 0051  BNP 50.2    ProBNP (last 3 results) No results for input(s): PROBNP in the last 8760 hours.  CBG: No results for input(s): GLUCAP in the last  168 hours.     SignedKinnie Feil  [facility] 10/11/2017, 11:32 AM

## 2017-10-11 NOTE — Plan of Care (Signed)
  Problem: Pain Managment: Goal: General experience of comfort will improve Outcome: Progressing   Problem: Activity: Goal: Risk for activity intolerance will decrease Outcome: Progressing   Problem: Safety: Goal: Ability to remain free from injury will improve Outcome: Progressing   Problem: Education: Goal: Knowledge of General Education information will improve Description Including pain rating scale, medication(s)/side effects and non-pharmacologic comfort measures Outcome: Progressing   Problem: Skin Integrity: Goal: Risk for impaired skin integrity will decrease Outcome: Progressing

## 2017-10-11 NOTE — Progress Notes (Signed)
[facility] PROGRESS NOTE  Tracy Bowman YTW:[id number] DOB: 1927-08-11 DOA: 10/06/2017 PCP: Neale Burly, MD  Brief summary   84 y.o.femalewith past medical history significant for atrial fibrillation not on anticoagulants, dCHF with EF 60-65%,remote endometrial cancer(s/p ofsurgery and chemotherapy, no radiation therapy per patient),hyperlipidemia, hypothyroidism, depression with anxiety, s/p ofright hip fracture surgery 6 weeks ago.  Patient presented with fall and rightknee pain.Patient was transferred from [facility] due to right distal femoral fracture. Orthopedic team was consulted.Patient underwent open reduction and internal fixation of right distal femoral fracture on 10/07/2017. Awaiting SNF  Assessment/Plan:  Closed fracture of right distal femur (HCC):Orthopedic surgeon was consulted. Pt underwent ORIF of right distal femoral fracture on 10/07/2017. Cont per ortho. Pt/ot: plan for SNF at discharge. D/w SW  Atrial Fibrillation: CHA2DS2-VASc Scoreis 4. Per hercardiologist, Dr. Lora Havens note, patient was reluctant to take blood thinner, and also because that the patient has high risk of fall,therefore patient is not on anticoagulants. Heart rate is well controlled. Cont Cardizem  Chronic diastolic (congestive) heart failure (HCC):2D echodone in [facility] 04/16/2014 showed EF60-65%. Patient has trace leg edema, but no JVD or respiratory distress. CHF seems to be compensated.  Depression and anxiety:Stable, no suicidal or homicidal ideations. continue home medications:Zoloft, Remeron, Xanax  Hypothyroidism: Continue Synthroid  Reported mild chest pain:Pt denies any chest pains to me. Troponin  is negative. Cont Aspirin, PRN nitroglycerin.   UTI. Received ceftriaxone. Afebrile. Keflex to complete. Monitor    Code Status: full Family Communication: d/w patient (indicate person spoken with, relationship, and if by phone, the  number) Disposition Plan: awaiting SNF. D/w SW    Consultants:  Ortho  rehab  Procedures:  Hip surgery   Antibiotics: Anti-infectives (From admission, onward)   Start     Dose/Rate Route Frequency Ordered Stop   10/09/17 1315  cefTRIAXone (ROCEPHIN) 1 g in sodium chloride 0.9 % 100 mL IVPB     1 g 200 mL/hr over 30 Minutes Intravenous Every 24 hours 10/09/17 1311     10/07/17 1700  ceFAZolin (ANCEF) IVPB 1 g/50 mL premix     1 g 100 mL/hr over 30 Minutes Intravenous Every 8 hours 10/07/17 1453 10/08/17 0934   10/07/17 0900  ceFAZolin (ANCEF) IVPB 2g/100 mL premix     2 g 200 mL/hr over 30 Minutes Intravenous On call 10/07/17 0847 10/07/17 0925   10/07/17 0848  ceFAZolin (ANCEF) 2-4 GM/100ML-% IVPB    Note to Pharmacy:  Henrine Screws   : cabinet override      10/07/17 0848 10/07/17 0925       (indicate start date, and stop date if known)  HPI/Subjective: No acute issues overnight. No distress. She reports mild post op pain. No focal weakness   Objective: Vitals:   10/10/17 2014 10/11/17 0427  BP: 122/67 (!) 112/55  Pulse: 74 68  Resp: 16 19  Temp: 98.6 F (37 C) 98.7 F (37.1 C)  SpO2: 97% 90%    Intake/Output Summary (Last 24 hours) at 10/11/2017 0933 Last data filed at 10/11/2017 0500 Gross per 24 hour  Intake -  Output 200 ml  Net -200 ml   There were no vitals filed for this visit.  Exam:   General:  No distress   Cardiovascular: s1,s2 rrr  Respiratory: CAT BL  Abdomen: soft, nt   Musculoskeletal: no leg edema    Data Reviewed: Basic Metabolic Panel: Recent Labs  Lab 10/07/17 1053 10/07/17 1505 10/08/17 7711 10/09/17 6579 10/10/17 0544  NA 137  --  131* 138 140  K 4.2  --  4.6 4.4 4.0  CL  --   --  98 103 105  CO2  --   --  25 27 28   GLUCOSE  --   --  307* 167* 136*  BUN  --   --  27* 26* 19  CREATININE  --  0.66 0.88 0.60 0.54  CALCIUM  --   --  7.9* 8.0* 7.7*   Liver Function Tests: No results for input(s): AST, ALT, ALKPHOS,  BILITOT, PROT, ALBUMIN in the last 168 hours. No results for input(s): LIPASE, AMYLASE in the last 168 hours. No results for input(s): AMMONIA in the last 168 hours. CBC: Recent Labs  Lab 10/07/17 1053 10/07/17 1407 10/07/17 1505 10/08/17 0812  WBC  --  13.8* 13.7* 14.1*  HGB 10.5* 9.6* 9.8* 8.2*  HCT 31.0* 30.1* 30.3* 26.1*  MCV  --  101.7* 101.3* 102.0*  PLT  --  152 150 150   Cardiac Enzymes: Recent Labs  Lab 10/07/17 0356  TROPONINI <0.03   BNP (last 3 results) Recent Labs    10/07/17 0051  BNP 50.2    ProBNP (last 3 results) No results for input(s): PROBNP in the last 8760 hours.  CBG: No results for input(s): GLUCAP in the last 168 hours.  Recent Results (from the past 240 hour(s))  Surgical pcr screen     Status: None   Collection Time: 10/07/17  2:04 AM  Result Value Ref Range Status   MRSA, PCR NEGATIVE NEGATIVE Final   Staphylococcus aureus NEGATIVE NEGATIVE Final    Comment: (NOTE) The Xpert SA Assay (FDA approved for NASAL specimens in patients 69 years of age and older), is one component of a comprehensive surveillance program. It is not intended to diagnose infection nor to guide or monitor treatment. Performed at [facility], [place] [address], [place], [place] [address]      Studies: No results found.  Scheduled Meds: . acetaminophen  650 mg Oral Q8H  . aspirin EC  81 mg Oral Daily  . diltiazem  120 mg Oral Daily  . docusate sodium  100 mg Oral BID  . enoxaparin (LOVENOX) injection  40 mg Subcutaneous Q24H  . feeding supplement  237 mL Oral BID BM  . levothyroxine  25 mcg Oral QAC breakfast  . mirtazapine  15 mg Oral QHS  . omega-3 acid ethyl esters  1 g Oral Daily  . sertraline  50 mg Oral Daily  . vitamin B-12  1,000 mcg Oral Daily   Continuous Infusions: . cefTRIAXone (ROCEPHIN)  IV 1 g (10/10/17 1251)    Principal Problem:   Closed fracture of right distal femur (HCC) Active Problems:   Atrial fibrillation (HCC)    Hyperlipidemia   Chronic atrial fibrillation   Chronic diastolic (congestive) heart failure (HCC)   Depression with anxiety   Hypothyroidism   Chest pain   Fracture   Chronic diastolic congestive heart failure ([place])   History of endometrial cancer   Closed nondisplaced intertrochanteric fracture of left femur (HCC)   Acute blood loss anemia   Leukopenia   Post-operative pain    Time spent: >35 minutes     Kinnie Feil  [facility] Pager [phone]. If 7PM-7AM, please contact night-coverage at [email or web address], password [facility] 10/11/2017, 9:33 AM  LOS: 5 days

## 2017-10-11 NOTE — Progress Notes (Signed)
Patient will DC to: [facility] Anticipated DC date: 10/11/17 Family notified: husband Software engineer by: Corey Harold  Per MD patient ready for DC to [facility] . RN, patient, patient's family, and facility notified of DC. Discharge Summary sent to facility. RN given number for report [phone] call report speak with [facility], Room 203A. DC packet on chart. Ambulance transport requested for patient.  CSW signing off.  [place], [place]

## 2017-10-11 NOTE — Progress Notes (Signed)
Patient ID: Tracy Bowman, female   DOB: [birth year], 84 y.o.   MRN: [id number]   LOS: 5 days   Subjective: Pt doing well, pain controlled. Transferring well.   Objective: Vital signs in last 24 hours: Temp:  [97.9 F (36.6 C)-98.7 F (37.1 C)] 98.7 F (37.1 C) (10/09 0427) Pulse Rate:  [68-75] 68 (10/09 0427) Resp:  [16-19] 19 (10/09 0427) BP: (112-123)/(51-67) 112/55 (10/09 0427) SpO2:  [90 %-99 %] 90 % (10/09 0427) Last BM Date: 10/09/17   Laboratory  CBC Recent Labs    10/11/17 1052  WBC 9.0  HGB 7.2*  HCT 23.5*  PLT 226   BMET Recent Labs    10/09/17 0628 10/10/17 0544  NA 138 140  K 4.4 4.0  CL 103 105  CO2 27 28  GLUCOSE 167* 136*  BUN 26* 19  CREATININE 0.60 0.54  CALCIUM 8.0* 7.7*     Physical Exam General appearance: alert and no distress  RLE: LLE Incisions C/D/I, no ecchymosis or rash  Mild TTP  No knee or ankle effusion  Knee stable to varus/ valgus and anterior/posterior stress  Sens DPN, SPN, TN intact  Motor EHL, ext, flex, evers 5/5  DP 2+, PT 1+, No significant edema    Assessment/Plan: Right femur fx s/p ORIF -- Ok to go to SNF today. Continue TDWB RLE. F/u with Dr. Marcelino Scot in 2 weeks.    Lisette Abu, PA-C Orthopedic Surgery [phone] 10/11/2017

## 2017-10-11 NOTE — Progress Notes (Signed)
Pts belongings packed up, IV removed, pt given tylenol for headache. New mepilex dressing applied. Report given to nurse at [facility].

## 2017-10-11 NOTE — Progress Notes (Signed)
Physical Therapy Treatment Patient Details Name: Tracy Bowman MRN: [id number] DOB: 01/04/1927 Today's Date: 10/11/2017    History of Present Illness Pt is an 84 yo female A&Ox4 admitted through ED on 10/06/17 for right distal femur fx. Pt underwent an ORIF on 10/07/17. Pt had a previous fall with ORIF to right hip s/p 6 weeks. Both incidents occured when pt bend over and had vertigo like symptoms with standing up. PMH signficant for A-fib, CHF, HLD, hypothyroid, depression with anxiety.     PT Comments    Pt admitted with above diagnosis. Pt currently with functional limitations due to balance and endurance deficits. Pt was in chair on arrival and did not want to get into bed for further vestibular assessment.  Therefore, reviewed x1 exercises with pt and pt still needs cues and will need assist but is improving with ability to perform correctly with help.  Will continue acute PT.   Pt will benefit from skilled PT to increase their independence and safety with mobility to allow discharge to the venue listed below.     Follow Up Recommendations  SNF;Supervision/Assistance - 24 hour     Equipment Recommendations  3in1 (PT);Wheelchair (measurements PT)(with elevating leg rests)    Recommendations for Other Services       Precautions / Restrictions Precautions Precautions: Fall Restrictions Weight Bearing Restrictions: Yes RLE Weight Bearing: Touchdown weight bearing    Mobility  Bed Mobility               General bed mobility comments: Up in chair  Transfers                 General transfer comment: Not assessed this session  Ambulation/Gait             General Gait Details: Not assessed this session   Stairs             Wheelchair Mobility    Modified Rankin (Stroke Patients Only)       Balance                                            Cognition Arousal/Alertness: Awake/alert Behavior During Therapy: Flat  affect Overall Cognitive Status: Within Functional Limits for tasks assessed                                        Exercises Other Exercises Other Exercises: x1 exercises - horizontal and vertical head turns. Pt had difficulty with this needing cues and assist.  Pt could hold the card today but had trouble increasing speed of movement.     General Comments General comments (skin integrity, edema, etc.): Pt was in chair on arrival.  Pt states she is leaving today and did not want to get back in bed for further assessment.  did however review x1 exercises with pt giving cues and assist to complete exercises.       Pertinent Vitals/Pain Pain Assessment: Faces Faces Pain Scale: Hurts little more Pain Location: right hip Pain Descriptors / Indicators: Grimacing;Guarding Pain Intervention(s): Limited activity within patient's tolerance;Monitored during session    Home Living                      Prior Function  PT Goals (current goals can now be found in the care plan section) Progress towards PT goals: Progressing toward goals    Frequency    Min 3X/week      PT Plan Current plan remains appropriate    Co-evaluation              AM-PAC PT "6 Clicks" Daily Activity  Outcome Measure  Difficulty turning over in bed (including adjusting bedclothes, sheets and blankets)?: Unable Difficulty moving from lying on back to sitting on the side of the bed? : Unable Difficulty sitting down on and standing up from a chair with arms (e.g., wheelchair, bedside commode, etc,.)?: Unable Help needed moving to and from a bed to chair (including a wheelchair)?: A Lot Help needed walking in hospital room?: A Lot Help needed climbing 3-5 steps with a railing? : Total 6 Click Score: 8    End of Session   Activity Tolerance: Patient tolerated treatment well Patient left: in chair;with call bell/phone within reach;with chair alarm set Nurse  Communication: Mobility status PT Visit Diagnosis: Unsteadiness on feet (R26.81);Other abnormalities of gait and mobility (R26.89);Muscle weakness (generalized) (M62.81);History of falling (Z91.81);Pain Pain - Right/Left: Right Pain - part of body: Hip;Knee     Time: 3419-3790 PT Time Calculation (min) (ACUTE ONLY): 20 min  Charges:  $Therapeutic Exercise: 8-22 mins                     Tracy Bowman,PT Acute Rehabilitation Services Pager:  [phone]  Office:  [phone]     Tracy Bowman 10/11/2017, 2:33 PM

## 2017-10-12 DIAGNOSIS — F411 Generalized anxiety disorder: Secondary | ICD-10-CM | POA: Diagnosis not present

## 2017-10-12 DIAGNOSIS — E039 Hypothyroidism, unspecified: Secondary | ICD-10-CM | POA: Diagnosis not present

## 2017-10-12 DIAGNOSIS — I4891 Unspecified atrial fibrillation: Secondary | ICD-10-CM | POA: Diagnosis not present

## 2017-10-12 DIAGNOSIS — S7291XA Unspecified fracture of right femur, initial encounter for closed fracture: Secondary | ICD-10-CM | POA: Diagnosis not present

## 2017-10-12 LAB — VITAMIN D 25 HYDROXY (VIT D DEFICIENCY, FRACTURES): VIT D 25 HYDROXY: 26.6 ng/mL — AB (ref 30.0–100.0)

## 2017-10-13 DIAGNOSIS — M84451S Pathological fracture, right femur, sequela: Secondary | ICD-10-CM | POA: Diagnosis not present

## 2017-10-13 DIAGNOSIS — I4891 Unspecified atrial fibrillation: Secondary | ICD-10-CM | POA: Diagnosis not present

## 2017-10-13 DIAGNOSIS — F418 Other specified anxiety disorders: Secondary | ICD-10-CM | POA: Diagnosis not present

## 2017-10-13 DIAGNOSIS — I5032 Chronic diastolic (congestive) heart failure: Secondary | ICD-10-CM | POA: Diagnosis not present

## 2017-10-15 NOTE — Op Note (Signed)
NAME: Tracy Bowman, Tracy Bowman MEDICAL RECORD WU:[id number] ACCOUNT [id number] DATE OF BIRTH:02-Apr-1927 FACILITY: [facility] LOCATION: [facility] PHYSICIAN:Andreya Lacks H. Oluwatimilehin Balfour, MD  OPERATIVE REPORT  DATE OF PROCEDURE:  10/07/2017  PREOPERATIVE DIAGNOSES:   1.  Right supracondylar femur fracture. 2.  Six weeks status post intramedullary nailing of right 3-part intertrochanteric fracture.  POSTOPERATIVE DIAGNOSES:   1.  Right supracondylar femur fracture.  2.  Six weeks status post intramedullary nailing of right 3-part intertrochanteric fracture.  PROCEDURES: 1.  Open reduction internal fixation of right supracondylar femur fracture. 2.  Removal of deep implant, right hip nail locking bolt.  SURGEON:  Altamese Schubert, MD  ASSISTANT:  Ainsley Spinner PA-C.  ANESTHESIA:  General.  COMPLICATIONS:  None.  ESTIMATED BLOOD LOSS:  About 250 mL.  SPECIMENS:  None.  COMPLICATIONS:  None.  DISPOSITION:  To PACU.  CONDITION:  Stable.  BRIEF SUMMARY AND INDICATIONS FOR PROCEDURE:  The patient is a very pleasant 83 year old female who sustained a fall while recovering from a right intertrochanteric hip fracture treated with an intramedullary nailing.  The nail had developed some  collapse, but well controlled and in keeping with the implant at the hip but without dislodging of the locking bolt.  There was shortening and rotation.  I discussed with her and her husband the risks and benefits of surgery including the possibility of  infection, nerve injury, vessel injury, DVT, PE, heart attack, stroke, loss of motion and need for further surgery and others.  After full discussion, they did wish to proceed.  SUMMARY OF PROCEDURE:  The patient was taken to the operating room where general anesthesia was induced.  The right lower extremity was prepped and draped in usual sterile fashion going up to the hip and down below the knee with chlorhexidine and  Betadine scrub and paint.  A timeout was held.  A  C-arm was  brought in to confirm the position of the old locking bolt about the hip.  Incision was made here.  Dissection carried down and the bolt removed to facilitate a supracondylar plating.  Attention was then turned to the distal femur where a lateral incision was made and dissection carried down through the retinaculum.  A plate was affixed to this side in an effort made at a closed reduction and derotation.  I was unable to adequately  mobilize the fracture and dial in any acceptable reduction  with regard to length translation and alignment.  Consequently, incision was extended proximally.  The fracture site was exposed directly and cleaned of hematoma.  I was then able to directly  gauge the reduction, which was pulled distally by my assistant and then secured with carefully placed cerclage wire.  These wires were tightened and secured and appeared to reproduce an anatomic or near anatomic reduction.  Again, it was difficult to  control because of the spiral nature of this supracondylar fracture.  Bumps were placed.  AP and lateral views obtained and then a fixation placed distally.  As I continued my fixation proximally, it appeared that the fracture site extended quite a bit  past the nail and I was concerned about the ability to get adequate fixation and consequently, a decision was made to apply another 0.5 cm of traction and then a secure bicortical fixation through the locking bolt site of the previous nail.  This was  accomplished and final images showed bicortical purchase there as well as 1 posterior to the nail and distal to the nail.  Also, unicortical screw was  placed.  During the distraction, I delivered to lineup locking bolt sites.  There appeared to be a very  slight posterior translation of the distal fragment, although all the fixation remained in place and adequate and because of her bone quality, I did not wish to remove and revise the alignment and consequently, the construct was  accepted.  Aggressive  irrigation was performed in a standard layer closure using #1 Vicryl, 0 Vicryl, 2-0 Vicryl and a 2-0 nylon.  Sterile gently compressive dressing was applied.  Ainsley Spinner, PA-C, assisted me throughout and assistance was assistant was absolutely necessary  for the safe and effective completion of the case.  PROGNOSIS:  The patient will be nonweightbearing with unrestricted range of motion of both the right knee and hip.  She will resume DVT prophylaxis anticoagulation.  She is at elevated risk for complications given her comorbidities and previous falls in  addition to her age.  AN/NUANCE  D:10/14/2017 T:10/15/2017 JOB:[id number]

## 2017-10-16 DIAGNOSIS — M6281 Muscle weakness (generalized): Secondary | ICD-10-CM | POA: Diagnosis not present

## 2017-10-16 DIAGNOSIS — F418 Other specified anxiety disorders: Secondary | ICD-10-CM | POA: Diagnosis not present

## 2017-10-19 DIAGNOSIS — I5032 Chronic diastolic (congestive) heart failure: Secondary | ICD-10-CM | POA: Diagnosis not present

## 2017-10-19 DIAGNOSIS — R531 Weakness: Secondary | ICD-10-CM | POA: Diagnosis not present

## 2017-10-19 DIAGNOSIS — R42 Dizziness and giddiness: Secondary | ICD-10-CM | POA: Diagnosis not present

## 2017-10-19 DIAGNOSIS — R609 Edema, unspecified: Secondary | ICD-10-CM | POA: Diagnosis not present

## 2017-10-20 DIAGNOSIS — M84451S Pathological fracture, right femur, sequela: Secondary | ICD-10-CM | POA: Diagnosis not present

## 2017-10-20 DIAGNOSIS — M6281 Muscle weakness (generalized): Secondary | ICD-10-CM | POA: Diagnosis not present

## 2017-10-20 DIAGNOSIS — I5032 Chronic diastolic (congestive) heart failure: Secondary | ICD-10-CM | POA: Diagnosis not present

## 2017-10-20 DIAGNOSIS — F418 Other specified anxiety disorders: Secondary | ICD-10-CM | POA: Diagnosis not present

## 2017-10-25 DIAGNOSIS — S72401D Unspecified fracture of lower end of right femur, subsequent encounter for closed fracture with routine healing: Secondary | ICD-10-CM | POA: Diagnosis not present

## 2017-10-30 DIAGNOSIS — F411 Generalized anxiety disorder: Secondary | ICD-10-CM | POA: Diagnosis not present

## 2017-11-10 DIAGNOSIS — I4891 Unspecified atrial fibrillation: Secondary | ICD-10-CM | POA: Diagnosis not present

## 2017-11-10 DIAGNOSIS — D649 Anemia, unspecified: Secondary | ICD-10-CM | POA: Diagnosis not present

## 2017-11-10 DIAGNOSIS — M6281 Muscle weakness (generalized): Secondary | ICD-10-CM | POA: Diagnosis not present

## 2017-11-10 DIAGNOSIS — M84451S Pathological fracture, right femur, sequela: Secondary | ICD-10-CM | POA: Diagnosis not present

## 2017-11-13 DIAGNOSIS — F411 Generalized anxiety disorder: Secondary | ICD-10-CM | POA: Diagnosis not present

## 2017-11-13 DIAGNOSIS — F329 Major depressive disorder, single episode, unspecified: Secondary | ICD-10-CM | POA: Diagnosis not present

## 2017-11-20 DIAGNOSIS — Z90710 Acquired absence of both cervix and uterus: Secondary | ICD-10-CM | POA: Diagnosis not present

## 2017-11-20 DIAGNOSIS — Z23 Encounter for immunization: Secondary | ICD-10-CM | POA: Diagnosis not present

## 2017-11-20 DIAGNOSIS — Z08 Encounter for follow-up examination after completed treatment for malignant neoplasm: Secondary | ICD-10-CM | POA: Diagnosis not present

## 2017-11-20 DIAGNOSIS — Z8544 Personal history of malignant neoplasm of other female genital organs: Secondary | ICD-10-CM | POA: Diagnosis not present

## 2017-11-20 DIAGNOSIS — C5701 Malignant neoplasm of right fallopian tube: Secondary | ICD-10-CM | POA: Diagnosis not present

## 2017-11-22 DIAGNOSIS — S72401D Unspecified fracture of lower end of right femur, subsequent encounter for closed fracture with routine healing: Secondary | ICD-10-CM | POA: Diagnosis not present

## 2017-12-12 DIAGNOSIS — E039 Hypothyroidism, unspecified: Secondary | ICD-10-CM | POA: Diagnosis not present

## 2017-12-12 DIAGNOSIS — F418 Other specified anxiety disorders: Secondary | ICD-10-CM | POA: Diagnosis not present

## 2017-12-12 DIAGNOSIS — M6281 Muscle weakness (generalized): Secondary | ICD-10-CM | POA: Diagnosis not present

## 2017-12-12 DIAGNOSIS — M84451S Pathological fracture, right femur, sequela: Secondary | ICD-10-CM | POA: Diagnosis not present

## 2017-12-14 DIAGNOSIS — M6281 Muscle weakness (generalized): Secondary | ICD-10-CM | POA: Diagnosis not present

## 2017-12-14 DIAGNOSIS — M84451S Pathological fracture, right femur, sequela: Secondary | ICD-10-CM | POA: Diagnosis not present

## 2017-12-14 DIAGNOSIS — E039 Hypothyroidism, unspecified: Secondary | ICD-10-CM | POA: Diagnosis not present

## 2017-12-14 DIAGNOSIS — J069 Acute upper respiratory infection, unspecified: Secondary | ICD-10-CM | POA: Diagnosis not present

## 2017-12-18 DIAGNOSIS — J40 Bronchitis, not specified as acute or chronic: Secondary | ICD-10-CM | POA: Diagnosis not present

## 2018-01-01 DIAGNOSIS — S72401D Unspecified fracture of lower end of right femur, subsequent encounter for closed fracture with routine healing: Secondary | ICD-10-CM | POA: Diagnosis not present

## 2018-01-15 DIAGNOSIS — E038 Other specified hypothyroidism: Secondary | ICD-10-CM | POA: Diagnosis not present

## 2018-01-15 DIAGNOSIS — I10 Essential (primary) hypertension: Secondary | ICD-10-CM | POA: Diagnosis not present

## 2018-01-15 DIAGNOSIS — F418 Other specified anxiety disorders: Secondary | ICD-10-CM | POA: Diagnosis not present

## 2018-01-15 DIAGNOSIS — G59 Mononeuropathy in diseases classified elsewhere: Secondary | ICD-10-CM | POA: Diagnosis not present

## 2018-01-23 DIAGNOSIS — Z1231 Encounter for screening mammogram for malignant neoplasm of breast: Secondary | ICD-10-CM | POA: Diagnosis not present

## 2018-01-30 DIAGNOSIS — S72401D Unspecified fracture of lower end of right femur, subsequent encounter for closed fracture with routine healing: Secondary | ICD-10-CM | POA: Diagnosis not present

## 2018-02-27 DIAGNOSIS — E038 Other specified hypothyroidism: Secondary | ICD-10-CM | POA: Diagnosis not present

## 2018-02-27 DIAGNOSIS — G59 Mononeuropathy in diseases classified elsewhere: Secondary | ICD-10-CM | POA: Diagnosis not present

## 2018-02-27 DIAGNOSIS — F418 Other specified anxiety disorders: Secondary | ICD-10-CM | POA: Diagnosis not present

## 2018-02-27 DIAGNOSIS — I10 Essential (primary) hypertension: Secondary | ICD-10-CM | POA: Diagnosis not present

## 2018-03-07 DIAGNOSIS — I872 Venous insufficiency (chronic) (peripheral): Secondary | ICD-10-CM | POA: Diagnosis not present

## 2018-03-08 DIAGNOSIS — I872 Venous insufficiency (chronic) (peripheral): Secondary | ICD-10-CM | POA: Diagnosis not present

## 2018-03-08 DIAGNOSIS — R2241 Localized swelling, mass and lump, right lower limb: Secondary | ICD-10-CM | POA: Diagnosis not present

## 2018-03-20 DIAGNOSIS — J040 Acute laryngitis: Secondary | ICD-10-CM | POA: Diagnosis not present

## 2018-04-06 DIAGNOSIS — I10 Essential (primary) hypertension: Secondary | ICD-10-CM | POA: Diagnosis not present

## 2018-04-06 DIAGNOSIS — E038 Other specified hypothyroidism: Secondary | ICD-10-CM | POA: Diagnosis not present

## 2018-04-06 DIAGNOSIS — F418 Other specified anxiety disorders: Secondary | ICD-10-CM | POA: Diagnosis not present

## 2018-04-06 DIAGNOSIS — G59 Mononeuropathy in diseases classified elsewhere: Secondary | ICD-10-CM | POA: Diagnosis not present

## 2018-05-09 DIAGNOSIS — S72401D Unspecified fracture of lower end of right femur, subsequent encounter for closed fracture with routine healing: Secondary | ICD-10-CM | POA: Diagnosis not present

## 2018-05-21 DIAGNOSIS — Z8544 Personal history of malignant neoplasm of other female genital organs: Secondary | ICD-10-CM | POA: Diagnosis not present

## 2018-05-21 DIAGNOSIS — C562 Malignant neoplasm of left ovary: Secondary | ICD-10-CM | POA: Diagnosis not present

## 2018-05-22 DIAGNOSIS — E038 Other specified hypothyroidism: Secondary | ICD-10-CM | POA: Diagnosis not present

## 2018-05-22 DIAGNOSIS — F418 Other specified anxiety disorders: Secondary | ICD-10-CM | POA: Diagnosis not present

## 2018-05-22 DIAGNOSIS — G59 Mononeuropathy in diseases classified elsewhere: Secondary | ICD-10-CM | POA: Diagnosis not present

## 2018-05-22 DIAGNOSIS — I10 Essential (primary) hypertension: Secondary | ICD-10-CM | POA: Diagnosis not present

## 2018-05-30 ENCOUNTER — Telehealth: Payer: Self-pay | Admitting: *Deleted

## 2018-05-30 NOTE — Telephone Encounter (Signed)
Pt verbalized consent for telehealth appt with Dr Bronson Ing on 05/31/18. Pt will have weight/meds available

## 2018-05-31 ENCOUNTER — Encounter: Payer: Self-pay | Admitting: Cardiovascular Disease

## 2018-05-31 ENCOUNTER — Other Ambulatory Visit: Payer: Self-pay

## 2018-05-31 ENCOUNTER — Telehealth (INDEPENDENT_AMBULATORY_CARE_PROVIDER_SITE_OTHER): Payer: Medicare Other | Admitting: Cardiovascular Disease

## 2018-05-31 ENCOUNTER — Encounter: Payer: Self-pay | Admitting: *Deleted

## 2018-05-31 VITALS — Ht 66.0 in | Wt 160.0 lb

## 2018-05-31 DIAGNOSIS — I83893 Varicose veins of bilateral lower extremities with other complications: Secondary | ICD-10-CM

## 2018-05-31 DIAGNOSIS — I4821 Permanent atrial fibrillation: Secondary | ICD-10-CM

## 2018-05-31 DIAGNOSIS — I10 Essential (primary) hypertension: Secondary | ICD-10-CM

## 2018-05-31 DIAGNOSIS — R079 Chest pain, unspecified: Secondary | ICD-10-CM | POA: Diagnosis not present

## 2018-05-31 NOTE — Progress Notes (Signed)
Virtual Visit via Telephone Note   This visit type was conducted due to national recommendations for restrictions regarding the COVID-19 Pandemic (e.g. social distancing) in an effort to limit this patient's exposure and mitigate transmission in our community.  Due to her co-morbid illnesses, this patient is at least at moderate risk for complications without adequate follow up.  This format is felt to be most appropriate for this patient at this time.  The patient did not have access to video technology/had technical difficulties with video requiring transitioning to audio format only (telephone).  All issues noted in this document were discussed and addressed.  No physical exam could be performed with this format.  Please refer to the patient's chart for her  consent to telehealth for [facility]   Date:  05/31/2018   ID:  Tracy Bowman, DOB [birth year], MRN [id number]  Patient Location: Home Provider Location: Office  PCP:  Neale Burly, MD  Cardiologist:  No primary care provider on file.  Electrophysiologist:  None   Evaluation Performed:  Follow-Up Visit  Chief Complaint: Permanent atrial fibrillation  History of Present Illness:    Tracy Bowman is a 85 y.o. female with a history of permanent atrial fibrillation.  She was hospitalized for right distal femoral fracture and underwent open reduction and internal fixation on 10/07/2017.  She has laryngitis. She uses a walker to ambulate. She has occasional chest pains but cannot tell me how frequent they occur. She says "they're not hard but light pains". She denies shortness of breath.  The patient does not have symptoms concerning for COVID-19 infection (fever, chills, cough, or new shortness of breath).   Soc: Her husband, Francee Piccolo, is also a patient of mine.  They will have been married 45 years on June 04, 2018.   Past Medical History:  Diagnosis Date  . Anxiety neurosis   . Arthritis    "a touch in my hands"  (10/09/2017)  . 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 Chronic atrial fibrillation   . Chronic diastolic (congestive) heart failure ([place])   . Depression with anxiety   . Hyperlipidemia   . Hypothyroidism    Past Surgical History:  Procedure Laterality Date  . CATARACT EXTRACTION W/ INTRAOCULAR LENS  IMPLANT, BILATERAL Bilateral   . COLECTOMY Left 12/03/2013   hemicolectomy/notes 12/03/2013  . FRACTURE SURGERY    . HIP FRACTURE SURGERY Right 2018; ~ 08/2017  . ORIF FEMUR FRACTURE Right 10/07/2017   Procedure: OPEN REDUCTION INTERNAL FIXATION (ORIF) DISTAL FEMUR FRACTURE;  Surgeon: Altamese M[place], MD;  Location: [facility];  Service: Orthopedics;  Laterality: Right;  . TOTAL ABDOMINAL HYSTERECTOMY  12/03/2013   Archie Endo 12/03/2013     Current Meds  Medication Sig  . acetaminophen (TYLENOL) 325 MG tablet Take 2 tablets (650 mg total) by mouth every 6 (six) hours as needed.  Marland Kitchen alendronate (FOSAMAX) 70 MG tablet Take 70 mg by mouth once a week.   . ALPRAZolam (XANAX) 0.5 MG tablet Take 1 tablet (0.5 mg total) by mouth at bedtime as needed.  Marland Kitchen aspirin EC 81 MG EC tablet Take 1 tablet (81 mg total) by mouth daily.  Marland Kitchen azelastine (ASTELIN) 0.1 % nasal spray Place 2 sprays into both nostrils daily. Use in each nostril as directed  . CVS VITAMIN B12 1000 MCG tablet Take 1,000 mcg by mouth daily.  Marland Kitchen diltiazem (TIAZAC) 120 MG 24 hr capsule Take 120  mg by mouth daily.  Marland Kitchen docusate sodium (COLACE) 100 MG capsule Take 1 capsule (100 mg total) by mouth 2 (two) times daily.  Marland Kitchen enoxaparin (LOVENOX) 40 MG/0.4ML injection Inject 0.4 mLs (40 mg total) into the skin daily.  . feeding supplement (ENSURE SURGERY) LIQD Take 237 mLs by mouth 2 (two) times daily between meals.  . ferrous sulfate 325 (65 FE) MG tablet Take 1 tablet (325 mg total) by mouth daily.  . furosemide (LASIX) 20 MG tablet Take 20 mg by mouth daily.   Marland Kitchen levothyroxine  (SYNTHROID, LEVOTHROID) 25 MCG tablet Take 25 mcg by mouth daily before breakfast.   . Magnesium 200 MG TABS Take 1 tablet by mouth daily.  . meclizine (ANTIVERT) 12.5 MG tablet Take 12.5 mg by mouth 3 (three) times daily as needed for dizziness.  . midodrine (PROAMATINE) 2.5 MG tablet Take 2.5 mg by mouth 2 (two) times daily.  . mirtazapine (REMERON) 15 MG tablet Take by mouth.  . Multiple Vitamins-Minerals (CVS ONE DAILY ESSENTIAL) TABS Take 1 tablet by mouth daily.  . Omega-3 Fatty Acids (FISH OIL) 1000 MG CAPS Take 1,000 mg by mouth 2 (two) times daily.   Marland Kitchen oxyCODONE (OXY IR/ROXICODONE) 5 MG immediate release tablet Take 1 tablet (5 mg total) by mouth every 3 (three) hours as needed for moderate pain or severe pain.  Marland Kitchen sertraline (ZOLOFT) 50 MG tablet Take 50 mg by mouth daily.     Allergies:   Morphine and Oxycodone   Social History   Tobacco Use  . Smoking status: Never Smoker  . Smokeless tobacco: Never Used  Substance Use Topics  . Alcohol use: Never    Alcohol/week: 0.0 standard drinks    Frequency: Never  . Drug use: Never     Family Hx: The patient's family history includes Breast cancer in her sister; Colon cancer in her father; Congestive Heart Failure in her mother; Dementia in her brother.  ROS:   Please see the history of present illness.     All other systems reviewed and are negative.   Prior CV studies:   The following studies were reviewed today:  Echocardiogram was performed at [facility] on 04/16/14 and demonstrated normal left ventricular systolic function, EF 76-73%, diastolic dysfunction, moderate left atrial dilatation, moderate right atrial dilatation, mild aortic sclerosis, mild mitral regurgitation, moderate tricuspid regurgitation with RVSP 45 mmHg, and moderate concentric LVH.  Labs/Other Tests and Data Reviewed:    EKG:  No ECG reviewed.  Recent Labs: 10/07/2017: B Natriuretic Peptide 50.2 10/10/2017: BUN 19; Creatinine, Ser 0.54;  Potassium 4.0; Sodium 140 10/11/2017: Hemoglobin 7.2; Platelets 226   Recent Lipid Panel Lab Results  Component Value Date/Time   CHOL 132 10/07/2017 03:56 AM   TRIG 66 10/07/2017 03:56 AM   HDL 43 10/07/2017 03:56 AM   CHOLHDL 3.1 10/07/2017 03:56 AM   LDLCALC 76 10/07/2017 03:56 AM    Wt Readings from Last 3 Encounters:  05/31/18 160 lb (72.6 kg)  05/16/17 176 lb (79.8 kg)  03/28/16 174 lb (78.9 kg)     Objective:    Vital Signs:  Ht 5\' 6"  (1.676 m)   Wt 160 lb (72.6 kg)   BMI 25.82 kg/m    VITAL SIGNS:  reviewed  ASSESSMENT & PLAN:    1. Permanent atrial fibrillation: Symptomatically stable. She has moderate left atrial enlargement. Currently on long-acting diltiazem 120 mg daily (had been on Toprol-XL at last visit). CHADSVASC score is sufficiently high enough to warrant anticoagulation  therapy. However, she was reluctant to take Xarelto 15 mg daily. Given herhistory offalls, thisis likelythe most prudent strategy.  She is on aspirin 81 mg.  2. Essential HTN: This will need continued monitoring. No changes to meds.  3. Bilateral leg swelling:Due to chronic venous insufficiency. Continue compression stockings.   She also takes Lasix 20 mg daily.  4. Chest pain: I will proceed with a nuclear myocardial perfusion imaging study to evaluate for ischemic heart disease (Lexiscan Myoview).     COVID-19 Education: The signs and symptoms of COVID-19 were discussed with the patient and how to seek care for testing (follow up with PCP or arrange E-visit).  The importance of social distancing was discussed today.  Time:   Today, I have spent 25 minutes with the patient with telehealth technology discussing the above problems.     Medication Adjustments/Labs and Tests Ordered: Current medicines are reviewed at length with the patient today.  Concerns regarding medicines are outlined above.   Tests Ordered: No orders of the defined types were placed in this encounter.    Medication Changes: No orders of the defined types were placed in this encounter.   Disposition:  Follow up in 6 month(s)  Signed, Kate Sable, MD  05/31/2018 11:00 AM    [facility]

## 2018-05-31 NOTE — Patient Instructions (Signed)
Medication Instructions:  Continue all current medications.  Labwork: none  Testing/Procedures:  Your physician has requested that you have a lexiscan myoview. For further information please visit [email or web address]. Please follow instruction sheet, as given.  Office will contact with results via phone or letter.    Follow-Up: Your physician wants you to follow up in: 6 months.  You will receive a reminder letter in the mail one-two months in advance.  If you don't receive a letter, please call our office to schedule the follow up appointment   Any Other Special Instructions Will Be Listed Below (If Applicable).  If you need a refill on your cardiac medications before your next appointment, please call your pharmacy.  

## 2018-05-31 NOTE — Addendum Note (Signed)
Addended by: Laurine Blazer on: 05/31/2018 12:17 PM   Modules accepted: Orders

## 2018-06-08 ENCOUNTER — Other Ambulatory Visit: Payer: Self-pay

## 2018-06-08 ENCOUNTER — Encounter (HOSPITAL_COMMUNITY): Admission: RE | Admit: 2018-06-08 | Payer: Medicare Other | Source: Ambulatory Visit

## 2018-06-08 ENCOUNTER — Ambulatory Visit (HOSPITAL_COMMUNITY)
Admission: RE | Admit: 2018-06-08 | Discharge: 2018-06-08 | Disposition: A | Discharge status: Home / Self Care | Payer: Medicare Other | Source: Ambulatory Visit | Attending: Cardiovascular Disease | Admitting: Cardiovascular Disease

## 2018-06-08 DIAGNOSIS — R079 Chest pain, unspecified: Secondary | ICD-10-CM

## 2018-06-13 ENCOUNTER — Telehealth: Payer: Self-pay | Admitting: Cardiovascular Disease

## 2018-06-13 NOTE — Telephone Encounter (Signed)
Pre-cert Verification for the following procedure    lexiscan myoview scheduled for 06/19/2018 at [facility]

## 2018-06-14 DIAGNOSIS — I10 Essential (primary) hypertension: Secondary | ICD-10-CM | POA: Diagnosis not present

## 2018-06-14 DIAGNOSIS — Z1389 Encounter for screening for other disorder: Secondary | ICD-10-CM | POA: Diagnosis not present

## 2018-06-14 DIAGNOSIS — E038 Other specified hypothyroidism: Secondary | ICD-10-CM | POA: Diagnosis not present

## 2018-06-14 DIAGNOSIS — K210 Gastro-esophageal reflux disease with esophagitis: Secondary | ICD-10-CM | POA: Diagnosis not present

## 2018-06-14 DIAGNOSIS — Z0000 Encounter for general adult medical examination without abnormal findings: Secondary | ICD-10-CM | POA: Diagnosis not present

## 2018-06-19 ENCOUNTER — Ambulatory Visit (HOSPITAL_COMMUNITY)
Admission: RE | Admit: 2018-06-19 | Discharge: 2018-06-19 | Disposition: A | Discharge status: Home / Self Care | Payer: Medicare Other | Source: Ambulatory Visit | Attending: Cardiovascular Disease | Admitting: Cardiovascular Disease

## 2018-06-19 ENCOUNTER — Encounter (HOSPITAL_COMMUNITY): Payer: Self-pay

## 2018-06-19 ENCOUNTER — Other Ambulatory Visit: Payer: Self-pay

## 2018-06-19 ENCOUNTER — Encounter (HOSPITAL_COMMUNITY): Payer: Medicare Other

## 2018-06-19 DIAGNOSIS — R079 Chest pain, unspecified: Secondary | ICD-10-CM | POA: Diagnosis not present

## 2018-06-19 LAB — NM MYOCAR MULTI W/SPECT W/WALL MOTION / EF
LV dias vol: 61 mL (ref 46–106)
LV sys vol: 18 mL
RATE: 0.39
SDS: 0
SRS: 0
SSS: 0
TID: 1

## 2018-06-19 MED ORDER — SODIUM CHLORIDE 0.9% FLUSH
INTRAVENOUS | Status: AC
  Administered 2018-06-19: 10 mL via INTRAVENOUS
  Filled 2018-06-19: qty 10

## 2018-06-19 MED ORDER — TECHNETIUM TC 99M TETROFOSMIN IV KIT
10.0000 | PACK | Freq: Once | INTRAVENOUS | Status: AC | PRN
  Administered 2018-06-19: 10:00:00 11 via INTRAVENOUS

## 2018-06-19 MED ORDER — REGADENOSON 0.4 MG/5ML IV SOLN
INTRAVENOUS | Status: AC
  Administered 2018-06-19: 11:00:00 0.4 mg via INTRAVENOUS
  Filled 2018-06-19: qty 5

## 2018-06-19 MED ORDER — TECHNETIUM TC 99M TETROFOSMIN IV KIT
30.0000 | PACK | Freq: Once | INTRAVENOUS | Status: AC | PRN
  Administered 2018-06-19: 31.5 via INTRAVENOUS

## 2018-06-21 ENCOUNTER — Telehealth: Payer: Self-pay | Admitting: *Deleted

## 2018-06-21 NOTE — Telephone Encounter (Signed)
Notes recorded by Laurine Blazer, LPN on 03/04/4994 at 9:24 PM EDT  Patient notified. Copy to pmd.  ------   Notes recorded by Arnoldo Lenis, MD on 06/21/2018 at 12:39 PM EDT  Dr Raliegh Ip patient, stress test looks good without evidence of any blockages, keep regular f/u    Zandra Abts MD

## 2018-06-28 DIAGNOSIS — E038 Other specified hypothyroidism: Secondary | ICD-10-CM | POA: Diagnosis not present

## 2018-06-28 DIAGNOSIS — I10 Essential (primary) hypertension: Secondary | ICD-10-CM | POA: Diagnosis not present

## 2018-06-28 DIAGNOSIS — K210 Gastro-esophageal reflux disease with esophagitis: Secondary | ICD-10-CM | POA: Diagnosis not present

## 2018-07-11 DIAGNOSIS — I10 Essential (primary) hypertension: Secondary | ICD-10-CM | POA: Diagnosis not present

## 2018-07-11 DIAGNOSIS — K210 Gastro-esophageal reflux disease with esophagitis: Secondary | ICD-10-CM | POA: Diagnosis not present

## 2018-07-11 DIAGNOSIS — E038 Other specified hypothyroidism: Secondary | ICD-10-CM | POA: Diagnosis not present

## 2018-08-06 ENCOUNTER — Other Ambulatory Visit: Payer: Self-pay

## 2018-08-16 DIAGNOSIS — H5203 Hypermetropia, bilateral: Secondary | ICD-10-CM | POA: Diagnosis not present

## 2018-08-16 DIAGNOSIS — H524 Presbyopia: Secondary | ICD-10-CM | POA: Diagnosis not present

## 2018-08-16 DIAGNOSIS — H52223 Regular astigmatism, bilateral: Secondary | ICD-10-CM | POA: Diagnosis not present

## 2018-08-16 DIAGNOSIS — Z961 Presence of intraocular lens: Secondary | ICD-10-CM | POA: Diagnosis not present

## 2018-08-16 DIAGNOSIS — Z9849 Cataract extraction status, unspecified eye: Secondary | ICD-10-CM | POA: Diagnosis not present

## 2018-08-28 DIAGNOSIS — E038 Other specified hypothyroidism: Secondary | ICD-10-CM | POA: Diagnosis not present

## 2018-08-28 DIAGNOSIS — K210 Gastro-esophageal reflux disease with esophagitis: Secondary | ICD-10-CM | POA: Diagnosis not present

## 2018-08-28 DIAGNOSIS — I10 Essential (primary) hypertension: Secondary | ICD-10-CM | POA: Diagnosis not present

## 2018-09-03 ENCOUNTER — Ambulatory Visit (INDEPENDENT_AMBULATORY_CARE_PROVIDER_SITE_OTHER): Payer: Medicare Other | Admitting: Otolaryngology

## 2018-09-03 DIAGNOSIS — R490 Dysphonia: Secondary | ICD-10-CM | POA: Diagnosis not present

## 2018-09-03 DIAGNOSIS — J381 Polyp of vocal cord and larynx: Secondary | ICD-10-CM | POA: Diagnosis not present

## 2018-09-11 DIAGNOSIS — K210 Gastro-esophageal reflux disease with esophagitis: Secondary | ICD-10-CM | POA: Diagnosis not present

## 2018-09-11 DIAGNOSIS — E038 Other specified hypothyroidism: Secondary | ICD-10-CM | POA: Diagnosis not present

## 2018-09-11 DIAGNOSIS — I10 Essential (primary) hypertension: Secondary | ICD-10-CM | POA: Diagnosis not present

## 2018-09-25 ENCOUNTER — Other Ambulatory Visit: Payer: Self-pay | Admitting: Otolaryngology

## 2018-09-28 ENCOUNTER — Other Ambulatory Visit: Payer: Self-pay

## 2018-09-28 ENCOUNTER — Encounter (HOSPITAL_BASED_OUTPATIENT_CLINIC_OR_DEPARTMENT_OTHER): Payer: Self-pay | Admitting: *Deleted

## 2018-10-01 DIAGNOSIS — I10 Essential (primary) hypertension: Secondary | ICD-10-CM | POA: Diagnosis not present

## 2018-10-01 DIAGNOSIS — E038 Other specified hypothyroidism: Secondary | ICD-10-CM | POA: Diagnosis not present

## 2018-10-01 DIAGNOSIS — F320 Major depressive disorder, single episode, mild: Secondary | ICD-10-CM | POA: Diagnosis not present

## 2018-10-01 DIAGNOSIS — K210 Gastro-esophageal reflux disease with esophagitis: Secondary | ICD-10-CM | POA: Diagnosis not present

## 2018-10-02 ENCOUNTER — Encounter (HOSPITAL_BASED_OUTPATIENT_CLINIC_OR_DEPARTMENT_OTHER)
Admission: RE | Admit: 2018-10-02 | Discharge: 2018-10-02 | Disposition: A | Discharge status: Home / Self Care | Payer: Medicare Other | Source: Ambulatory Visit | Attending: Otolaryngology | Admitting: Otolaryngology

## 2018-10-02 ENCOUNTER — Other Ambulatory Visit (HOSPITAL_COMMUNITY): Admission: RE | Admit: 2018-10-02 | Payer: Medicare Other | Source: Ambulatory Visit

## 2018-10-02 ENCOUNTER — Other Ambulatory Visit: Payer: Self-pay

## 2018-10-02 DIAGNOSIS — I110 Hypertensive heart disease with heart failure: Secondary | ICD-10-CM | POA: Insufficient documentation

## 2018-10-02 DIAGNOSIS — I482 Chronic atrial fibrillation, unspecified: Secondary | ICD-10-CM | POA: Diagnosis not present

## 2018-10-02 DIAGNOSIS — R9431 Abnormal electrocardiogram [ECG] [EKG]: Secondary | ICD-10-CM | POA: Insufficient documentation

## 2018-10-02 DIAGNOSIS — Z01812 Encounter for preprocedural laboratory examination: Secondary | ICD-10-CM | POA: Diagnosis not present

## 2018-10-02 DIAGNOSIS — I509 Heart failure, unspecified: Secondary | ICD-10-CM | POA: Diagnosis not present

## 2018-10-02 LAB — BASIC METABOLIC PANEL
Anion gap: 9 (ref 5–15)
BUN: 16 mg/dL (ref 8–23)
CO2: 27 mmol/L (ref 22–32)
Calcium: 8.8 mg/dL — ABNORMAL LOW (ref 8.9–10.3)
Chloride: 104 mmol/L (ref 98–111)
Creatinine, Ser: 0.75 mg/dL (ref 0.44–1.00)
GFR calc Af Amer: >60 mL/min (ref >60)
GFR calc non Af Amer: >60 mL/min (ref >60)
Glucose, Bld: 193 mg/dL — ABNORMAL HIGH (ref 70–99)
Potassium: 3.9 mmol/L (ref 3.5–5.1)
Sodium: 140 mmol/L (ref 135–145)

## 2018-10-02 NOTE — Progress Notes (Signed)
EKG reviewed by Dr. Fransisco Beau.  Surgery being rescheduled and moved to main hospital. Notified Heather at Dr. Deeann Saint office.  Patient verbalized understanding.

## 2018-10-06 ENCOUNTER — Inpatient Hospital Stay (HOSPITAL_COMMUNITY)
Admission: RE | Admit: 2018-10-06 | Discharge: 2018-10-06 | Disposition: A | Discharge status: Home / Self Care | Payer: Medicare Other | Source: Ambulatory Visit | Attending: Otolaryngology | Admitting: Otolaryngology

## 2018-10-06 NOTE — Progress Notes (Signed)
Called patient to see if they were still coming for Covid testing appt for today.  No answer left message that we are open to 12:30 today.

## 2018-10-08 ENCOUNTER — Other Ambulatory Visit (HOSPITAL_COMMUNITY)
Admission: RE | Admit: 2018-10-08 | Discharge: 2018-10-08 | Disposition: A | Discharge status: Home / Self Care | Payer: Medicare Other | Source: Ambulatory Visit | Attending: Otolaryngology | Admitting: Otolaryngology

## 2018-10-08 DIAGNOSIS — Z01812 Encounter for preprocedural laboratory examination: Secondary | ICD-10-CM | POA: Diagnosis not present

## 2018-10-08 DIAGNOSIS — Z20828 Contact with and (suspected) exposure to other viral communicable diseases: Secondary | ICD-10-CM | POA: Insufficient documentation

## 2018-10-08 DIAGNOSIS — J382 Nodules of vocal cords: Secondary | ICD-10-CM | POA: Insufficient documentation

## 2018-10-08 LAB — SARS CORONAVIRUS 2 (TAT 6-24 HRS): SARS Coronavirus 2: NEGATIVE

## 2018-10-09 ENCOUNTER — Other Ambulatory Visit: Payer: Self-pay

## 2018-10-09 ENCOUNTER — Encounter (HOSPITAL_COMMUNITY): Payer: Self-pay | Admitting: *Deleted

## 2018-10-09 NOTE — Anesthesia Preprocedure Evaluation (Addendum)
Anesthesia Evaluation  Patient identified by MRN, date of birth, ID band Patient awake    Reviewed: Allergy & Precautions, NPO status , Patient's Chart, lab work & pertinent test results  History of Anesthesia Complications Negative for: history of anesthetic complications  Airway Mallampati: II  TM Distance: >3 FB Neck ROM: Full    Dental  (+) Dental Advisory Given, Missing   Pulmonary neg pulmonary ROS,  10/08/2018 SARS coronavirus NEG   breath sounds clear to auscultation       Cardiovascular (-) angina+ dysrhythmias Atrial Fibrillation  Rhythm:Irregular Rate:Normal  Nuclear stress test 06/19/18: no ST segment deviation noted during stress, study is normal. There are no perfusion defects, EF >65%  According to 05/31/18 note by Dr. Bronson Ing: "Echocardiogram was performed at [facility] on 04/16/14 and demonstrated normal left ventricular systolic function, EF 123456, diastolic dysfunction, moderate left atrial dilatation, moderate right atrial dilatation, mild aortic sclerosis, mild mitral regurgitation, moderate tricuspid regurgitation with RVSP 45 mmHg, and moderate concentric LVH."    Neuro/Psych Anxiety Depression    GI/Hepatic negative GI ROS, Neg liver ROS,   Endo/Other  Hypothyroidism   Renal/GU negative Renal ROS     Musculoskeletal   Abdominal   Peds  Hematology negative hematology ROS (+)   Anesthesia Other Findings Vocal cord polyp  Reproductive/Obstetrics H/o fallopian tube cancer: surgery, chemo                            Anesthesia Physical Anesthesia Plan  ASA: III  Anesthesia Plan: General   Post-op Pain Management:    Induction: Intravenous  PONV Risk Score and Plan: 3 and Ondansetron and Dexamethasone  Airway Management Planned: Oral ETT  Additional Equipment:   Intra-op Plan:   Post-operative Plan: Extubation in OR  Informed Consent: I have  reviewed the patients History and Physical, chart, labs and discussed the procedure including the risks, benefits and alternatives for the proposed anesthesia with the patient or authorized representative who has indicated his/her understanding and acceptance.     Dental advisory given  Plan Discussed with: CRNA and Surgeon  Anesthesia Plan Comments: (PAT note written 10/09/2018 by Myra Gianotti, PA-C. SAME DAY WORK-UP. Moved from [facility].   )      Anesthesia Quick Evaluation

## 2018-10-09 NOTE — Progress Notes (Signed)
Pt denies SOB and chest pain. Pt stated that she is under the care of Dr. Bronson Ing, Cardiology and Sherrie Sport, PCP. Pt denies having a cardiac cath. Pt denies having a chest x ray within the last year. Pt made aware to stop taking vitamins, fish oil and herbal medications. Do not take any NSAIDs ie: Ibuprofen, Advil, Naproxen (Aleve), Motrin, BC and Goody Powder. Pt verbalized understanding of all pre-op instructions. PA, Anesthesiology, asked to review pt EKG; see note.

## 2018-10-09 NOTE — Progress Notes (Signed)
Anesthesia Chart Review: SAME DAY WORK-UP   Case: [id number] Date/Time: 10/10/18 0930   Procedure: MICRO DIRECT LARYNGOSCOPY WITH EXCISION OF VOCAL CORD MASS (N/A )   Anesthesia type: General   Pre-op diagnosis: VOCAL CORD MASS   Location: [facility] OR ROOM 10 / [place] [facility]   Surgeon: Leta Baptist, MD      DISCUSSION: Patient is an 85 year old female scheduled for the above procedure.  Surgery was moved from [facility] to main OR due to atrial fibrillation history.  History includes never smoker, atrial fibrillation, chronic diastolic CHF, hypothyroidism, HLD, right femur fracture (s/p ORIF 10/07/17), vocal cord mass, stage IIIC right fallopian tube cancer (s/p XL, TAH/BSO, left hemicolectomy with primary anastomosis and tumor debulking 12/03/13 and 6 cycles of IV platinum/taxane based chemotherapy; completed in April, 2016).   Last evaluation by cardiologist Dr. Bronson Ing on 05/31/18 for follow-up permanent atrial fibrillation..  Patient felt symptomatically stable from afib.  CHADVASC score sufficiently high enough to warrant anticoagulation therapy, however patient reluctant to take Xarelto.  Given her history of falls, patient managed on ASA. She did report some vague chest pains, so a nuclear stress test was ordered which was low risk.  CBG and H/H ordered for the morning of surgery. Cr 0.75 on 10/02/18.  Negative COVID-19 test on 10/08/18. Anesthesia team to evaluate on the day of surgery.    VS: Ht 5\' 6"  (1.676 m)   Wt 76.2 kg   BMI 27.12 kg/m   PROVIDERS: Neale Burly, MD is PCP Kate Sable, MD is cardiologist Rayford Halsted, MD is GYN-ONC ([place]). Last evaluation 05/21/18.  Continue surveillance with six-month follow-up recommended.   LABS: BMET on 10/02/18 acceptable. Lab trends since 10/2017 show HGB 7.2-10.5, although 7.2 was following orthopedic surgery.  A1c 6.0 10/07/17 (has had glucose levels of 136-207 on BMET since 10/08/17). Will order CBG and H/H for the morning of  surgery.  (all labs ordered are listed, but only abnormal results are displayed)  Labs Reviewed  BASIC METABOLIC PANEL - Abnormal; Notable for the following components:      Result Value   Glucose, Bld 193 (*)    Calcium 8.8 (*)    All other components within normal limits     EKG: 10/02/18: Atrial fibrillation with premature ventricular or aberrantly conducted complexes (rate 68 bpm) Low voltage QRS Inferior infarct , age undetermined Cannot rule out Anteroseptal infarct , age undetermined Abnormal ECG No significant change since earlier same day Confirmed by Jenkins Rouge [phone]) on 10/02/2018 3:36:52 PM   CV: Nuclear stress test 06/19/18:  There was no ST segment deviation noted during stress.  The study is normal. There are no perfusion defects  This is a low risk study.  The left ventricular ejection fraction is hyperdynamic (>65%).  According to 05/31/18 note by Dr. Bronson Ing: "Echocardiogram was performed at [facility] on 04/16/14 and demonstrated normal left ventricular systolic function, EF [id number], diastolic dysfunction, moderate left atrial dilatation, moderate right atrial dilatation, mild aortic sclerosis, mild mitral regurgitation, moderate tricuspid regurgitation with RVSP 45 mmHg, and moderate concentric LVH."   Past Medical History:  Diagnosis Date  . Anxiety neurosis   . Arthritis    "a touch in my hands" (10/09/2017)  . 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 Chronic atrial fibrillation   . Chronic diastolic (congestive) heart failure ([place])   . Depression with anxiety   . Hyperlipidemia   .  Hypothyroidism   . Vocal cord mass     Past Surgical History:  Procedure Laterality Date  . CATARACT EXTRACTION W/ INTRAOCULAR LENS  IMPLANT, BILATERAL Bilateral   . COLECTOMY Left 12/03/2013   hemicolectomy/notes 12/03/2013  . FRACTURE SURGERY    . HIP FRACTURE SURGERY Right 2018;  ~ 08/2017  . ORIF FEMUR FRACTURE Right 10/07/2017   Procedure: OPEN REDUCTION INTERNAL FIXATION (ORIF) DISTAL FEMUR FRACTURE;  Surgeon: Altamese Cornell, MD;  Location: [place] [facility];  Service: Orthopedics;  Laterality: Right;  . TOTAL ABDOMINAL HYSTERECTOMY  12/03/2013   Archie Endo 12/03/2013    MEDICATIONS: No current facility-administered medications for this encounter.    Marland Kitchen alendronate (FOSAMAX) 70 MG tablet  . ALPRAZolam (XANAX) 0.5 MG tablet  . aspirin EC 81 MG EC tablet  . CVS VITAMIN B12 1000 MCG tablet  . diltiazem (TIAZAC) 120 MG 24 hr capsule  . furosemide (LASIX) 20 MG tablet  . levothyroxine (SYNTHROID, LEVOTHROID) 25 MCG tablet  . Magnesium 200 MG TABS  . midodrine (PROAMATINE) 2.5 MG tablet  . mirtazapine (REMERON) 15 MG tablet  . Multiple Vitamins-Minerals (CVS ONE DAILY ESSENTIAL) TABS  . Omega-3 Fatty Acids (FISH OIL) 1000 MG CAPS  . sertraline (ZOLOFT) 50 MG tablet  . acetaminophen (TYLENOL) 325 MG tablet  . azelastine (ASTELIN) 0.1 % nasal spray    Myra Gianotti, PA-C Surgical Short Stay/Anesthesiology [facility] Phone [phone] [facility] Phone [phone] 10/09/2018 2:31 PM

## 2018-10-10 ENCOUNTER — Ambulatory Visit (HOSPITAL_COMMUNITY): Payer: Medicare Other | Admitting: Anesthesiology

## 2018-10-10 ENCOUNTER — Encounter (HOSPITAL_COMMUNITY): Payer: Self-pay | Admitting: Surgery

## 2018-10-10 ENCOUNTER — Encounter (HOSPITAL_COMMUNITY)
Admission: RE | Disposition: A | Discharge status: Home / Self Care | Payer: Self-pay | Source: Home / Self Care | Attending: Otolaryngology

## 2018-10-10 ENCOUNTER — Ambulatory Visit (HOSPITAL_COMMUNITY)
Admission: RE | Admit: 2018-10-10 | Discharge: 2018-10-10 | Disposition: A | Discharge status: Home / Self Care | Payer: Medicare Other | Attending: Otolaryngology | Admitting: Otolaryngology

## 2018-10-10 DIAGNOSIS — Z7982 Long term (current) use of aspirin: Secondary | ICD-10-CM | POA: Insufficient documentation

## 2018-10-10 DIAGNOSIS — C320 Malignant neoplasm of glottis: Secondary | ICD-10-CM | POA: Diagnosis not present

## 2018-10-10 DIAGNOSIS — I5032 Chronic diastolic (congestive) heart failure: Secondary | ICD-10-CM | POA: Diagnosis not present

## 2018-10-10 DIAGNOSIS — R490 Dysphonia: Secondary | ICD-10-CM | POA: Insufficient documentation

## 2018-10-10 DIAGNOSIS — Z79890 Hormone replacement therapy (postmenopausal): Secondary | ICD-10-CM | POA: Insufficient documentation

## 2018-10-10 DIAGNOSIS — Z7983 Long term (current) use of bisphosphonates: Secondary | ICD-10-CM | POA: Diagnosis not present

## 2018-10-10 DIAGNOSIS — E785 Hyperlipidemia, unspecified: Secondary | ICD-10-CM | POA: Diagnosis not present

## 2018-10-10 DIAGNOSIS — E039 Hypothyroidism, unspecified: Secondary | ICD-10-CM | POA: Diagnosis not present

## 2018-10-10 DIAGNOSIS — F329 Major depressive disorder, single episode, unspecified: Secondary | ICD-10-CM | POA: Insufficient documentation

## 2018-10-10 DIAGNOSIS — Z9221 Personal history of antineoplastic chemotherapy: Secondary | ICD-10-CM | POA: Diagnosis not present

## 2018-10-10 DIAGNOSIS — Z79899 Other long term (current) drug therapy: Secondary | ICD-10-CM | POA: Diagnosis not present

## 2018-10-10 DIAGNOSIS — I4821 Permanent atrial fibrillation: Secondary | ICD-10-CM | POA: Diagnosis not present

## 2018-10-10 DIAGNOSIS — F419 Anxiety disorder, unspecified: Secondary | ICD-10-CM | POA: Insufficient documentation

## 2018-10-10 DIAGNOSIS — Z8544 Personal history of malignant neoplasm of other female genital organs: Secondary | ICD-10-CM | POA: Diagnosis not present

## 2018-10-10 DIAGNOSIS — I482 Chronic atrial fibrillation, unspecified: Secondary | ICD-10-CM | POA: Insufficient documentation

## 2018-10-10 DIAGNOSIS — J383 Other diseases of vocal cords: Secondary | ICD-10-CM | POA: Diagnosis not present

## 2018-10-10 HISTORY — DX: Presence of spectacles and contact lenses: Z97.3

## 2018-10-10 HISTORY — PX: MICROLARYNGOSCOPY: SHX5208

## 2018-10-10 HISTORY — DX: Other diseases of vocal cords: J38.3

## 2018-10-10 LAB — HEMOGLOBIN: Hemoglobin: 14.5 g/dL (ref 12.0–15.0)

## 2018-10-10 SURGERY — MICROLARYNGOSCOPY
Anesthesia: General

## 2018-10-10 MED ORDER — LACTATED RINGERS IV SOLN
INTRAVENOUS | Status: DC
  Administered 2018-10-10: 09:00:00 via INTRAVENOUS

## 2018-10-10 MED ORDER — DEXAMETHASONE SODIUM PHOSPHATE 10 MG/ML IJ SOLN
INTRAMUSCULAR | Status: DC | PRN
  Administered 2018-10-10: 10 mg via INTRAVENOUS

## 2018-10-10 MED ORDER — ROCURONIUM BROMIDE 10 MG/ML (PF) SYRINGE
PREFILLED_SYRINGE | INTRAVENOUS | Status: DC | PRN
  Administered 2018-10-10: 50 mg via INTRAVENOUS

## 2018-10-10 MED ORDER — DILTIAZEM HCL ER COATED BEADS 120 MG PO CP24
120.0000 mg | ORAL_CAPSULE | ORAL | Status: AC
  Administered 2018-10-10: 09:00:00 120 mg via ORAL
  Filled 2018-10-10: qty 1

## 2018-10-10 MED ORDER — ONDANSETRON HCL 4 MG/2ML IJ SOLN
INTRAMUSCULAR | Status: AC
  Filled 2018-10-10: qty 2

## 2018-10-10 MED ORDER — ROCURONIUM BROMIDE 10 MG/ML (PF) SYRINGE
PREFILLED_SYRINGE | INTRAVENOUS | Status: AC
  Filled 2018-10-10: qty 10

## 2018-10-10 MED ORDER — SCOPOLAMINE 1 MG/3DAYS TD PT72
MEDICATED_PATCH | TRANSDERMAL | Status: AC
  Administered 2018-10-10: 08:00:00 1.5 mg via TRANSDERMAL
  Filled 2018-10-10: qty 1

## 2018-10-10 MED ORDER — MIDAZOLAM HCL 2 MG/2ML IJ SOLN
INTRAMUSCULAR | Status: AC
  Filled 2018-10-10: qty 2

## 2018-10-10 MED ORDER — SCOPOLAMINE 1 MG/3DAYS TD PT72
1.0000 | MEDICATED_PATCH | Freq: Once | TRANSDERMAL | Status: DC
  Administered 2018-10-10: 08:00:00 1.5 mg via TRANSDERMAL

## 2018-10-10 MED ORDER — LIDOCAINE 2% (20 MG/ML) 5 ML SYRINGE
INTRAMUSCULAR | Status: DC | PRN
  Administered 2018-10-10: 30 mg via INTRAVENOUS

## 2018-10-10 MED ORDER — MIDAZOLAM HCL 2 MG/2ML IJ SOLN: 1.0000 mg | INTRAMUSCULAR | Status: DC | PRN

## 2018-10-10 MED ORDER — FENTANYL CITRATE (PF) 100 MCG/2ML IJ SOLN: 25.0000 ug | INTRAMUSCULAR | Status: DC | PRN

## 2018-10-10 MED ORDER — FENTANYL CITRATE (PF) 100 MCG/2ML IJ SOLN
INTRAMUSCULAR | Status: DC | PRN
  Administered 2018-10-10 (×2): 50 ug via INTRAVENOUS

## 2018-10-10 MED ORDER — EPINEPHRINE HCL (NASAL) 0.1 % NA SOLN
NASAL | Status: DC | PRN
  Administered 2018-10-10: 1 [drp] via TOPICAL

## 2018-10-10 MED ORDER — FENTANYL CITRATE (PF) 250 MCG/5ML IJ SOLN
INTRAMUSCULAR | Status: AC
  Filled 2018-10-10: qty 5

## 2018-10-10 MED ORDER — EPINEPHRINE PF 1 MG/ML IJ SOLN
INTRAMUSCULAR | Status: AC
  Filled 2018-10-10: qty 1

## 2018-10-10 MED ORDER — PROPOFOL 10 MG/ML IV BOLUS
INTRAVENOUS | Status: DC | PRN
  Administered 2018-10-10: 20 mg via INTRAVENOUS
  Administered 2018-10-10: 100 mg via INTRAVENOUS

## 2018-10-10 MED ORDER — LIDOCAINE 2% (20 MG/ML) 5 ML SYRINGE
INTRAMUSCULAR | Status: AC
  Filled 2018-10-10: qty 5

## 2018-10-10 MED ORDER — ONDANSETRON HCL 4 MG/2ML IJ SOLN
INTRAMUSCULAR | Status: DC | PRN
  Administered 2018-10-10: 4 mg via INTRAVENOUS

## 2018-10-10 MED ORDER — PROPOFOL 10 MG/ML IV BOLUS
INTRAVENOUS | Status: AC
  Filled 2018-10-10: qty 20

## 2018-10-10 MED ORDER — DEXAMETHASONE SODIUM PHOSPHATE 10 MG/ML IJ SOLN
INTRAMUSCULAR | Status: AC
  Filled 2018-10-10: qty 1

## 2018-10-10 MED ORDER — FENTANYL CITRATE (PF) 100 MCG/2ML IJ SOLN: 50.0000 ug | INTRAMUSCULAR | Status: DC | PRN

## 2018-10-10 MED ORDER — LABETALOL HCL 5 MG/ML IV SOLN
INTRAVENOUS | Status: DC | PRN
  Administered 2018-10-10: 5 mg via INTRAVENOUS

## 2018-10-10 MED ORDER — MIDAZOLAM HCL 5 MG/5ML IJ SOLN
INTRAMUSCULAR | Status: DC | PRN
  Administered 2018-10-10: 1 mg via INTRAVENOUS

## 2018-10-10 SURGICAL SUPPLY — 23 items
BLADE SURG 15 STRL LF DISP TIS (BLADE) IMPLANT
BLADE SURG 15 STRL SS (BLADE)
CANISTER SUCT 3000ML PPV (MISCELLANEOUS) ×2 IMPLANT
CONT SPEC 4OZ CLIKSEAL STRL BL (MISCELLANEOUS) IMPLANT
COVER BACK TABLE 60X90IN (DRAPES) ×2 IMPLANT
COVER WAND RF STERILE (DRAPES) ×2 IMPLANT
DRAPE HALF SHEET 40X57 (DRAPES) ×2 IMPLANT
GAUZE SPONGE 4X4 12PLY STRL (GAUZE/BANDAGES/DRESSINGS) ×2 IMPLANT
GLOVE BIO SURGEON STRL SZ7.5 (GLOVE) ×2 IMPLANT
GOWN STRL REUS W/ TWL LRG LVL3 (GOWN DISPOSABLE) ×2 IMPLANT
GOWN STRL REUS W/TWL LRG LVL3 (GOWN DISPOSABLE) ×2
KIT TURNOVER KIT B (KITS) ×2 IMPLANT
MARKER SKIN DUAL TIP RULER LAB (MISCELLANEOUS) IMPLANT
NEEDLE HYPO 25GX1X1/2 BEV (NEEDLE) IMPLANT
NS IRRIG 1000ML POUR BTL (IV SOLUTION) ×2 IMPLANT
PAD ARMBOARD 7.5X6 YLW CONV (MISCELLANEOUS) ×4 IMPLANT
PATTIES SURGICAL .5 X1 (DISPOSABLE) IMPLANT
SOL ANTI FOG 6CC (MISCELLANEOUS) IMPLANT
SOLUTION ANTI FOG 6CC (MISCELLANEOUS)
SURGILUBE 2OZ TUBE FLIPTOP (MISCELLANEOUS) ×2 IMPLANT
TOWEL GREEN STERILE FF (TOWEL DISPOSABLE) ×4 IMPLANT
TUBE CONNECTING 12X1/4 (SUCTIONS) ×2 IMPLANT
WATER STERILE IRR 1000ML POUR (IV SOLUTION) ×2 IMPLANT

## 2018-10-10 NOTE — Op Note (Signed)
DATE OF PROCEDURE:  10/10/2018                              OPERATIVE REPORT  SURGEON:  Leta Baptist, MD  PREOPERATIVE DIAGNOSES: 1. Chronic hoarseness 2. Left vocal cord mass  POSTOPERATIVE DIAGNOSES: 1. Chronic hoarseness 2. Left vocal cord mass  PROCEDURE PERFORMED: MicroDirect laryngoscopy with excision of left vocal cord mass.  ANESTHESIA:  General endotracheal tube anesthesia.  COMPLICATIONS:  None.  ESTIMATED BLOOD LOSS:  Minimal.  INDICATION FOR PROCEDURE:  Tracy Bowman is a 85 y.o. female with a history of chronic hoarseness.  On examination, the patient was noted to have a fungating mass on the left vocal cord. Based on the laryngoscopy findings, the decision was made for the patient to undergo the above-stated procedure. Likelihood of success in reducing symptoms was also discussed.  The risks, benefits, alternatives, and details of the procedure were discussed with the patient.  Questions were invited and answered.  Informed consent was obtained.  DESCRIPTION:  The patient was taken to the operating room and placed supine on the operating table.  General endotracheal tube anesthesia was administered by the anesthesiologist.  The patient was positioned and prepped and draped in a standard fashion for direct laryngoscopy.  An anterior commissure laryngoscope was used for the examination.  The laryngoscope was inserted via the oral cavity into the pharynx.  Examination of the epiglottis, aryepiglottic folds, vallecula, and piriform sinuses were all normal.  Examination of the vocal cords revealed a fungating 3 mm soft tissue mass on the left anterior vocal cord.  The laryngocope was suspended with the Lewy suspender.  An operating microscope was brought into the field.  Under the operating microscope, the left vocal cord mass was excised using a combination of laryngeal scissors and laryngeal forceps.  The specimen was sent to the pathology department for permanent histologic  identification.  The laryngoscope was withdrawn.  The care of the patient was turned over to the anesthesiologist.  The patient was awakened from anesthesia without difficulty.  The patient was extubated and transferred to the recovery room in good condition.  OPERATIVE FINDINGS: The patient has a 3 mm left anterior vocal cord mass.  SPECIMEN: Left vocal cord mass.  FOLLOWUP CARE:  The patient will be discharged home once awake and alert.   The patient will follow up in my office in approximately 1 week.  Tracy Bowman 10/10/2018 10:41 AM

## 2018-10-10 NOTE — Anesthesia Postprocedure Evaluation (Signed)
Anesthesia Post Note  Patient: Tracy Bowman  Procedure(s) Performed: MICRO DIRECT LARYNGOSCOPY WITH EXCISION OF VOCAL CORD MASS (N/A )     Patient location during evaluation: PACU Anesthesia Type: General Level of consciousness: awake and alert, patient cooperative and oriented Pain management: pain level controlled Vital Signs Assessment: post-procedure vital signs reviewed and stable Respiratory status: spontaneous breathing, nonlabored ventilation and respiratory function stable Cardiovascular status: blood pressure returned to baseline and stable Postop Assessment: no apparent nausea or vomiting Anesthetic complications: no    Last Vitals:  Vitals:   10/10/18 1136 10/10/18 1151  BP: 133/73 139/77  Pulse: 63 (!) 55  Resp: (!) 25 19  Temp:    SpO2: 100% 94%    Last Pain:  Vitals:   10/10/18 1151  TempSrc:   PainSc: 0-No pain                 Zakyra Kukuk,E. Daya Dutt

## 2018-10-10 NOTE — H&P (Signed)
Cc: Hoarseness  HPI: The patient is a 85 y/o female who presents today with her husband for evaluation of hoarseness. The patient is seen in consultation requested by Dr. Stoney Bang. The patient noted onset of hoarseness over 2 months ago. She was treated for allergies with no improvement in her voice. The patient denies pain or difficulty swallowing. She has not noted any post nasal drainage or reflux symptoms. Tobacco use is denied. Previous ENT surgery is denied.   The patient's review of systems (constitutional, eyes, ENT, cardiovascular, respiratory, GI, musculoskeletal, skin, neurologic, psychiatric, endocrine, hematologic, allergic) is noted in the ROS questionnaire.  It is reviewed with the patient and her husband.   Family health history: Cancer, CHF, dementia.  Major events: Hysterectomy, cataracts extraction, right hip fracture/surgery.  Ongoing medical problems: Atrial fibrillation, CHF, depression. Social history: The patient is married. She denies the use of tobacco, alcohol or illegal drugs.   Exam: General: Communicates without difficulty, well nourished, no acute distress. Severe hoarseness. Head: Normocephalic, no evidence injury, no tenderness, facial buttresses intact without stepoff. Eyes: PERRL, EOMI.  No scleral icterus, conjunctivae clear. Ears: External auditory canals clear bilaterally.  There is no edema or erythema.  Tympanic membrane is within normal limits bilaterally. Nose: Normal skin and external support.  Anterior rhinoscopy reveals healthy pink mucosa over the septum and turbinates.  No lesions or polyps were seen. Oral cavity: Lips without lesions, oral mucosa moist, no masses or lesions seen. Indirect  mirror laryngoscopy could not be tolerated. Pharynx: Clear, no erythema. Neck: Supple, full range of motion, no lymphadenopathy, no masses palpable. Salivary: Parotid and submandibular glands without mass. Neuro:  CN 2-12 grossly intact. Gait normal. Vestibular: No  nystagmus at any point of gaze.   Procedure:  Flexible Fiberoptic Laryngoscopy -- Risks, benefits, and alternatives of flexible endoscopy were explained to the patient.  Specific mention was made of the risk of throat numbness with difficulty swallowing, possible bleeding from the nose and mouth, and pain from the procedure.  The patient gave oral consent to proceed.  The nasal cavities were decongested and anesthetised with a combination of oxymetazoline and 4% lidocaine solution.  The flexible scope was inserted into the right nasal cavity and advanced towards the nasopharynx.  Visualized mucosa over the turbinates and septum were as described above.  The nasopharynx was clear.  Oropharyngeal walls were symmetric and mobile without lesion, mass, or edema.  Hypopharynx was also without  lesion or edema.  Larynx was mobile without lesions. Supraglottic structures were free of edema, mass, and asymmetry.  True vocal folds were white with a 37mm mass noted on the left with associated glottic gap.  Base of tongue was within normal limits.  The patient tolerated the procedure well.   Assessment 1. The patient is noted to have a 3 mm left vocal cord lesion, which results in a glottic gap.  No other suspicious mass or lesion is noted on today's fiberoptic laryngoscopy exam.  Plan  1. The physical exam and laryngoscopy findings are reviewed with the patient and her husband.  2. Recommend micro direct laryngoscopy with excision of her left vocal cord mass. The risks, benefits, alternatives, and details of the procedure are extensively reviewed with the patient. Questions are invited and answered. 3. The patient is interested in proceeding with the procedure.  We will schedule the procedure in accordance with the family schedule.

## 2018-10-10 NOTE — Transfer of Care (Signed)
Immediate Anesthesia Transfer of Care Note  Patient: Tracy Bowman  Procedure(s) Performed: MICRO DIRECT LARYNGOSCOPY WITH EXCISION OF VOCAL CORD MASS (N/A )  Patient Location: PACU  Anesthesia Type:General  Level of Consciousness: drowsy and patient cooperative  Airway & Oxygen Therapy: Patient Spontanous Breathing and Patient connected to nasal cannula oxygen  Post-op Assessment: Report given to RN, Post -op Vital signs reviewed and stable and Patient moving all extremities  Post vital signs: Reviewed and stable  Last Vitals:  Vitals Value Taken Time  BP 149/88 10/10/18 1051  Temp    Pulse 75 10/10/18 1052  Resp 21 10/10/18 1052  SpO2 94 % 10/10/18 1052  Vitals shown include unvalidated device data.  Last Pain:  Vitals:   10/10/18 0811  TempSrc:   PainSc: 0-No pain      Patients Stated Pain Goal: 3 ([id number])  Complications: No apparent anesthesia complications

## 2018-10-10 NOTE — Anesthesia Procedure Notes (Signed)
Procedure Name: Intubation Date/Time: 10/10/2018 10:10 AM Performed by: Moshe Salisbury, CRNA Pre-anesthesia Checklist: Patient identified, Emergency Drugs available, Suction available and Patient being monitored Patient Re-evaluated:Patient Re-evaluated prior to induction Oxygen Delivery Method: Circle System Utilized Preoxygenation: Pre-oxygenation with 100% oxygen Induction Type: IV induction Ventilation: Mask ventilation without difficulty Laryngoscope Size: Mac and 3 Grade View: Grade III Tube type: Oral Tube size: 6.0 mm Number of attempts: 1 Airway Equipment and Method: Stylet Placement Confirmation: ETT inserted through vocal cords under direct vision,  positive ETCO2 and breath sounds checked- equal and bilateral Secured at: 21 cm Tube secured with: Tape Dental Injury: Teeth and Oropharynx as per pre-operative assessment

## 2018-10-11 ENCOUNTER — Encounter (HOSPITAL_COMMUNITY): Payer: Self-pay | Admitting: Otolaryngology

## 2018-10-12 LAB — SURGICAL PATHOLOGY

## 2018-10-19 DIAGNOSIS — I10 Essential (primary) hypertension: Secondary | ICD-10-CM | POA: Diagnosis not present

## 2018-10-19 DIAGNOSIS — E038 Other specified hypothyroidism: Secondary | ICD-10-CM | POA: Diagnosis not present

## 2018-10-19 DIAGNOSIS — F320 Major depressive disorder, single episode, mild: Secondary | ICD-10-CM | POA: Diagnosis not present

## 2018-10-22 ENCOUNTER — Other Ambulatory Visit: Payer: Self-pay

## 2018-10-22 ENCOUNTER — Ambulatory Visit (INDEPENDENT_AMBULATORY_CARE_PROVIDER_SITE_OTHER): Payer: Medicare Other | Admitting: Otolaryngology

## 2018-10-22 DIAGNOSIS — C320 Malignant neoplasm of glottis: Secondary | ICD-10-CM | POA: Diagnosis not present

## 2018-10-25 ENCOUNTER — Encounter (HOSPITAL_COMMUNITY): Payer: Self-pay | Admitting: *Deleted

## 2018-10-25 ENCOUNTER — Other Ambulatory Visit: Payer: Self-pay

## 2018-10-25 DIAGNOSIS — Z23 Encounter for immunization: Secondary | ICD-10-CM | POA: Diagnosis not present

## 2018-10-26 ENCOUNTER — Inpatient Hospital Stay (HOSPITAL_COMMUNITY): Payer: Medicare Other | Attending: Hematology | Admitting: Hematology

## 2018-10-26 ENCOUNTER — Encounter (HOSPITAL_COMMUNITY): Payer: Self-pay | Admitting: Hematology

## 2018-10-26 ENCOUNTER — Inpatient Hospital Stay (HOSPITAL_COMMUNITY): Payer: Medicare Other

## 2018-10-26 DIAGNOSIS — C329 Malignant neoplasm of larynx, unspecified: Secondary | ICD-10-CM

## 2018-10-26 LAB — CBC WITH DIFFERENTIAL/PLATELET
Abs Immature Granulocytes: 0.03 10*3/uL (ref 0.00–0.07)
Basophils Absolute: 0 10*3/uL (ref 0.0–0.1)
Basophils Relative: 1 %
Eosinophils Absolute: 0.1 10*3/uL (ref 0.0–0.5)
Eosinophils Relative: 2 %
HCT: 46 % (ref 36.0–46.0)
Hemoglobin: 14.7 g/dL (ref 12.0–15.0)
Immature Granulocytes: 1 %
Lymphocytes Relative: 20 %
Lymphs Abs: 1.1 10*3/uL (ref 0.7–4.0)
MCH: 31.7 pg (ref 26.0–34.0)
MCHC: 32 g/dL (ref 30.0–36.0)
MCV: 99.4 fL (ref 80.0–100.0)
Monocytes Absolute: 0.6 10*3/uL (ref 0.1–1.0)
Monocytes Relative: 10 %
Neutro Abs: 4 10*3/uL (ref 1.7–7.7)
Neutrophils Relative %: 66 %
Platelets: 155 10*3/uL (ref 150–400)
RBC: 4.63 MIL/uL (ref 3.87–5.11)
RDW: 12.9 % (ref 11.5–15.5)
WBC: 5.8 10*3/uL (ref 4.0–10.5)
nRBC: 0 % (ref 0.0–0.2)

## 2018-10-26 LAB — COMPREHENSIVE METABOLIC PANEL
ALT: 22 U/L (ref 0–44)
AST: 25 U/L (ref 15–41)
Albumin: 4.3 g/dL (ref 3.5–5.0)
Alkaline Phosphatase: 63 U/L (ref 38–126)
Anion gap: 10 (ref 5–15)
BUN: 21 mg/dL (ref 8–23)
CO2: 25 mmol/L (ref 22–32)
Calcium: 9 mg/dL (ref 8.9–10.3)
Chloride: 104 mmol/L (ref 98–111)
Creatinine, Ser: 0.66 mg/dL (ref 0.44–1.00)
GFR calc Af Amer: >60 mL/min (ref >60)
GFR calc non Af Amer: >60 mL/min (ref >60)
Glucose, Bld: 123 mg/dL — ABNORMAL HIGH (ref 70–99)
Potassium: 4.1 mmol/L (ref 3.5–5.1)
Sodium: 139 mmol/L (ref 135–145)
Total Bilirubin: 1.6 mg/dL — ABNORMAL HIGH (ref 0.3–1.2)
Total Protein: 7.4 g/dL (ref 6.5–8.1)

## 2018-10-26 NOTE — Patient Instructions (Signed)
[place] at [facility] Discharge Instructions  You were seen today by Dr. Delton Coombes. He went over your history, family history and how you've been feeling lately. He will schedule you for lab work and a PET scan to further evaluate your cancer. He will see you back after your scan for follow up.   Thank you for choosing [place] at [facility] to provide your oncology and hematology care.  To afford each patient quality time with our provider, please arrive at least 15 minutes before your scheduled appointment time.   If you have a lab appointment with the [facility] please come in thru the  Main Entrance and check in at the main information desk  You need to re-schedule your appointment should you arrive 10 or more minutes late.  We strive to give you quality time with our providers, and arriving late affects you and other patients whose appointments are after yours.  Also, if you no show three or more times for appointments you may be dismissed from the clinic at the providers discretion.     Again, thank you for choosing [facility].  Our hope is that these requests will decrease the amount of time that you wait before being seen by our physicians.       _____________________________________________________________  Should you have questions after your visit to [facility], please contact our office at [phone] between the hours of 8:00 a.m. and 4:30 p.m.  Voicemails left after 4:00 p.m. will not be returned until the following business day.  For prescription refill requests, have your pharmacy contact our office and allow 72 hours.    Cancer Center Support Programs:   > Cancer Support Group  2nd Tuesday of the month 1pm-2pm, Journey Room

## 2018-10-26 NOTE — Progress Notes (Signed)
[facility] NOTE  Patient Care Team: Neale Burly, MD as PCP - General (Internal Medicine)  CHIEF COMPLAINTS/PURPOSE OF CONSULTATION:  Squamous cell carcinoma left vocal cord.  HISTORY OF PRESENTING ILLNESS:  Tracy Bowman 85 y.o. 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  She reported having hoarseness for the past few months.  She was subsequently evaluated by Dr. Benjamine Mola on 09/03/2018.  She underwent a fiberoptic laryngoscopy exam which showed 3 mm mass noted on the left vocal cord with associated glottic gap.  Rest of the examination was normal.  She was never smoker.  She worked at [employer] for 43 years.  On 10/10/2018 she underwent MicroDirect laryngoscopy with excision of the left vocal cord mass.  Since the procedure she reported improvement in her hoarseness.  She is accompanied by her husband today.  She reportedly had a history of falls due to vertigo and uses a walker.  She recently sustained lower extremity fractures from falls.  She also has a history of stage IIIc right fallopian serosal cancer, diagnosed in 2016, underwent debulking surgery followed by 6 cycles of chemotherapy with carboplatin and paclitaxel.  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  Family history significant for father with colon cancer and 2 sisters with breast cancer and one brother with lung cancer.  She reports appetite 100% and energy levels 75%.  MEDICAL HISTORY:  Past Medical History:  Diagnosis Date  . Anxiety neurosis   . Arthritis    "a touch in my hands" (10/09/2017)  . 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 Chronic atrial fibrillation ([place])   . Chronic diastolic (congestive) heart failure ([place])   . Depression with anxiety   . Hyperlipidemia   . Hypothyroidism   . Vocal cord mass   . Wears glasses      SURGICAL HISTORY: Past Surgical History:  Procedure Laterality Date  . CATARACT EXTRACTION W/ INTRAOCULAR LENS  IMPLANT, BILATERAL Bilateral   . COLECTOMY Left 12/03/2013   hemicolectomy/notes 12/03/2013  . COLONOSCOPY    . FRACTURE SURGERY    . HIP FRACTURE SURGERY Right 2018; ~ 08/2017  . MICROLARYNGOSCOPY N/A 10/10/2018   Procedure: MICRO DIRECT LARYNGOSCOPY WITH EXCISION OF VOCAL CORD MASS;  Surgeon: Leta Baptist, MD;  Location: [place];  Service: ENT;  Laterality: N/A;  . MULTIPLE TOOTH EXTRACTIONS    . ORIF FEMUR FRACTURE Right 10/07/2017   Procedure: OPEN REDUCTION INTERNAL FIXATION (ORIF) DISTAL FEMUR FRACTURE;  Surgeon: Altamese Cashton, MD;  Location: [place];  Service: Orthopedics;  Laterality: Right;  . TOTAL ABDOMINAL HYSTERECTOMY  12/03/2013   Archie Endo 12/03/2013    SOCIAL HISTORY: Social History   Socioeconomic History  . Marital status: Married    Spouse name: Francee Piccolo  . Number of children: Not on file  . Years of education: Not on file  . Highest education level: Not on file  Occupational History  . Not on file  Social Needs  . Financial resource strain: Not very hard  . Food insecurity    Worry: Never true    Inability: Never true  . Transportation needs    Medical: No    Non-medical: No  Tobacco Use  . Smoking status: Never Smoker  . Smokeless tobacco: Never Used  Substance and Sexual Activity  . Alcohol use: Never    Alcohol/week:  0.0 standard drinks    Frequency: Never  . Drug use: Never  . Sexual activity: Not Currently  Lifestyle  . Physical activity    Days per week: 0 days    Minutes per session: 0 min  . Stress: Only a little  Relationships  . Social Herbalist on phone: Once a week    Gets together: Once a week    Attends religious service: 1 to 4 times per year    Active member of club or organization: No    Attends meetings of clubs or organizations: Never    Relationship status: Married  . Intimate partner violence    Fear of  current or ex partner: No    Emotionally abused: No    Physically abused: No    Forced sexual activity: No  Other Topics Concern  . Not on file  Social History Narrative  . Not on file    FAMILY HISTORY: Family History  Problem Relation Age of Onset  . Colon cancer Father   . Congestive Heart Failure Mother   . Dementia Brother   . Breast cancer Sister   . Breast cancer Sister   . Healthy Sister     ALLERGIES:  is allergic to morphine and oxycodone.  MEDICATIONS:  Current Outpatient Medications  Medication Sig Dispense Refill  . alendronate (FOSAMAX) 70 MG tablet Take 70 mg by mouth once a week.   0  . aspirin EC 81 MG EC tablet Take 1 tablet (81 mg total) by mouth daily.    . Cholecalciferol (VITAMIN D) 50 MCG (2000 UT) tablet Take by mouth.    . CVS VITAMIN B12 1000 MCG tablet Take 1,000 mcg by mouth daily.  0  . diltiazem (CARDIZEM) 120 MG tablet Take 120 mg by mouth daily.    Marland Kitchen levothyroxine (SYNTHROID, LEVOTHROID) 25 MCG tablet Take 25 mcg by mouth daily before breakfast.     . Magnesium 200 MG TABS Take 1 tablet by mouth daily.  0  . midodrine (PROAMATINE) 2.5 MG tablet Take 2.5 mg by mouth 2 (two) times daily.  2  . mirtazapine (REMERON) 15 MG tablet Take 15 mg by mouth at bedtime.     . Multiple Vitamins-Minerals (CVS ONE DAILY ESSENTIAL) TABS Take 1 tablet by mouth daily.  0  . Omega-3 Fatty Acids (FISH OIL) 1000 MG CAPS Take 1,000 mg by mouth 2 (two) times daily.     . sertraline (ZOLOFT) 50 MG tablet Take 50 mg by mouth daily.  0  . acetaminophen (TYLENOL) 325 MG tablet Take 2 tablets (650 mg total) by mouth every 6 (six) hours as needed. (Patient not taking: Reported on 10/25/2018)    . ALPRAZolam (XANAX) 0.5 MG tablet Take 1 tablet (0.5 mg total) by mouth at bedtime as needed. (Patient not taking: Reported on 10/26/2018) 8 tablet 0  . furosemide (LASIX) 20 MG tablet Take 20 mg by mouth daily.      No current facility-administered medications for this visit.      REVIEW OF SYSTEMS:   Constitutional: Denies fevers, chills or abnormal night sweats Eyes: Denies blurriness of vision, double vision or watery eyes Ears, nose, mouth, throat, and face: Denies mucositis or sore throat Respiratory: Denies cough, dyspnea or wheezes Cardiovascular: Denies palpitation, chest discomfort or lower extremity swelling Gastrointestinal:  Denies nausea, heartburn or change in bowel habits Skin: Denies abnormal skin rashes Lymphatics: Denies new lymphadenopathy or easy bruising Neurological: Positive for numbness in the  hands. Behavioral/Psych: Mood is stable, no new changes  All other systems were reviewed with the patient and are negative.  PHYSICAL EXAMINATION: ECOG PERFORMANCE STATUS: 1 - Symptomatic but completely ambulatory  Vitals:   10/26/18 1136  BP: (!) 141/70  Pulse: 60  Resp: 18  Temp: 97.9 F (36.6 C)  SpO2: 96%   Filed Weights   10/26/18 1136  Weight: 173 lb 1.6 oz (78.5 kg)    GENERAL:alert, no distress and comfortable SKIN: skin color, texture, turgor are normal, no rashes or significant lesions EYES: normal, conjunctiva are pink and non-injected, sclera clear OROPHARYNX:no exudate, no erythema and lips, buccal mucosa, and tongue normal  NECK: supple, thyroid normal size, non-tender, without nodularity LYMPH:  no palpable lymphadenopathy in the cervical, axillary or inguinal LUNGS: clear to auscultation and percussion with normal breathing effort HEART: regular rate & rhythm and no murmurs.  Left ankle swelling present. ABDOMEN:abdomen soft, non-tender and normal bowel sounds Musculoskeletal:no cyanosis of digits and no clubbing  PSYCH: alert & oriented x 3 with fluent speech NEURO: no focal motor/sensory deficits  LABORATORY DATA:  I have reviewed the data as listed Lab Results  Component Value Date   WBC 5.8 10/26/2018   HGB 14.7 10/26/2018   HCT 46.0 10/26/2018   MCV 99.4 10/26/2018   PLT 155 10/26/2018     Chemistry       Component Value Date/Time   NA 139 10/26/2018 1225   K 4.1 10/26/2018 1225   CL 104 10/26/2018 1225   CO2 25 10/26/2018 1225   BUN 21 10/26/2018 1225   CREATININE 0.66 10/26/2018 1225      Component Value Date/Time   CALCIUM 9.0 10/26/2018 1225   ALKPHOS 63 10/26/2018 1225   AST 25 10/26/2018 1225   ALT 22 10/26/2018 1225   BILITOT 1.6 (H) 10/26/2018 1225       RADIOGRAPHIC STUDIES: I have personally reviewed the radiological images as listed and agreed with the findings in the report.  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) 1.  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 -She was evaluated by Dr. Benjamine Mola on 09/03/2018 and a fiberoptic laryngoscopy exam showed a 3 mm mass noted on the left vocal cord with associated glottic gap.  Rest of the exam was within normal limits. -On 10/10/2018 she underwent MicroDirect laryngoscopy with excision of the left vocal cord mass. -Since the procedure, her hoarseness has improved.  She denied any weight loss. -She was a never smoker.  She is accompanied by her husband.  She walks with help of walker due to falls from vertigo. -We reviewed the pathology report dated 10/10/2018 consistent with squamous cell carcinoma. -I have recommended a PET CT scan for further staging.  If there is no evidence of metastatic disease, she will be referred for XRT.  2.  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 -She has completed 6 cycles of adjuvant carboplatin and paclitaxel given in [place]. -She has some residual tingling or numbness in the hands from chemotherapy.  3.  Family history: -Father had colon cancer. -2 sisters had breast cancer.  One brother had lung cancer.  Orders Placed This Encounter  Procedures  . NM PET Image Initial (PI) Skull Base To Thigh    Standing Status:   Future    Standing  Expiration Date:   10/26/2019    Order  Specific Question:   ** REASON FOR EXAM (FREE TEXT)    Answer:   vocal cord cancer    Order Specific Question:   If indicated for the ordered procedure, I authorize the administration of a radiopharmaceutical per Radiology protocol    Answer:   Yes    Order Specific Question:   Preferred imaging location?    Answer:   [facility]    Order Specific Question:   Radiology Contrast Protocol - do NOT remove file path    Answer:   [email or web address]  . CBC with Differential/Platelet    Standing Status:   Future    Number of Occurrences:   1    Standing Expiration Date:   10/26/2019  . Comprehensive metabolic panel    Standing Status:   Future    Number of Occurrences:   1    Standing Expiration Date:   10/26/2019    All questions were answered. The patient knows to call the clinic with any problems, questions or concerns.      Derek Jack, MD 10/27/2018 4:39 PM

## 2018-10-27 NOTE — Assessment & Plan Note (Signed)
1.  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 -She was evaluated by Dr. Benjamine Mola on 09/03/2018 and a fiberoptic laryngoscopy exam showed a 3 mm mass noted on the left vocal cord with associated glottic gap.  Rest of the exam was within normal limits. -On 10/10/2018 she underwent MicroDirect laryngoscopy with excision of the left vocal cord mass. -Since the procedure, her hoarseness has improved.  She denied any weight loss. -She was a never smoker.  She is accompanied by her husband.  She walks with help of walker due to falls from vertigo. -We reviewed the pathology report dated 10/10/2018 consistent with squamous cell carcinoma. -I have recommended a PET CT scan for further staging.  If there is no evidence of metastatic disease, she will be referred for XRT.  2.  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 -She has completed 6 cycles of adjuvant carboplatin and paclitaxel given in [place]. -She has some residual tingling or numbness in the hands from chemotherapy.  3.  Family history: -Father had colon cancer. -2 sisters had breast cancer.  One brother had lung cancer.

## 2018-11-12 ENCOUNTER — Other Ambulatory Visit: Payer: Self-pay

## 2018-11-12 ENCOUNTER — Ambulatory Visit (HOSPITAL_COMMUNITY): Admission: RE | Admit: 2018-11-12 | Payer: Medicare Other | Source: Ambulatory Visit

## 2018-11-12 ENCOUNTER — Ambulatory Visit (HOSPITAL_COMMUNITY)
Admission: RE | Admit: 2018-11-12 | Discharge: 2018-11-12 | Disposition: A | Discharge status: Home / Self Care | Payer: Medicare Other | Source: Ambulatory Visit | Attending: Hematology | Admitting: Hematology

## 2018-11-12 DIAGNOSIS — C329 Malignant neoplasm of larynx, unspecified: Secondary | ICD-10-CM

## 2018-11-12 MED ORDER — FLUDEOXYGLUCOSE F - 18 (FDG) INJECTION
8.6000 | Freq: Once | INTRAVENOUS | Status: AC | PRN
  Administered 2018-11-12: 16:00:00 8.6 via INTRAVENOUS

## 2018-11-13 ENCOUNTER — Encounter (HOSPITAL_COMMUNITY): Payer: Self-pay | Admitting: Hematology

## 2018-11-13 ENCOUNTER — Inpatient Hospital Stay (HOSPITAL_COMMUNITY): Payer: Medicare Other | Attending: Hematology | Admitting: Hematology

## 2018-11-13 VITALS — BP 131/59 | HR 95 | Temp 97.7°F | Resp 16 | Wt 173.0 lb

## 2018-11-13 DIAGNOSIS — C329 Malignant neoplasm of larynx, unspecified: Secondary | ICD-10-CM | POA: Diagnosis not present

## 2018-11-13 DIAGNOSIS — Z9221 Personal history of antineoplastic chemotherapy: Secondary | ICD-10-CM | POA: Insufficient documentation

## 2018-11-13 NOTE — Progress Notes (Signed)
Tracy Bowman,  [id number]   CLINIC:  Medical Oncology/Hematology  PCP:  Neale Burly, MD [place] [id number] [phone]   REASON FOR VISIT:  Follow-up for left vocal cord cancer.  CURRENT THERAPY: Observation.  INTERVAL HISTORY:  Tracy Bowman 85 y.o. 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  She reported some improvement in the hoarseness after laryngeal surgery but her voice has not come back to normal baseline.  Appetite is 75%.  Energy levels are 25%.  She has occasional cough which is stable.  Numbness in the hands and feet is also stable.  REVIEW OF SYSTEMS:  Review of Systems  Respiratory: Positive for cough.   Neurological: Positive for numbness.  All other systems reviewed and are negative.    PAST MEDICAL/SURGICAL HISTORY:  Past Medical History:  Diagnosis Date  . Anxiety neurosis   . Arthritis    "a touch in my hands" (10/09/2017)  . 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 Chronic atrial fibrillation ([place])   . Chronic diastolic (congestive) heart failure ([place])   . Depression with anxiety   . Hyperlipidemia   . Hypothyroidism   . Vocal cord mass   . Wears glasses    Past Surgical History:  Procedure Laterality Date  . CATARACT EXTRACTION W/ INTRAOCULAR LENS  IMPLANT, BILATERAL Bilateral   . COLECTOMY Left 12/03/2013   hemicolectomy/notes 12/03/2013  . COLONOSCOPY    . FRACTURE SURGERY    . HIP FRACTURE SURGERY Right 2018; ~ 08/2017  . MICROLARYNGOSCOPY N/A 10/10/2018   Procedure: MICRO DIRECT LARYNGOSCOPY WITH EXCISION OF VOCAL CORD MASS;  Surgeon: Leta Baptist, MD;  Location: [place];  Service: ENT;  Laterality: N/A;  . MULTIPLE TOOTH EXTRACTIONS    . ORIF FEMUR FRACTURE Right 10/07/2017   Procedure: OPEN REDUCTION INTERNAL FIXATION (ORIF) DISTAL FEMUR FRACTURE;  Surgeon: Altamese , MD;   Location: [place];  Service: Orthopedics;  Laterality: Right;  . TOTAL ABDOMINAL HYSTERECTOMY  12/03/2013   Archie Endo 12/03/2013     SOCIAL HISTORY:  Social History   Socioeconomic History  . Marital status: Married    Spouse name: Francee Piccolo  . Number of children: Not on file  . Years of education: Not on file  . Highest education level: Not on file  Occupational History  . Not on file  Social Needs  . Financial resource strain: Not very hard  . Food insecurity    Worry: Never true    Inability: Never true  . Transportation needs    Medical: No    Non-medical: No  Tobacco Use  . Smoking status: Never Smoker  . Smokeless tobacco: Never Used  Substance and Sexual Activity  . Alcohol use: Never    Alcohol/week: 0.0 standard drinks    Frequency: Never  . Drug use: Never  . Sexual activity: Not Currently  Lifestyle  . Physical activity    Days per week: 0 days    Minutes per session: 0 min  . Stress: Only a little  Relationships  . Social Herbalist on phone: Once a week    Gets together: Once a week    Attends religious service: 1 to 4 times per year    Active member of club or organization: No    Attends meetings of clubs or organizations: Never  Relationship status: Married  . Intimate partner violence    Fear of current or ex partner: No    Emotionally abused: No    Physically abused: No    Forced sexual activity: No  Other Topics Concern  . Not on file  Social History Narrative  . Not on file    FAMILY HISTORY:  Family History  Problem Relation Age of Onset  . Colon cancer Father   . Congestive Heart Failure Mother   . Dementia Brother   . Breast cancer Sister   . Breast cancer Sister   . Healthy Sister     CURRENT MEDICATIONS:  Outpatient Encounter Medications as of 11/13/2018  Medication Sig  . alendronate (FOSAMAX) 70 MG tablet Take 70 mg by mouth once a week.   Marland Kitchen aspirin EC 81 MG EC tablet Take 1 tablet (81 mg total) by mouth daily.  .  Cholecalciferol (VITAMIN D) 50 MCG (2000 UT) tablet Take by mouth.  . CVS VITAMIN B12 1000 MCG tablet Take 1,000 mcg by mouth daily.  Marland Kitchen diltiazem (CARDIZEM) 120 MG tablet Take 120 mg by mouth daily.  . furosemide (LASIX) 20 MG tablet Take 20 mg by mouth daily.   Marland Kitchen levothyroxine (SYNTHROID, LEVOTHROID) 25 MCG tablet Take 25 mcg by mouth daily before breakfast.   . Magnesium 200 MG TABS Take 1 tablet by mouth daily.  . midodrine (PROAMATINE) 2.5 MG tablet Take 2.5 mg by mouth 2 (two) times daily.  . mirtazapine (REMERON) 15 MG tablet Take 15 mg by mouth at bedtime.   . Multiple Vitamins-Minerals (CVS ONE DAILY ESSENTIAL) TABS Take 1 tablet by mouth daily.  . Omega-3 Fatty Acids (FISH OIL) 1000 MG CAPS Take 1,000 mg by mouth 2 (two) times daily.   . sertraline (ZOLOFT) 50 MG tablet Take 50 mg by mouth daily.  Marland Kitchen acetaminophen (TYLENOL) 325 MG tablet Take 2 tablets (650 mg total) by mouth every 6 (six) hours as needed. (Patient not taking: Reported on 10/25/2018)  . ALPRAZolam (XANAX) 0.5 MG tablet Take 1 tablet (0.5 mg total) by mouth at bedtime as needed. (Patient not taking: Reported on 10/26/2018)   No facility-administered encounter medications on file as of 11/13/2018.     ALLERGIES:  Allergies  Allergen Reactions  . Morphine Other (See Comments)    hallucinations   . Oxycodone Other (See Comments)    Pt does not tolerate well extreme dry mouth       PHYSICAL EXAM:  ECOG Performance status: 1  Vitals:   11/13/18 1425  BP: (!) 131/59  Pulse: 95  Resp: 16  Temp: 97.7 F (36.5 C)  SpO2: 95%   Filed Weights   11/13/18 1425  Weight: 173 lb (78.5 kg)    Physical Exam Vitals signs reviewed.  Constitutional:      Appearance: Normal appearance.  Cardiovascular:     Rate and Rhythm: Normal rate and regular rhythm.     Heart sounds: Normal heart sounds.  Pulmonary:     Effort: Pulmonary effort is normal.     Breath sounds: Normal breath sounds.  Abdominal:     General:  There is no distension.     Palpations: Abdomen is soft. There is no mass.  Skin:    General: Skin is warm.  Neurological:     General: No focal deficit present.     Mental Status: She is alert and oriented to person, place, and time.  Psychiatric:        Mood and  Affect: Mood normal.        Behavior: Behavior normal.      LABORATORY DATA:  I have reviewed the labs as listed.  CBC    Component Value Date/Time   WBC 5.8 10/26/2018 1225   RBC 4.63 10/26/2018 1225   HGB 14.7 10/26/2018 1225   HCT 46.0 10/26/2018 1225   PLT 155 10/26/2018 1225   MCV 99.4 10/26/2018 1225   MCH 31.7 10/26/2018 1225   MCHC 32.0 10/26/2018 1225   RDW 12.9 10/26/2018 1225   LYMPHSABS 1.1 10/26/2018 1225   MONOABS 0.6 10/26/2018 1225   EOSABS 0.1 10/26/2018 1225   BASOSABS 0.0 10/26/2018 1225   CMP Latest Ref Rng & Units 10/26/2018 10/02/2018 10/10/2017  Glucose 70 - 99 mg/dL 123(H) 193(H) 136(H)  BUN 8 - 23 mg/dL 21 16 19   Creatinine 0.44 - 1.00 mg/dL 0.66 0.75 0.54  Sodium 135 - 145 mmol/L 139 140 140  Potassium 3.5 - 5.1 mmol/L 4.1 3.9 4.0  Chloride 98 - 111 mmol/L 104 104 105  CO2 22 - 32 mmol/L 25 27 28   Calcium 8.9 - 10.3 mg/dL 9.0 8.8(L) 7.7(L)  Total Protein 6.5 - 8.1 g/dL 7.4 - -  Total Bilirubin 0.3 - 1.2 mg/dL 1.6(H) - -  Alkaline Phos 38 - 126 U/L 63 - -  AST 15 - 41 U/L 25 - -  ALT 0 - 44 U/L 22 - -       DIAGNOSTIC IMAGING:  I have independently reviewed the scans and discussed with the patient.     ASSESSMENT & PLAN:   Laryngeal squamous cell carcinoma ([place]) 1.  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 Melene Plan on 09/03/2018 showed 3 mm mass noted on the left vocal cord with associated glottic gap.  Rest of the exam was normal. -MicroDirect laryngoscopy with excision of the left vocal cord mass on 10/10/2018. -Pathology report is not 100% clear if it is in situ or invasive squamous cell carcinoma.  I have reached out to pathologist who  is aware and will call me back tomorrow. -We reviewed results of the PET CT scan from 11/12/2018 which showed mild focal activity in the posterior aspect of the larynx which is nonspecific.  Asymmetrical metabolic activity in the posterior left oropharynx with some tissue thickening.  No evidence of metastatic adenopathy in the neck.  No distant metastatic disease. -I have also called and talked to [place].  If the pathology is in situ confirmed, she can be managed with observation and repeat laryngoscopy.  However if it is squamous cell carcinoma, with positive margins, she might benefit from radiation. -She will be referred to [place].  I will see her back in 3 months for follow-up.  2.  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 -She completed 6 cycles of adjuvant carboplatin and paclitaxel given in [place]. -She has some residual tingling/numbness in the hands from chemotherapy.  3.  Family history: -Father had colon cancer. -2 sisters had breast cancer.  One brother had lung cancer.      Orders placed this encounter:  Orders Placed This Encounter  Procedures  . TSH      Derek Jack, [place] [phone]

## 2018-11-13 NOTE — Patient Instructions (Addendum)
[place] at [facility] Discharge Instructions  You were seen today by Dr. Delton Coombes. He went over your recent test and scan results. Dr. Raliegh Ip does not think you need chemotherapy. He will refer you to radiation in [facility] for evaluation. He will see you back in 3 months for labs and follow up.   Thank you for choosing [place] at [facility] to provide your oncology and hematology care.  To afford each patient quality time with our provider, please arrive at least 15 minutes before your scheduled appointment time.   If you have a lab appointment with the [facility] please come in thru the  Main Entrance and check in at the main information desk  You need to re-schedule your appointment should you arrive 10 or more minutes late.  We strive to give you quality time with our providers, and arriving late affects you and other patients whose appointments are after yours.  Also, if you no show three or more times for appointments you may be dismissed from the clinic at the providers discretion.     Again, thank you for choosing [facility].  Our hope is that these requests will decrease the amount of time that you wait before being seen by our physicians.       _____________________________________________________________  Should you have questions after your visit to [facility], please contact our office at [phone] between the hours of 8:00 a.m. and 4:30 p.m.  Voicemails left after 4:00 p.m. will not be returned until the following business day.  For prescription refill requests, have your pharmacy contact our office and allow 72 hours.    Cancer Center Support Programs:   > Cancer Support Group  2nd Tuesday of the month 1pm-2pm, Journey Room

## 2018-11-13 NOTE — Assessment & Plan Note (Signed)
1.  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 Melene Plan on 09/03/2018 showed 3 mm mass noted on the left vocal cord with associated glottic gap.  Rest of the exam was normal. -MicroDirect laryngoscopy with excision of the left vocal cord mass on 10/10/2018. -Pathology report is not 100% clear if it is in situ or invasive squamous cell carcinoma.  I have reached out to pathologist who is aware and will call me back tomorrow. -We reviewed results of the PET CT scan from 11/12/2018 which showed mild focal activity in the posterior aspect of the larynx which is nonspecific.  Asymmetrical metabolic activity in the posterior left oropharynx with some tissue thickening.  No evidence of metastatic adenopathy in the neck.  No distant metastatic disease. -I have also called and talked to [place].  If the pathology is in situ confirmed, she can be managed with observation and repeat laryngoscopy.  However if it is squamous cell carcinoma, with positive margins, she might benefit from radiation. -She will be referred to [place].  I will see her back in 3 months for follow-up.  2.  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 -She completed 6 cycles of adjuvant carboplatin and paclitaxel given in [place]. -She has some residual tingling/numbness in the hands from chemotherapy.  3.  Family history: -Father had colon cancer. -2 sisters had breast cancer.  One brother had lung cancer.

## 2018-11-19 DIAGNOSIS — R296 Repeated falls: Secondary | ICD-10-CM | POA: Diagnosis not present

## 2018-11-19 DIAGNOSIS — Z9181 History of falling: Secondary | ICD-10-CM | POA: Diagnosis not present

## 2018-11-19 DIAGNOSIS — C562 Malignant neoplasm of left ovary: Secondary | ICD-10-CM | POA: Diagnosis not present

## 2018-11-20 DIAGNOSIS — Z7982 Long term (current) use of aspirin: Secondary | ICD-10-CM | POA: Diagnosis not present

## 2018-11-20 DIAGNOSIS — Z7901 Long term (current) use of anticoagulants: Secondary | ICD-10-CM | POA: Diagnosis not present

## 2018-11-20 DIAGNOSIS — Z791 Long term (current) use of non-steroidal anti-inflammatories (NSAID): Secondary | ICD-10-CM | POA: Diagnosis not present

## 2018-11-20 DIAGNOSIS — Z8544 Personal history of malignant neoplasm of other female genital organs: Secondary | ICD-10-CM | POA: Diagnosis not present

## 2018-11-20 DIAGNOSIS — Z79899 Other long term (current) drug therapy: Secondary | ICD-10-CM | POA: Diagnosis not present

## 2018-11-20 DIAGNOSIS — C320 Malignant neoplasm of glottis: Secondary | ICD-10-CM | POA: Diagnosis not present

## 2018-11-20 DIAGNOSIS — Z9221 Personal history of antineoplastic chemotherapy: Secondary | ICD-10-CM | POA: Diagnosis not present

## 2018-11-20 DIAGNOSIS — J387 Other diseases of larynx: Secondary | ICD-10-CM | POA: Diagnosis not present

## 2018-11-20 DIAGNOSIS — Z90722 Acquired absence of ovaries, bilateral: Secondary | ICD-10-CM | POA: Diagnosis not present

## 2018-11-20 DIAGNOSIS — C5701 Malignant neoplasm of right fallopian tube: Secondary | ICD-10-CM | POA: Diagnosis not present

## 2018-11-20 DIAGNOSIS — Z885 Allergy status to narcotic agent status: Secondary | ICD-10-CM | POA: Diagnosis not present

## 2018-11-20 DIAGNOSIS — Z90710 Acquired absence of both cervix and uterus: Secondary | ICD-10-CM | POA: Diagnosis not present

## 2018-11-20 DIAGNOSIS — Z510 Encounter for antineoplastic radiation therapy: Secondary | ICD-10-CM | POA: Diagnosis not present

## 2018-11-22 DIAGNOSIS — E038 Other specified hypothyroidism: Secondary | ICD-10-CM | POA: Diagnosis not present

## 2018-11-22 DIAGNOSIS — F320 Major depressive disorder, single episode, mild: Secondary | ICD-10-CM | POA: Diagnosis not present

## 2018-11-22 DIAGNOSIS — I10 Essential (primary) hypertension: Secondary | ICD-10-CM | POA: Diagnosis not present

## 2018-11-26 DIAGNOSIS — Z90722 Acquired absence of ovaries, bilateral: Secondary | ICD-10-CM | POA: Diagnosis not present

## 2018-11-26 DIAGNOSIS — C320 Malignant neoplasm of glottis: Secondary | ICD-10-CM | POA: Diagnosis not present

## 2018-11-26 DIAGNOSIS — Z9221 Personal history of antineoplastic chemotherapy: Secondary | ICD-10-CM | POA: Diagnosis not present

## 2018-11-26 DIAGNOSIS — Z90710 Acquired absence of both cervix and uterus: Secondary | ICD-10-CM | POA: Diagnosis not present

## 2018-11-26 DIAGNOSIS — Z510 Encounter for antineoplastic radiation therapy: Secondary | ICD-10-CM | POA: Diagnosis not present

## 2018-11-26 DIAGNOSIS — Z8544 Personal history of malignant neoplasm of other female genital organs: Secondary | ICD-10-CM | POA: Diagnosis not present

## 2018-11-27 DIAGNOSIS — Z90710 Acquired absence of both cervix and uterus: Secondary | ICD-10-CM | POA: Diagnosis not present

## 2018-11-27 DIAGNOSIS — Z510 Encounter for antineoplastic radiation therapy: Secondary | ICD-10-CM | POA: Diagnosis not present

## 2018-11-27 DIAGNOSIS — Z8544 Personal history of malignant neoplasm of other female genital organs: Secondary | ICD-10-CM | POA: Diagnosis not present

## 2018-11-27 DIAGNOSIS — C320 Malignant neoplasm of glottis: Secondary | ICD-10-CM | POA: Diagnosis not present

## 2018-11-27 DIAGNOSIS — Z90722 Acquired absence of ovaries, bilateral: Secondary | ICD-10-CM | POA: Diagnosis not present

## 2018-11-27 DIAGNOSIS — Z9221 Personal history of antineoplastic chemotherapy: Secondary | ICD-10-CM | POA: Diagnosis not present

## 2018-12-07 DIAGNOSIS — C320 Malignant neoplasm of glottis: Secondary | ICD-10-CM | POA: Diagnosis not present

## 2018-12-10 DIAGNOSIS — C320 Malignant neoplasm of glottis: Secondary | ICD-10-CM | POA: Diagnosis not present

## 2018-12-11 DIAGNOSIS — C320 Malignant neoplasm of glottis: Secondary | ICD-10-CM | POA: Diagnosis not present

## 2018-12-12 DIAGNOSIS — C320 Malignant neoplasm of glottis: Secondary | ICD-10-CM | POA: Diagnosis not present

## 2018-12-13 DIAGNOSIS — C320 Malignant neoplasm of glottis: Secondary | ICD-10-CM | POA: Diagnosis not present

## 2018-12-14 DIAGNOSIS — C320 Malignant neoplasm of glottis: Secondary | ICD-10-CM | POA: Diagnosis not present

## 2019-01-07 DIAGNOSIS — Z79899 Other long term (current) drug therapy: Secondary | ICD-10-CM | POA: Diagnosis not present

## 2019-01-07 DIAGNOSIS — Z510 Encounter for antineoplastic radiation therapy: Secondary | ICD-10-CM | POA: Diagnosis not present

## 2019-01-07 DIAGNOSIS — Z885 Allergy status to narcotic agent status: Secondary | ICD-10-CM | POA: Diagnosis not present

## 2019-01-07 DIAGNOSIS — C320 Malignant neoplasm of glottis: Secondary | ICD-10-CM | POA: Diagnosis not present

## 2019-01-07 DIAGNOSIS — Z7901 Long term (current) use of anticoagulants: Secondary | ICD-10-CM | POA: Diagnosis not present

## 2019-01-07 DIAGNOSIS — Z90722 Acquired absence of ovaries, bilateral: Secondary | ICD-10-CM | POA: Diagnosis not present

## 2019-01-07 DIAGNOSIS — Z90710 Acquired absence of both cervix and uterus: Secondary | ICD-10-CM | POA: Diagnosis not present

## 2019-01-07 DIAGNOSIS — Z7982 Long term (current) use of aspirin: Secondary | ICD-10-CM | POA: Diagnosis not present

## 2019-01-07 DIAGNOSIS — Z9221 Personal history of antineoplastic chemotherapy: Secondary | ICD-10-CM | POA: Diagnosis not present

## 2019-01-07 DIAGNOSIS — Z8544 Personal history of malignant neoplasm of other female genital organs: Secondary | ICD-10-CM | POA: Diagnosis not present

## 2019-01-07 DIAGNOSIS — Z791 Long term (current) use of non-steroidal anti-inflammatories (NSAID): Secondary | ICD-10-CM | POA: Diagnosis not present

## 2019-01-08 DIAGNOSIS — Z90710 Acquired absence of both cervix and uterus: Secondary | ICD-10-CM | POA: Diagnosis not present

## 2019-01-08 DIAGNOSIS — Z8544 Personal history of malignant neoplasm of other female genital organs: Secondary | ICD-10-CM | POA: Diagnosis not present

## 2019-01-08 DIAGNOSIS — Z510 Encounter for antineoplastic radiation therapy: Secondary | ICD-10-CM | POA: Diagnosis not present

## 2019-01-08 DIAGNOSIS — Z9221 Personal history of antineoplastic chemotherapy: Secondary | ICD-10-CM | POA: Diagnosis not present

## 2019-01-08 DIAGNOSIS — C320 Malignant neoplasm of glottis: Secondary | ICD-10-CM | POA: Diagnosis not present

## 2019-01-08 DIAGNOSIS — Z90722 Acquired absence of ovaries, bilateral: Secondary | ICD-10-CM | POA: Diagnosis not present

## 2019-01-09 DIAGNOSIS — Z90722 Acquired absence of ovaries, bilateral: Secondary | ICD-10-CM | POA: Diagnosis not present

## 2019-01-09 DIAGNOSIS — Z510 Encounter for antineoplastic radiation therapy: Secondary | ICD-10-CM | POA: Diagnosis not present

## 2019-01-09 DIAGNOSIS — Z90710 Acquired absence of both cervix and uterus: Secondary | ICD-10-CM | POA: Diagnosis not present

## 2019-01-09 DIAGNOSIS — Z8544 Personal history of malignant neoplasm of other female genital organs: Secondary | ICD-10-CM | POA: Diagnosis not present

## 2019-01-09 DIAGNOSIS — Z9221 Personal history of antineoplastic chemotherapy: Secondary | ICD-10-CM | POA: Diagnosis not present

## 2019-01-09 DIAGNOSIS — C320 Malignant neoplasm of glottis: Secondary | ICD-10-CM | POA: Diagnosis not present

## 2019-01-10 ENCOUNTER — Encounter: Payer: Self-pay | Admitting: Cardiovascular Disease

## 2019-01-10 ENCOUNTER — Telehealth (INDEPENDENT_AMBULATORY_CARE_PROVIDER_SITE_OTHER): Payer: Medicare Other | Admitting: Cardiovascular Disease

## 2019-01-10 VITALS — Ht 67.0 in | Wt 167.5 lb

## 2019-01-10 DIAGNOSIS — I10 Essential (primary) hypertension: Secondary | ICD-10-CM

## 2019-01-10 DIAGNOSIS — Z8544 Personal history of malignant neoplasm of other female genital organs: Secondary | ICD-10-CM | POA: Diagnosis not present

## 2019-01-10 DIAGNOSIS — I4821 Permanent atrial fibrillation: Secondary | ICD-10-CM | POA: Diagnosis not present

## 2019-01-10 DIAGNOSIS — C320 Malignant neoplasm of glottis: Secondary | ICD-10-CM | POA: Diagnosis not present

## 2019-01-10 DIAGNOSIS — R079 Chest pain, unspecified: Secondary | ICD-10-CM

## 2019-01-10 DIAGNOSIS — Z510 Encounter for antineoplastic radiation therapy: Secondary | ICD-10-CM | POA: Diagnosis not present

## 2019-01-10 DIAGNOSIS — I83893 Varicose veins of bilateral lower extremities with other complications: Secondary | ICD-10-CM

## 2019-01-10 DIAGNOSIS — Z90722 Acquired absence of ovaries, bilateral: Secondary | ICD-10-CM | POA: Diagnosis not present

## 2019-01-10 DIAGNOSIS — Z9221 Personal history of antineoplastic chemotherapy: Secondary | ICD-10-CM | POA: Diagnosis not present

## 2019-01-10 DIAGNOSIS — Z90710 Acquired absence of both cervix and uterus: Secondary | ICD-10-CM | POA: Diagnosis not present

## 2019-01-10 NOTE — Patient Instructions (Signed)

## 2019-01-10 NOTE — Progress Notes (Signed)
Virtual Visit via Telephone Note   This visit type was conducted due to national recommendations for restrictions regarding the COVID-19 Pandemic (e.g. social distancing) in an effort to limit this patient's exposure and mitigate transmission in our community.  Due to her co-morbid illnesses, this patient is at least at moderate risk for complications without adequate follow up.  This format is felt to be most appropriate for this patient at this time.  The patient did not have access to video technology/had technical difficulties with video requiring transitioning to audio format only (telephone).  All issues noted in this document were discussed and addressed.  No physical exam could be performed with this format.  Please refer to the patient's chart for her  consent to telehealth for [facility].   Date:  01/10/2019   ID:  Tracy Bowman, DOB 1927-03-12, MRN [id number]  Patient Location: Home Provider Location: Office  PCP:  Neale Burly, MD  Cardiologist:  Kate Sable, MD  Electrophysiologist:  None   Evaluation Performed:  Follow-Up Visit  Chief Complaint:  Permanent atrial fibrillation  History of Present Illness:    Tracy Bowman is a 86 y.o. female with permanent atrial fibrillation.  I spoke with her husband as her voice is hoarse.  She denies palpitations and shortness of breath. She seldom has atypical chest pains which are chronic. She denies fevers and chills. She has had a cough ever since beginning radiation treatment.  Soc: Her husband, Francee Piccolo, is also a patient of mine.They have been married 45 years on June 04, 2018.   Past Medical History:  Diagnosis Date  . Anxiety neurosis   . Arthritis    "a touch in my hands" (10/09/2017)  . 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 Chronic atrial fibrillation ([place])   . Chronic diastolic (congestive) heart failure ([place])   . Depression with  anxiety   . Hyperlipidemia   . Hypothyroidism   . Vocal cord mass   . Wears glasses    Past Surgical History:  Procedure Laterality Date  . CATARACT EXTRACTION W/ INTRAOCULAR LENS  IMPLANT, BILATERAL Bilateral   . COLECTOMY Left 12/03/2013   hemicolectomy/notes 12/03/2013  . COLONOSCOPY    . FRACTURE SURGERY    . HIP FRACTURE SURGERY Right 2018; ~ 08/2017  . MICROLARYNGOSCOPY N/A 10/10/2018   Procedure: MICRO DIRECT LARYNGOSCOPY WITH EXCISION OF VOCAL CORD MASS;  Surgeon: Leta Baptist, MD;  Location: [place];  Service: ENT;  Laterality: N/A;  . MULTIPLE TOOTH EXTRACTIONS    . ORIF FEMUR FRACTURE Right 10/07/2017   Procedure: OPEN REDUCTION INTERNAL FIXATION (ORIF) DISTAL FEMUR FRACTURE;  Surgeon: Altamese Troy, MD;  Location: [place];  Service: Orthopedics;  Laterality: Right;  . TOTAL ABDOMINAL HYSTERECTOMY  12/03/2013   Archie Endo 12/03/2013     Current Meds  Medication Sig  . acetaminophen (TYLENOL) 325 MG tablet Take 2 tablets (650 mg total) by mouth every 6 (six) hours as needed.  Marland Kitchen alendronate (FOSAMAX) 70 MG tablet Take 70 mg by mouth once a week.   . ALPRAZolam (XANAX) 0.5 MG tablet Take 1 tablet (0.5 mg total) by mouth at bedtime as needed.  Marland Kitchen aspirin EC 81 MG EC tablet Take 1 tablet (81 mg total) by mouth daily.  . Cholecalciferol (VITAMIN D) 50 MCG (2000 UT) tablet Take 2,000 Units by mouth daily.   . CVS VITAMIN B12 1000 MCG tablet Take 1,000 mcg by  mouth daily.  Marland Kitchen diltiazem (CARDIZEM) 120 MG tablet Take 120 mg by mouth daily.  . furosemide (LASIX) 20 MG tablet Take 20 mg by mouth daily.   Marland Kitchen levothyroxine (SYNTHROID, LEVOTHROID) 25 MCG tablet Take 25 mcg by mouth daily before breakfast.   . magic mouthwash w/lidocaine SOLN Take 5 mLs by mouth as needed for mouth pain.  . Magnesium 200 MG TABS Take 1 tablet by mouth daily.  . midodrine (PROAMATINE) 2.5 MG tablet Take 2.5 mg by mouth 2 (two) times daily.  . mirtazapine (REMERON) 15 MG tablet Take 15 mg by mouth at bedtime.   .  Multiple Vitamins-Minerals (CVS ONE DAILY ESSENTIAL) TABS Take 1 tablet by mouth daily.  . Omega-3 Fatty Acids (FISH OIL) 1000 MG CAPS Take 1,000 mg by mouth 2 (two) times daily.   . sertraline (ZOLOFT) 50 MG tablet Take 50 mg by mouth daily.     Allergies:   Morphine and Oxycodone   Social History   Tobacco Use  . Smoking status: Never Smoker  . Smokeless tobacco: Never Used  Substance Use Topics  . Alcohol use: Never    Alcohol/week: 0.0 standard drinks  . Drug use: Never     Family Hx: The patient's family history includes Breast cancer in her sister and sister; Colon cancer in her father; Congestive Heart Failure in her mother; Dementia in her brother; Healthy in her sister.  ROS:   Please see the history of present illness.     All other systems reviewed and are negative.   Prior CV studies:   The following studies were reviewed today:  Echocardiogram was performed at [facility] on 04/16/14 and demonstrated normal left ventricular systolic function, EF [id number], diastolic dysfunction, moderate left atrial dilatation, moderate right atrial dilatation, mild aortic sclerosis, mild mitral regurgitation, moderate tricuspid regurgitation with RVSP 45 mmHg, and moderate concentric LVH.  Nuclear stress test 06/19/18:   There was no ST segment deviation noted during stress.  The study is normal. There are no perfusion defects  This is a low risk study.  The left ventricular ejection fraction is hyperdynamic (>65%).     Labs/Other Tests and Data Reviewed:    EKG:  No ECG reviewed.  Recent Labs: 10/26/2018: ALT 22; BUN 21; Creatinine, Ser 0.66; Hemoglobin 14.7; Platelets 155; Potassium 4.1; Sodium 139   Recent Lipid Panel Lab Results  Component Value Date/Time   CHOL 132 10/07/2017 03:56 AM   TRIG 66 10/07/2017 03:56 AM   HDL 43 10/07/2017 03:56 AM   CHOLHDL 3.1 10/07/2017 03:56 AM   LDLCALC 76 10/07/2017 03:56 AM    Wt Readings from Last 3 Encounters:    01/10/19 167 lb 8 oz (76 kg)  11/13/18 173 lb (78.5 kg)  10/26/18 173 lb 1.6 oz (78.5 kg)     Objective:    Vital Signs:  Ht 5\' 7"  (1.702 m)   Wt 167 lb 8 oz (76 kg)   BMI 26.23 kg/m    VITAL SIGNS:  reviewed  ASSESSMENT & PLAN:    1.Permanentatrial fibrillation: Symptomatically stable. She has moderate left atrial enlargement. Currently on long-acting diltiazem 120 mg daily. CHADSVASC score is sufficiently high enough to warrant anticoagulation therapy. However, she was reluctant to take Xarelto 15 mg daily. Given herhistory offalls, thisis likelythe most prudent strategy.  She is on aspirin 81 mg.  2. Essential HTN: This will need continued monitoring. No changes to meds.  3. Bilateral leg swelling:Due to chronic venous insufficiency. Continue compression stockings.  She also takes Lasix 20 mg daily.  4. Chest pain: Normal nuclear stress test in June 2020.  Symptoms are infrequent.   COVID-19 Education: The signs and symptoms of COVID-19 were discussed with the patient and how to seek care for testing (follow up with PCP or arrange E-visit).  The importance of social distancing was discussed today.  Time:   Today, I have spent 5 minutes with the patient with telehealth technology discussing the above problems.     Medication Adjustments/Labs and Tests Ordered: Current medicines are reviewed at length with the patient today.  Concerns regarding medicines are outlined above.   Tests Ordered: No orders of the defined types were placed in this encounter.   Medication Changes: No orders of the defined types were placed in this encounter.   Follow Up:  Virtual Visit  in 1 year(s)  Signed, Kate Sable, MD  01/10/2019 3:19 PM    [place]

## 2019-01-14 DIAGNOSIS — C320 Malignant neoplasm of glottis: Secondary | ICD-10-CM | POA: Diagnosis not present

## 2019-01-14 DIAGNOSIS — Z8544 Personal history of malignant neoplasm of other female genital organs: Secondary | ICD-10-CM | POA: Diagnosis not present

## 2019-01-14 DIAGNOSIS — Z510 Encounter for antineoplastic radiation therapy: Secondary | ICD-10-CM | POA: Diagnosis not present

## 2019-01-14 DIAGNOSIS — Z90722 Acquired absence of ovaries, bilateral: Secondary | ICD-10-CM | POA: Diagnosis not present

## 2019-01-14 DIAGNOSIS — Z90710 Acquired absence of both cervix and uterus: Secondary | ICD-10-CM | POA: Diagnosis not present

## 2019-01-14 DIAGNOSIS — Z9221 Personal history of antineoplastic chemotherapy: Secondary | ICD-10-CM | POA: Diagnosis not present

## 2019-01-15 DIAGNOSIS — Z9221 Personal history of antineoplastic chemotherapy: Secondary | ICD-10-CM | POA: Diagnosis not present

## 2019-01-15 DIAGNOSIS — C320 Malignant neoplasm of glottis: Secondary | ICD-10-CM | POA: Diagnosis not present

## 2019-01-15 DIAGNOSIS — Z90722 Acquired absence of ovaries, bilateral: Secondary | ICD-10-CM | POA: Diagnosis not present

## 2019-01-15 DIAGNOSIS — Z8544 Personal history of malignant neoplasm of other female genital organs: Secondary | ICD-10-CM | POA: Diagnosis not present

## 2019-01-15 DIAGNOSIS — Z510 Encounter for antineoplastic radiation therapy: Secondary | ICD-10-CM | POA: Diagnosis not present

## 2019-01-15 DIAGNOSIS — Z90710 Acquired absence of both cervix and uterus: Secondary | ICD-10-CM | POA: Diagnosis not present

## 2019-01-16 DIAGNOSIS — Z90722 Acquired absence of ovaries, bilateral: Secondary | ICD-10-CM | POA: Diagnosis not present

## 2019-01-16 DIAGNOSIS — Z8544 Personal history of malignant neoplasm of other female genital organs: Secondary | ICD-10-CM | POA: Diagnosis not present

## 2019-01-16 DIAGNOSIS — Z9221 Personal history of antineoplastic chemotherapy: Secondary | ICD-10-CM | POA: Diagnosis not present

## 2019-01-16 DIAGNOSIS — C320 Malignant neoplasm of glottis: Secondary | ICD-10-CM | POA: Diagnosis not present

## 2019-01-16 DIAGNOSIS — Z510 Encounter for antineoplastic radiation therapy: Secondary | ICD-10-CM | POA: Diagnosis not present

## 2019-01-16 DIAGNOSIS — Z90710 Acquired absence of both cervix and uterus: Secondary | ICD-10-CM | POA: Diagnosis not present

## 2019-01-17 DIAGNOSIS — Z90710 Acquired absence of both cervix and uterus: Secondary | ICD-10-CM | POA: Diagnosis not present

## 2019-01-17 DIAGNOSIS — Z9221 Personal history of antineoplastic chemotherapy: Secondary | ICD-10-CM | POA: Diagnosis not present

## 2019-01-17 DIAGNOSIS — C320 Malignant neoplasm of glottis: Secondary | ICD-10-CM | POA: Diagnosis not present

## 2019-01-17 DIAGNOSIS — Z8544 Personal history of malignant neoplasm of other female genital organs: Secondary | ICD-10-CM | POA: Diagnosis not present

## 2019-01-17 DIAGNOSIS — Z90722 Acquired absence of ovaries, bilateral: Secondary | ICD-10-CM | POA: Diagnosis not present

## 2019-01-17 DIAGNOSIS — Z510 Encounter for antineoplastic radiation therapy: Secondary | ICD-10-CM | POA: Diagnosis not present

## 2019-01-18 DIAGNOSIS — Z510 Encounter for antineoplastic radiation therapy: Secondary | ICD-10-CM | POA: Diagnosis not present

## 2019-01-18 DIAGNOSIS — Z8544 Personal history of malignant neoplasm of other female genital organs: Secondary | ICD-10-CM | POA: Diagnosis not present

## 2019-01-18 DIAGNOSIS — Z9221 Personal history of antineoplastic chemotherapy: Secondary | ICD-10-CM | POA: Diagnosis not present

## 2019-01-18 DIAGNOSIS — Z90722 Acquired absence of ovaries, bilateral: Secondary | ICD-10-CM | POA: Diagnosis not present

## 2019-01-18 DIAGNOSIS — Z90710 Acquired absence of both cervix and uterus: Secondary | ICD-10-CM | POA: Diagnosis not present

## 2019-01-18 DIAGNOSIS — C320 Malignant neoplasm of glottis: Secondary | ICD-10-CM | POA: Diagnosis not present

## 2019-01-22 DIAGNOSIS — Z90710 Acquired absence of both cervix and uterus: Secondary | ICD-10-CM | POA: Diagnosis not present

## 2019-01-22 DIAGNOSIS — C320 Malignant neoplasm of glottis: Secondary | ICD-10-CM | POA: Diagnosis not present

## 2019-01-22 DIAGNOSIS — Z8544 Personal history of malignant neoplasm of other female genital organs: Secondary | ICD-10-CM | POA: Diagnosis not present

## 2019-01-22 DIAGNOSIS — Z510 Encounter for antineoplastic radiation therapy: Secondary | ICD-10-CM | POA: Diagnosis not present

## 2019-01-22 DIAGNOSIS — Z9221 Personal history of antineoplastic chemotherapy: Secondary | ICD-10-CM | POA: Diagnosis not present

## 2019-01-22 DIAGNOSIS — Z90722 Acquired absence of ovaries, bilateral: Secondary | ICD-10-CM | POA: Diagnosis not present

## 2019-01-29 DIAGNOSIS — E038 Other specified hypothyroidism: Secondary | ICD-10-CM | POA: Diagnosis not present

## 2019-01-29 DIAGNOSIS — K2100 Gastro-esophageal reflux disease with esophagitis, without bleeding: Secondary | ICD-10-CM | POA: Diagnosis not present

## 2019-01-29 DIAGNOSIS — F320 Major depressive disorder, single episode, mild: Secondary | ICD-10-CM | POA: Diagnosis not present

## 2019-01-29 DIAGNOSIS — I10 Essential (primary) hypertension: Secondary | ICD-10-CM | POA: Diagnosis not present

## 2019-02-13 ENCOUNTER — Inpatient Hospital Stay (HOSPITAL_COMMUNITY): Payer: Medicare Other | Attending: Hematology | Admitting: Hematology

## 2019-02-13 ENCOUNTER — Inpatient Hospital Stay (HOSPITAL_COMMUNITY): Payer: Medicare Other

## 2019-02-13 ENCOUNTER — Other Ambulatory Visit: Payer: Self-pay

## 2019-02-13 ENCOUNTER — Encounter (HOSPITAL_COMMUNITY): Payer: Self-pay | Admitting: Hematology

## 2019-02-13 VITALS — BP 126/55 | HR 55 | Temp 97.8°F | Resp 17 | Wt 148.8 lb

## 2019-02-13 DIAGNOSIS — Z79899 Other long term (current) drug therapy: Secondary | ICD-10-CM | POA: Diagnosis not present

## 2019-02-13 DIAGNOSIS — Z90722 Acquired absence of ovaries, bilateral: Secondary | ICD-10-CM | POA: Insufficient documentation

## 2019-02-13 DIAGNOSIS — Z9221 Personal history of antineoplastic chemotherapy: Secondary | ICD-10-CM | POA: Diagnosis not present

## 2019-02-13 DIAGNOSIS — Z9079 Acquired absence of other genital organ(s): Secondary | ICD-10-CM | POA: Diagnosis not present

## 2019-02-13 DIAGNOSIS — Z90710 Acquired absence of both cervix and uterus: Secondary | ICD-10-CM | POA: Insufficient documentation

## 2019-02-13 DIAGNOSIS — I5032 Chronic diastolic (congestive) heart failure: Secondary | ICD-10-CM | POA: Insufficient documentation

## 2019-02-13 DIAGNOSIS — I482 Chronic atrial fibrillation, unspecified: Secondary | ICD-10-CM | POA: Diagnosis not present

## 2019-02-13 DIAGNOSIS — E785 Hyperlipidemia, unspecified: Secondary | ICD-10-CM | POA: Insufficient documentation

## 2019-02-13 DIAGNOSIS — Z8544 Personal history of malignant neoplasm of other female genital organs: Secondary | ICD-10-CM | POA: Insufficient documentation

## 2019-02-13 DIAGNOSIS — Z8249 Family history of ischemic heart disease and other diseases of the circulatory system: Secondary | ICD-10-CM | POA: Insufficient documentation

## 2019-02-13 DIAGNOSIS — E039 Hypothyroidism, unspecified: Secondary | ICD-10-CM | POA: Insufficient documentation

## 2019-02-13 DIAGNOSIS — C320 Malignant neoplasm of glottis: Secondary | ICD-10-CM | POA: Insufficient documentation

## 2019-02-13 DIAGNOSIS — Z7982 Long term (current) use of aspirin: Secondary | ICD-10-CM | POA: Insufficient documentation

## 2019-02-13 DIAGNOSIS — Z7952 Long term (current) use of systemic steroids: Secondary | ICD-10-CM | POA: Insufficient documentation

## 2019-02-13 DIAGNOSIS — C329 Malignant neoplasm of larynx, unspecified: Secondary | ICD-10-CM

## 2019-02-13 DIAGNOSIS — Z803 Family history of malignant neoplasm of breast: Secondary | ICD-10-CM | POA: Insufficient documentation

## 2019-02-13 LAB — TSH: TSH: 3.337 u[IU]/mL (ref 0.350–4.500)

## 2019-02-13 NOTE — Progress Notes (Signed)
[place], [place] Bow[place] [id number]   CLINIC:  Medical Oncology/Hematology  PCP:  Neale Burly, MD [place] [phone] [phone]   REASON FOR VISIT:  Follow-up for left vocal cord cancer.  CURRENT THERAPY: Observation.  INTERVAL HISTORY:  Tracy Bowman 86 y.o. 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  She continues to have hoarse voice.  She has some difficulty swallowing which is improving gradually.  Appetite is 75%.  Energy levels are 50%.  She is seen along with her husband today.  Shortness of breath on exertion is stable.  REVIEW OF SYSTEMS:  Review of Systems  HENT:   Positive for trouble swallowing.   Respiratory: Positive for shortness of breath.   Neurological: Positive for numbness.  All other systems reviewed and are negative.    PAST MEDICAL/SURGICAL HISTORY:  Past Medical History:  Diagnosis Date  . Anxiety neurosis   . Arthritis    "a touch in my hands" (10/09/2017)  . 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 Chronic atrial fibrillation ([place])   . Chronic diastolic (congestive) heart failure ([place])   . Depression with anxiety   . Hyperlipidemia   . Hypothyroidism   . Vocal cord mass   . Wears glasses    Past Surgical History:  Procedure Laterality Date  . CATARACT EXTRACTION W/ INTRAOCULAR LENS  IMPLANT, BILATERAL Bilateral   . COLECTOMY Left 12/03/2013   hemicolectomy/notes 12/03/2013  . COLONOSCOPY    . FRACTURE SURGERY    . HIP FRACTURE SURGERY Right 2018; ~ 08/2017  . MICROLARYNGOSCOPY N/A 10/10/2018   Procedure: MICRO DIRECT LARYNGOSCOPY WITH EXCISION OF VOCAL CORD MASS;  Surgeon: Leta Baptist, MD;  Location: [place];  Service: ENT;  Laterality: N/A;  . MULTIPLE TOOTH EXTRACTIONS    . ORIF FEMUR FRACTURE Right 10/07/2017   Procedure: OPEN REDUCTION INTERNAL FIXATION (ORIF) DISTAL FEMUR FRACTURE;   Surgeon: Altamese Humphreys, MD;  Location: [place];  Service: Orthopedics;  Laterality: Right;  . TOTAL ABDOMINAL HYSTERECTOMY  12/03/2013   Archie Endo 12/03/2013     SOCIAL HISTORY:  Social History   Socioeconomic History  . Marital status: Married    Spouse name: Francee Piccolo  . Number of children: Not on file  . Years of education: Not on file  . Highest education level: Not on file  Occupational History  . Not on file  Tobacco Use  . Smoking status: Never Smoker  . Smokeless tobacco: Never Used  Substance and Sexual Activity  . Alcohol use: Never    Alcohol/week: 0.0 standard drinks  . Drug use: Never  . Sexual activity: Not Currently  Other Topics Concern  . Not on file  Social History Narrative  . Not on file   Social Determinants of Health   Financial Resource Strain: Low Risk   . Difficulty of Paying Living Expenses: Not very hard  Food Insecurity: No Food Insecurity  . Worried About Charity fundraiser in the Last Year: Never true  . Ran Out of Food in the Last Year: Never true  Transportation Needs: No Transportation Needs  . Lack of Transportation (Medical): No  . Lack of Transportation (Non-Medical): No  Physical Activity: Inactive  . Days of Exercise per Week: 0 days  . Minutes of Exercise per Session: 0 min  Stress: No Stress Concern Present  . Feeling of Stress :  Only a little  Social Connections: Somewhat Isolated  . Frequency of Communication with Friends and Family: Once a week  . Frequency of Social Gatherings with Friends and Family: Once a week  . Attends Religious Services: 1 to 4 times per year  . Active Member of Clubs or Organizations: No  . Attends Archivist Meetings: Never  . Marital Status: Married  Human resources officer Violence: Not At Risk  . Fear of Current or Ex-Partner: No  . Emotionally Abused: No  . Physically Abused: No  . Sexually Abused: No    FAMILY HISTORY:  Family History  Problem Relation Age of Onset  . Colon cancer Father    . Congestive Heart Failure Mother   . Dementia Brother   . Breast cancer Sister   . Breast cancer Sister   . Healthy Sister     CURRENT MEDICATIONS:  Outpatient Encounter Medications as of 02/13/2019  Medication Sig  . alendronate (FOSAMAX) 70 MG tablet Take 70 mg by mouth once a week.   . ALPRAZolam (XANAX) 0.5 MG tablet Take 1 tablet (0.5 mg total) by mouth at bedtime as needed.  Marland Kitchen aspirin EC 81 MG EC tablet Take 1 tablet (81 mg total) by mouth daily.  . Cholecalciferol (VITAMIN D) 50 MCG (2000 UT) tablet Take 2,000 Units by mouth daily.   . CVS VITAMIN B12 1000 MCG tablet Take 1,000 mcg by mouth daily.  Marland Kitchen diltiazem (CARDIZEM) 120 MG tablet Take 120 mg by mouth daily.  . furosemide (LASIX) 20 MG tablet Take 20 mg by mouth daily.   Marland Kitchen levothyroxine (SYNTHROID, LEVOTHROID) 25 MCG tablet Take 25 mcg by mouth daily before breakfast.   . Magnesium 200 MG TABS Take 1 tablet by mouth daily.  . midodrine (PROAMATINE) 2.5 MG tablet Take 2.5 mg by mouth 2 (two) times daily.  . mirtazapine (REMERON) 15 MG tablet Take 15 mg by mouth at bedtime.   . Multiple Vitamins-Minerals (CVS ONE DAILY ESSENTIAL) TABS Take 1 tablet by mouth daily.  . Omega-3 Fatty Acids (FISH OIL) 1000 MG CAPS Take 1,000 mg by mouth 2 (two) times daily.   . predniSONE (DELTASONE) 5 MG tablet TAKE 2 TABLETS BY MOUTH EVERY DAY FOR 14 DAYS THEN TAKE 1 TABLET EVERY DAY FOR 7 DAYS  . sertraline (ZOLOFT) 50 MG tablet Take 50 mg by mouth daily.  Marland Kitchen acetaminophen (TYLENOL) 325 MG tablet Take 2 tablets (650 mg total) by mouth every 6 (six) hours as needed. (Patient not taking: Reported on 02/13/2019)  . magic mouthwash w/lidocaine SOLN Take 5 mLs by mouth as needed for mouth pain.   No facility-administered encounter medications on file as of 02/13/2019.    ALLERGIES:  Allergies  Allergen Reactions  . Morphine Other (See Comments)    hallucinations   . Oxycodone Other (See Comments)    Pt does not tolerate well extreme dry mouth        PHYSICAL EXAM:  ECOG Performance status: 1  Vitals:   02/13/19 1152  BP: (!) 126/55  Pulse: (!) 55  Resp: 17  Temp: 97.8 F (36.6 C)  SpO2: 97%   Filed Weights   02/13/19 1152  Weight: 148 lb 12.8 oz (67.5 kg)    Physical Exam Vitals reviewed.  Constitutional:      Appearance: Normal appearance.  HENT:     Mouth/Throat:     Mouth: Mucous membranes are moist.     Pharynx: Oropharynx is clear. No oropharyngeal exudate.  Cardiovascular:  Rate and Rhythm: Normal rate and regular rhythm.     Heart sounds: Normal heart sounds.  Pulmonary:     Effort: Pulmonary effort is normal.     Breath sounds: Normal breath sounds.  Abdominal:     General: There is no distension.     Palpations: Abdomen is soft. There is no mass.  Lymphadenopathy:     Cervical: No cervical adenopathy.  Skin:    General: Skin is warm.  Neurological:     General: No focal deficit present.     Mental Status: She is alert and oriented to person, place, and time.  Psychiatric:        Mood and Affect: Mood normal.        Behavior: Behavior normal.      LABORATORY DATA:  I have reviewed the labs as listed.  CBC    Component Value Date/Time   WBC 5.8 10/26/2018 1225   RBC 4.63 10/26/2018 1225   HGB 14.7 10/26/2018 1225   HCT 46.0 10/26/2018 1225   PLT 155 10/26/2018 1225   MCV 99.4 10/26/2018 1225   MCH 31.7 10/26/2018 1225   MCHC 32.0 10/26/2018 1225   RDW 12.9 10/26/2018 1225   LYMPHSABS 1.1 10/26/2018 1225   MONOABS 0.6 10/26/2018 1225   EOSABS 0.1 10/26/2018 1225   BASOSABS 0.0 10/26/2018 1225   CMP Latest Ref Rng & Units 10/26/2018 10/02/2018 10/10/2017  Glucose 70 - 99 mg/dL 123(H) 193(H) 136(H)  BUN 8 - 23 mg/dL 21 16 19   Creatinine 0.44 - 1.00 mg/dL 0.66 0.75 0.54  Sodium 135 - 145 mmol/L 139 140 140  Potassium 3.5 - 5.1 mmol/L 4.1 3.9 4.0  Chloride 98 - 111 mmol/L 104 104 105  CO2 22 - 32 mmol/L 25 27 28   Calcium 8.9 - 10.3 mg/dL 9.0 8.8(L) 7.7(L)  Total Protein 6.5  - 8.1 g/dL 7.4 - -  Total Bilirubin 0.3 - 1.2 mg/dL 1.6(H) - -  Alkaline Phos 38 - 126 U/L 63 - -  AST 15 - 41 U/L 25 - -  ALT 0 - 44 U/L 22 - -       DIAGNOSTIC IMAGING:  I have independently reviewed the scans and discussed with the patient.     ASSESSMENT & PLAN:   Laryngeal squamous cell carcinoma ([place]) 1.  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 Benjamine Mola on 09/03/2018 showed 3 mm mass noted on the left vocal cord with associated glottic gap.  Rest of the exam was normal. -MicroDirect laryngoscopy with excision of the left vocal cord mass on 10/10/2018. -Pathology report not 100% clear if it is in situ or invasive squamous cell carcinoma. -PET scan on 11/12/2018 showed mild focal activity in the posterior aspect of the larynx which is nonspecific.  Asymmetric metabolic activity in the posterior left oropharynx with some tissue thickening.  No evidence of metastatic adenopathy in the neck. -She was evaluated by Dr.Yanagihara.  She received 28 treatments, completed likely on 01/18/2019. -She still has hoarse voice.  Her swallowing ability is improving. -She has an appointment to see Dr.Yanagihara next week. -I will schedule her for PET scan in 2 months.  We will also make an appointment for her to see Dr. Benjamine Mola for direct laryngoscopy.  2.  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 -She has some residual tingling/numbness in the hands from  chemotherapy.      Orders placed this encounter:  Orders Placed This Encounter  Procedures  . NM PET Image Restag (PS) Skull Base To Thigh  . TSH      Derek Jack, [place] [phone]

## 2019-02-13 NOTE — Assessment & Plan Note (Signed)
1.  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 Benjamine Mola on 09/03/2018 showed 3 mm mass noted on the left vocal cord with associated glottic gap.  Rest of the exam was normal. -MicroDirect laryngoscopy with excision of the left vocal cord mass on 10/10/2018. -Pathology report not 100% clear if it is in situ or invasive squamous cell carcinoma. -PET scan on 11/12/2018 showed mild focal activity in the posterior aspect of the larynx which is nonspecific.  Asymmetric metabolic activity in the posterior left oropharynx with some tissue thickening.  No evidence of metastatic adenopathy in the neck. -She was evaluated by Dr.Yanagihara.  She received 28 treatments, completed likely on 01/18/2019. -She still has hoarse voice.  Her swallowing ability is improving. -She has an appointment to see Dr.Yanagihara next week. -I will schedule her for PET scan in 2 months.  We will also make an appointment for her to see Dr. Benjamine Mola for direct laryngoscopy.  2.  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 -She has some residual tingling/numbness in the hands from chemotherapy.

## 2019-02-13 NOTE — Patient Instructions (Addendum)
[place] at [facility] Discharge Instructions  You were seen today by Dr. Delton Coombes. He went over your recent lab results and how you've been feeling since your radiation. He will repeat a PET prior to your next visit. He will see you back in 2 months for labs and follow up.   Thank you for choosing [place] at [facility] to provide your oncology and hematology care.  To afford each patient quality time with our provider, please arrive at least 15 minutes before your scheduled appointment time.   If you have a lab appointment with the [facility] please come in thru the  Main Entrance and check in at the main information desk  You need to re-schedule your appointment should you arrive 10 or more minutes late.  We strive to give you quality time with our providers, and arriving late affects you and other patients whose appointments are after yours.  Also, if you no show three or more times for appointments you may be dismissed from the clinic at the providers discretion.     Again, thank you for choosing [facility].  Our hope is that these requests will decrease the amount of time that you wait before being seen by our physicians.       _____________________________________________________________  Should you have questions after your visit to [facility], please contact our office at [phone] between the hours of 8:00 a.m. and 4:30 p.m.  Voicemails left after 4:00 p.m. will not be returned until the following business day.  For prescription refill requests, have your pharmacy contact our office and allow 72 hours.    Cancer Center Support Programs:   > Cancer Support Group  2nd Tuesday of the month 1pm-2pm, Journey Room

## 2019-02-14 ENCOUNTER — Other Ambulatory Visit (HOSPITAL_COMMUNITY): Payer: Self-pay | Admitting: *Deleted

## 2019-02-14 DIAGNOSIS — C329 Malignant neoplasm of larynx, unspecified: Secondary | ICD-10-CM

## 2019-02-26 DIAGNOSIS — K2100 Gastro-esophageal reflux disease with esophagitis, without bleeding: Secondary | ICD-10-CM | POA: Diagnosis not present

## 2019-02-26 DIAGNOSIS — I10 Essential (primary) hypertension: Secondary | ICD-10-CM | POA: Diagnosis not present

## 2019-02-26 DIAGNOSIS — F320 Major depressive disorder, single episode, mild: Secondary | ICD-10-CM | POA: Diagnosis not present

## 2019-02-26 DIAGNOSIS — E038 Other specified hypothyroidism: Secondary | ICD-10-CM | POA: Diagnosis not present

## 2019-02-28 DIAGNOSIS — Z791 Long term (current) use of non-steroidal anti-inflammatories (NSAID): Secondary | ICD-10-CM | POA: Diagnosis not present

## 2019-02-28 DIAGNOSIS — Z7901 Long term (current) use of anticoagulants: Secondary | ICD-10-CM | POA: Diagnosis not present

## 2019-02-28 DIAGNOSIS — C329 Malignant neoplasm of larynx, unspecified: Secondary | ICD-10-CM | POA: Diagnosis not present

## 2019-02-28 DIAGNOSIS — Z7982 Long term (current) use of aspirin: Secondary | ICD-10-CM | POA: Diagnosis not present

## 2019-02-28 DIAGNOSIS — Z8544 Personal history of malignant neoplasm of other female genital organs: Secondary | ICD-10-CM | POA: Diagnosis not present

## 2019-02-28 DIAGNOSIS — C320 Malignant neoplasm of glottis: Secondary | ICD-10-CM | POA: Diagnosis not present

## 2019-02-28 DIAGNOSIS — Z510 Encounter for antineoplastic radiation therapy: Secondary | ICD-10-CM | POA: Diagnosis not present

## 2019-02-28 DIAGNOSIS — Z885 Allergy status to narcotic agent status: Secondary | ICD-10-CM | POA: Diagnosis not present

## 2019-02-28 DIAGNOSIS — Z90722 Acquired absence of ovaries, bilateral: Secondary | ICD-10-CM | POA: Diagnosis not present

## 2019-02-28 DIAGNOSIS — Z90710 Acquired absence of both cervix and uterus: Secondary | ICD-10-CM | POA: Diagnosis not present

## 2019-02-28 DIAGNOSIS — Z79899 Other long term (current) drug therapy: Secondary | ICD-10-CM | POA: Diagnosis not present

## 2019-02-28 DIAGNOSIS — Z9221 Personal history of antineoplastic chemotherapy: Secondary | ICD-10-CM | POA: Diagnosis not present

## 2019-03-14 DIAGNOSIS — F320 Major depressive disorder, single episode, mild: Secondary | ICD-10-CM | POA: Diagnosis not present

## 2019-03-14 DIAGNOSIS — I10 Essential (primary) hypertension: Secondary | ICD-10-CM | POA: Diagnosis not present

## 2019-03-14 DIAGNOSIS — K2100 Gastro-esophageal reflux disease with esophagitis, without bleeding: Secondary | ICD-10-CM | POA: Diagnosis not present

## 2019-03-14 DIAGNOSIS — E038 Other specified hypothyroidism: Secondary | ICD-10-CM | POA: Diagnosis not present

## 2019-03-17 DIAGNOSIS — Z23 Encounter for immunization: Secondary | ICD-10-CM | POA: Diagnosis not present

## 2019-03-19 DIAGNOSIS — Z8521 Personal history of malignant neoplasm of larynx: Secondary | ICD-10-CM | POA: Diagnosis not present

## 2019-04-01 DIAGNOSIS — E038 Other specified hypothyroidism: Secondary | ICD-10-CM | POA: Diagnosis not present

## 2019-04-01 DIAGNOSIS — F320 Major depressive disorder, single episode, mild: Secondary | ICD-10-CM | POA: Diagnosis not present

## 2019-04-01 DIAGNOSIS — K2100 Gastro-esophageal reflux disease with esophagitis, without bleeding: Secondary | ICD-10-CM | POA: Diagnosis not present

## 2019-04-01 DIAGNOSIS — I10 Essential (primary) hypertension: Secondary | ICD-10-CM | POA: Diagnosis not present

## 2019-04-09 DIAGNOSIS — F320 Major depressive disorder, single episode, mild: Secondary | ICD-10-CM | POA: Diagnosis not present

## 2019-04-09 DIAGNOSIS — I10 Essential (primary) hypertension: Secondary | ICD-10-CM | POA: Diagnosis not present

## 2019-04-09 DIAGNOSIS — E038 Other specified hypothyroidism: Secondary | ICD-10-CM | POA: Diagnosis not present

## 2019-04-09 DIAGNOSIS — K2100 Gastro-esophageal reflux disease with esophagitis, without bleeding: Secondary | ICD-10-CM | POA: Diagnosis not present

## 2019-04-14 DIAGNOSIS — Z23 Encounter for immunization: Secondary | ICD-10-CM | POA: Diagnosis not present

## 2019-04-15 ENCOUNTER — Other Ambulatory Visit (HOSPITAL_COMMUNITY): Payer: Medicare Other

## 2019-04-15 ENCOUNTER — Encounter (HOSPITAL_COMMUNITY): Payer: Medicare Other

## 2019-04-17 ENCOUNTER — Ambulatory Visit (HOSPITAL_COMMUNITY): Payer: Medicare Other | Admitting: Hematology

## 2019-04-23 ENCOUNTER — Ambulatory Visit (HOSPITAL_COMMUNITY)
Admission: RE | Admit: 2019-04-23 | Discharge: 2019-04-23 | Disposition: A | Discharge status: Home / Self Care | Payer: Medicare Other | Source: Ambulatory Visit | Attending: Hematology | Admitting: Hematology

## 2019-04-23 ENCOUNTER — Encounter (HOSPITAL_COMMUNITY): Payer: Self-pay

## 2019-04-23 ENCOUNTER — Other Ambulatory Visit: Payer: Self-pay

## 2019-04-23 DIAGNOSIS — I700 Atherosclerosis of aorta: Secondary | ICD-10-CM | POA: Diagnosis not present

## 2019-04-23 DIAGNOSIS — I482 Chronic atrial fibrillation, unspecified: Secondary | ICD-10-CM | POA: Insufficient documentation

## 2019-04-23 DIAGNOSIS — Z7982 Long term (current) use of aspirin: Secondary | ICD-10-CM | POA: Insufficient documentation

## 2019-04-23 DIAGNOSIS — C329 Malignant neoplasm of larynx, unspecified: Secondary | ICD-10-CM

## 2019-04-23 DIAGNOSIS — E785 Hyperlipidemia, unspecified: Secondary | ICD-10-CM | POA: Diagnosis not present

## 2019-04-23 DIAGNOSIS — C760 Malignant neoplasm of head, face and neck: Secondary | ICD-10-CM | POA: Diagnosis not present

## 2019-04-23 DIAGNOSIS — I5032 Chronic diastolic (congestive) heart failure: Secondary | ICD-10-CM | POA: Insufficient documentation

## 2019-04-23 DIAGNOSIS — Z79899 Other long term (current) drug therapy: Secondary | ICD-10-CM | POA: Diagnosis not present

## 2019-04-23 LAB — GLUCOSE, CAPILLARY: Glucose-Capillary: 134 mg/dL — ABNORMAL HIGH (ref 70–99)

## 2019-04-23 MED ORDER — FLUDEOXYGLUCOSE F - 18 (FDG) INJECTION
7.3000 | Freq: Once | INTRAVENOUS | Status: AC
  Administered 2019-04-23: 7.3 via INTRAVENOUS

## 2019-04-25 ENCOUNTER — Other Ambulatory Visit: Payer: Self-pay

## 2019-04-25 ENCOUNTER — Inpatient Hospital Stay (HOSPITAL_COMMUNITY): Payer: Medicare Other | Attending: Hematology

## 2019-04-25 ENCOUNTER — Encounter (HOSPITAL_COMMUNITY): Payer: Self-pay | Admitting: Hematology

## 2019-04-25 ENCOUNTER — Inpatient Hospital Stay (HOSPITAL_BASED_OUTPATIENT_CLINIC_OR_DEPARTMENT_OTHER): Payer: Medicare Other | Admitting: Hematology

## 2019-04-25 DIAGNOSIS — E039 Hypothyroidism, unspecified: Secondary | ICD-10-CM | POA: Diagnosis not present

## 2019-04-25 DIAGNOSIS — E785 Hyperlipidemia, unspecified: Secondary | ICD-10-CM | POA: Diagnosis not present

## 2019-04-25 DIAGNOSIS — Z9221 Personal history of antineoplastic chemotherapy: Secondary | ICD-10-CM | POA: Diagnosis not present

## 2019-04-25 DIAGNOSIS — Z8544 Personal history of malignant neoplasm of other female genital organs: Secondary | ICD-10-CM | POA: Diagnosis not present

## 2019-04-25 DIAGNOSIS — M1990 Unspecified osteoarthritis, unspecified site: Secondary | ICD-10-CM | POA: Diagnosis not present

## 2019-04-25 DIAGNOSIS — Z79899 Other long term (current) drug therapy: Secondary | ICD-10-CM | POA: Diagnosis not present

## 2019-04-25 DIAGNOSIS — C320 Malignant neoplasm of glottis: Secondary | ICD-10-CM | POA: Insufficient documentation

## 2019-04-25 DIAGNOSIS — C329 Malignant neoplasm of larynx, unspecified: Secondary | ICD-10-CM

## 2019-04-25 DIAGNOSIS — I5032 Chronic diastolic (congestive) heart failure: Secondary | ICD-10-CM | POA: Insufficient documentation

## 2019-04-25 DIAGNOSIS — I482 Chronic atrial fibrillation, unspecified: Secondary | ICD-10-CM | POA: Diagnosis not present

## 2019-04-25 DIAGNOSIS — Z90722 Acquired absence of ovaries, bilateral: Secondary | ICD-10-CM | POA: Insufficient documentation

## 2019-04-25 LAB — TSH: TSH: 6.144 u[IU]/mL — ABNORMAL HIGH (ref 0.350–4.500)

## 2019-04-25 LAB — CBC WITH DIFFERENTIAL/PLATELET
Abs Immature Granulocytes: 0.02 10*3/uL (ref 0.00–0.07)
Basophils Absolute: 0 10*3/uL (ref 0.0–0.1)
Basophils Relative: 1 %
Eosinophils Absolute: 0.2 10*3/uL (ref 0.0–0.5)
Eosinophils Relative: 3 %
HCT: 45 % (ref 36.0–46.0)
Hemoglobin: 14.4 g/dL (ref 12.0–15.0)
Immature Granulocytes: 0 %
Lymphocytes Relative: 24 %
Lymphs Abs: 1.3 10*3/uL (ref 0.7–4.0)
MCH: 31.4 pg (ref 26.0–34.0)
MCHC: 32 g/dL (ref 30.0–36.0)
MCV: 98 fL (ref 80.0–100.0)
Monocytes Absolute: 0.5 10*3/uL (ref 0.1–1.0)
Monocytes Relative: 9 %
Neutro Abs: 3.3 10*3/uL (ref 1.7–7.7)
Neutrophils Relative %: 63 %
Platelets: 155 10*3/uL (ref 150–400)
RBC: 4.59 MIL/uL (ref 3.87–5.11)
RDW: 13.5 % (ref 11.5–15.5)
WBC: 5.3 10*3/uL (ref 4.0–10.5)
nRBC: 0 % (ref 0.0–0.2)

## 2019-04-25 LAB — COMPREHENSIVE METABOLIC PANEL
ALT: 20 U/L (ref 0–44)
AST: 23 U/L (ref 15–41)
Albumin: 4.3 g/dL (ref 3.5–5.0)
Alkaline Phosphatase: 59 U/L (ref 38–126)
Anion gap: 11 (ref 5–15)
BUN: 19 mg/dL (ref 8–23)
CO2: 29 mmol/L (ref 22–32)
Calcium: 9.1 mg/dL (ref 8.9–10.3)
Chloride: 100 mmol/L (ref 98–111)
Creatinine, Ser: 0.69 mg/dL (ref 0.44–1.00)
GFR calc Af Amer: >60 mL/min (ref >60)
GFR calc non Af Amer: >60 mL/min (ref >60)
Glucose, Bld: 187 mg/dL — ABNORMAL HIGH (ref 70–99)
Potassium: 4 mmol/L (ref 3.5–5.1)
Sodium: 140 mmol/L (ref 135–145)
Total Bilirubin: 1.1 mg/dL (ref 0.3–1.2)
Total Protein: 7.3 g/dL (ref 6.5–8.1)

## 2019-04-25 MED ORDER — LEVOTHYROXINE SODIUM 50 MCG PO TABS: 25.0000 ug | ORAL_TABLET | Freq: Every day | ORAL | 1 refills | Status: DC

## 2019-04-25 NOTE — Assessment & Plan Note (Signed)
1.  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 Benjamine Mola on 09/03/2018 shows 3 mm mass noted in the left vocal cord with associated glottic gap.  MicroDirect laryngoscopy with excision on 10/10/2018. -Pathology report showed in situ versus invasive squamous cell carcinoma. -PET scan on 11/12/2018 showed mild focal activity in the posterior aspect of the larynx, nonspecific. -She received radiation therapy with Dr.Yanagihara and completed on 01/18/2019. -She does not report any difficulty swallowing.  I have reviewed PET scan from 04/23/2019 which did not show any evidence of local recurrence.  No hypermetabolic disease in the neck chest, abdomen or pelvis. -We will plan to repeat a CT of the neck in 6 months.  2.  Hypothyroidism: -She is currently on 25 mcg of Synthroid.  Today TSH is 6.11.  This has gone up from 2 to 3 months ago. -We will increase Synthroid to 50 mcg daily.  Will check a TSH in 2 months.  3.  Stage IIIc right fallopian tube serous carcinoma: -Diagnosed on 01/06/2014, underwent TAH and BSO followed by 6 cycles of adjuvant carboplatin and paclitaxel given in [place]. -She has some residual numbness/tingling in the hands from chemotherapy.

## 2019-04-25 NOTE — Progress Notes (Signed)
[place], [place] [address]   CLINIC:  Medical Oncology/Hematology  PCP:  Neale Burly, MD [place] [id number] [phone]   REASON FOR VISIT:  Follow-up for left vocal cord cancer.  CURRENT THERAPY: Observation.  INTERVAL HISTORY:  Tracy Bowman 86 y.o. female seen for follow-up of left vocal cord squamous cell carcinoma and hypothyroidism.  Appetite is not depressed.  Energy levels are 50%.  Reports taking Synthroid 25 mcg on empty stomach every day.  Numbness in the hands has been stable.  Denies any dysphagia.  REVIEW OF SYSTEMS:  Review of Systems  Respiratory: Positive for cough.   Neurological: Positive for numbness.  Psychiatric/Behavioral: The patient is nervous/anxious.   All other systems reviewed and are negative.    PAST MEDICAL/SURGICAL HISTORY:  Past Medical History:  Diagnosis Date  . Anxiety neurosis   . Arthritis    "a touch in my hands" (10/09/2017)  . 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 Chronic atrial fibrillation ([place])   . Chronic diastolic (congestive) heart failure ([place])   . Depression with anxiety   . Hyperlipidemia   . Hypothyroidism   . Vocal cord mass   . Wears glasses    Past Surgical History:  Procedure Laterality Date  . CATARACT EXTRACTION W/ INTRAOCULAR LENS  IMPLANT, BILATERAL Bilateral   . COLECTOMY Left 12/03/2013   hemicolectomy/notes 12/03/2013  . COLONOSCOPY    . FRACTURE SURGERY    . HIP FRACTURE SURGERY Right 2018; ~ 08/2017  . MICROLARYNGOSCOPY N/A 10/10/2018   Procedure: MICRO DIRECT LARYNGOSCOPY WITH EXCISION OF VOCAL CORD MASS;  Surgeon: Leta Baptist, MD;  Location: [place];  Service: ENT;  Laterality: N/A;  . MULTIPLE TOOTH EXTRACTIONS    . ORIF FEMUR FRACTURE Right 10/07/2017   Procedure: OPEN REDUCTION INTERNAL FIXATION (ORIF) DISTAL FEMUR FRACTURE;  Surgeon: Altamese , MD;  Location:  [place];  Service: Orthopedics;  Laterality: Right;  . TOTAL ABDOMINAL HYSTERECTOMY  12/03/2013   Archie Endo 12/03/2013     SOCIAL HISTORY:  Social History   Socioeconomic History  . Marital status: Married    Spouse name: Francee Piccolo  . Number of children: Not on file  . Years of education: Not on file  . Highest education level: Not on file  Occupational History  . Not on file  Tobacco Use  . Smoking status: Never Smoker  . Smokeless tobacco: Never Used  Substance and Sexual Activity  . Alcohol use: Never    Alcohol/week: 0.0 standard drinks  . Drug use: Never  . Sexual activity: Not Currently  Other Topics Concern  . Not on file  Social History Narrative  . Not on file   Social Determinants of Health   Financial Resource Strain: Low Risk   . Difficulty of Paying Living Expenses: Not very hard  Food Insecurity: No Food Insecurity  . Worried About Charity fundraiser in the Last Year: Never true  . Ran Out of Food in the Last Year: Never true  Transportation Needs: No Transportation Needs  . Lack of Transportation (Medical): No  . Lack of Transportation (Non-Medical): No  Physical Activity: Inactive  . Days of Exercise per Week: 0 days  . Minutes of Exercise per Session: 0 min  Stress: No Stress Concern Present  . Feeling of Stress : Only a little  Social Connections: Somewhat Isolated  . Frequency  of Communication with Friends and Family: Once a week  . Frequency of Social Gatherings with Friends and Family: Once a week  . Attends Religious Services: 1 to 4 times per year  . Active Member of Clubs or Organizations: No  . Attends Archivist Meetings: Never  . Marital Status: Married  Human resources officer Violence: Not At Risk  . Fear of Current or Ex-Partner: No  . Emotionally Abused: No  . Physically Abused: No  . Sexually Abused: No    FAMILY HISTORY:  Family History  Problem Relation Age of Onset  . Colon cancer Father   . Congestive Heart Failure Mother     . Dementia Brother   . Breast cancer Sister   . Breast cancer Sister   . Healthy Sister     CURRENT MEDICATIONS:  Outpatient Encounter Medications as of 04/25/2019  Medication Sig  . alendronate (FOSAMAX) 70 MG tablet Take 70 mg by mouth once a week.   . ALPRAZolam (XANAX) 0.5 MG tablet Take 1 tablet (0.5 mg total) by mouth at bedtime as needed.  Marland Kitchen aspirin EC 81 MG EC tablet Take 1 tablet (81 mg total) by mouth daily.  . calcium carbonate (CALCIUM 600) 1500 (600 Ca) MG TABS tablet Take 1 tablet by mouth daily.  . Cholecalciferol (VITAMIN D) 50 MCG (2000 UT) tablet Take 2,000 Units by mouth daily.   . CVS VITAMIN B12 1000 MCG tablet Take 1,000 mcg by mouth daily.  Marland Kitchen diltiazem (CARDIZEM) 120 MG tablet Take 120 mg by mouth daily.  . furosemide (LASIX) 20 MG tablet Take 20 mg by mouth daily.   Marland Kitchen levothyroxine (SYNTHROID) 50 MCG tablet Take 0.5 tablets (25 mcg total) by mouth daily before breakfast.  . Magnesium 200 MG TABS Take 1 tablet by mouth daily.  . midodrine (PROAMATINE) 2.5 MG tablet Take 2.5 mg by mouth 2 (two) times daily.  . mirtazapine (REMERON) 15 MG tablet Take 15 mg by mouth at bedtime.   . Multiple Vitamins-Minerals (CVS ONE DAILY ESSENTIAL) TABS Take 1 tablet by mouth daily.  . Omega-3 Fatty Acids (FISH OIL) 1000 MG CAPS Take 1,000 mg by mouth 2 (two) times daily.   . sertraline (ZOLOFT) 50 MG tablet Take 50 mg by mouth daily.  . [DISCONTINUED] levothyroxine (SYNTHROID, LEVOTHROID) 25 MCG tablet Take 25 mcg by mouth daily before breakfast.   . acetaminophen (TYLENOL) 325 MG tablet Take 2 tablets (650 mg total) by mouth every 6 (six) hours as needed. (Patient not taking: Reported on 02/13/2019)  . magic mouthwash w/lidocaine SOLN Take 5 mLs by mouth as needed for mouth pain.  . [DISCONTINUED] predniSONE (DELTASONE) 5 MG tablet TAKE 2 TABLETS BY MOUTH EVERY DAY FOR 14 DAYS THEN TAKE 1 TABLET EVERY DAY FOR 7 DAYS   No facility-administered encounter medications on file as of  04/25/2019.    ALLERGIES:  Allergies  Allergen Reactions  . Morphine Other (See Comments)    hallucinations   . Oxycodone Other (See Comments)    Pt does not tolerate well extreme dry mouth       PHYSICAL EXAM:  ECOG Performance status: 1  Vitals:   04/25/19 1101  BP: (!) 153/66  Pulse: 64  Resp: 19  Temp: (!) 96.8 F (36 C)  SpO2: 95%   Filed Weights   04/25/19 1101  Weight: 169 lb 8 oz (76.9 kg)    Physical Exam Vitals reviewed.  Constitutional:      Appearance: Normal appearance.  HENT:  Mouth/Throat:     Mouth: Mucous membranes are moist.     Pharynx: Oropharynx is clear. No oropharyngeal exudate.  Cardiovascular:     Rate and Rhythm: Normal rate and regular rhythm.     Heart sounds: Normal heart sounds.  Pulmonary:     Effort: Pulmonary effort is normal.     Breath sounds: Normal breath sounds.  Abdominal:     General: There is no distension.     Palpations: Abdomen is soft. There is no mass.  Lymphadenopathy:     Cervical: No cervical adenopathy.  Skin:    General: Skin is warm.  Neurological:     General: No focal deficit present.     Mental Status: She is alert and oriented to person, place, and time.  Psychiatric:        Mood and Affect: Mood normal.        Behavior: Behavior normal.    No palpable lymphadenopathy in the neck.  LABORATORY DATA:  I have reviewed the labs as listed.  CBC    Component Value Date/Time   WBC 5.3 04/25/2019 0959   RBC 4.59 04/25/2019 0959   HGB 14.4 04/25/2019 0959   HCT 45.0 04/25/2019 0959   PLT 155 04/25/2019 0959   MCV 98.0 04/25/2019 0959   MCH 31.4 04/25/2019 0959   MCHC 32.0 04/25/2019 0959   RDW 13.5 04/25/2019 0959   LYMPHSABS 1.3 04/25/2019 0959   MONOABS 0.5 04/25/2019 0959   EOSABS 0.2 04/25/2019 0959   BASOSABS 0.0 04/25/2019 0959   CMP Latest Ref Rng & Units 04/25/2019 10/26/2018 10/02/2018  Glucose 70 - 99 mg/dL 187(H) 123(H) 193(H)  BUN 8 - 23 mg/dL 19 21 16   Creatinine 0.44 - 1.00  mg/dL 0.69 0.66 0.75  Sodium 135 - 145 mmol/L 140 139 140  Potassium 3.5 - 5.1 mmol/L 4.0 4.1 3.9  Chloride 98 - 111 mmol/L 100 104 104  CO2 22 - 32 mmol/L 29 25 27   Calcium 8.9 - 10.3 mg/dL 9.1 9.0 8.8(L)  Total Protein 6.5 - 8.1 g/dL 7.3 7.4 -  Total Bilirubin 0.3 - 1.2 mg/dL 1.1 1.6(H) -  Alkaline Phos 38 - 126 U/L 59 63 -  AST 15 - 41 U/L 23 25 -  ALT 0 - 44 U/L 20 22 -       DIAGNOSTIC IMAGING:  I have independently reviewed PET scan and discussed with the patient.     ASSESSMENT & PLAN:   Laryngeal squamous cell carcinoma ([place]) 1.  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 Benjamine Mola on 09/03/2018 shows 3 mm mass noted in the left vocal cord with associated glottic gap.  MicroDirect laryngoscopy with excision on 10/10/2018. -Pathology report showed in situ versus invasive squamous cell carcinoma. -PET scan on 11/12/2018 showed mild focal activity in the posterior aspect of the larynx, nonspecific. -She received radiation therapy with Dr.Yanagihara and completed on 01/18/2019. -She does not report any difficulty swallowing.  I have reviewed PET scan from 04/23/2019 which did not show any evidence of local recurrence.  No hypermetabolic disease in the neck chest, abdomen or pelvis. -We will plan to repeat a CT of the neck in 6 months.  2.  Hypothyroidism: -She is currently on 25 mcg of Synthroid.  Today TSH is 6.11.  This has gone up from 2 to 3 months ago. -We will increase Synthroid to 50 mcg daily.  Will check a TSH in 2 months.  3.  Stage IIIc right  fallopian tube serous carcinoma: -Diagnosed on 01/06/2014, underwent TAH and BSO followed by 6 cycles of adjuvant carboplatin and paclitaxel given in [place]. -She has some residual numbness/tingling in the hands from chemotherapy.      Orders placed this encounter:  No orders of the defined types were placed in this encounter.     Derek Jack, MD [place] [phone]

## 2019-04-25 NOTE — Patient Instructions (Addendum)
[place] at [facility] Discharge Instructions  You were seen today by Dr. Delton Coombes. He went over your recent lab results. Your thyroid numbers were elevated so Dr. Raliegh Ip is sending in a new prescription of Levothyroxine 25mcg to be taken daily. He will see you back in 2 months for labs and follow up.   Thank you for choosing [place] at [facility] to provide your oncology and hematology care.  To afford each patient quality time with our provider, please arrive at least 15 minutes before your scheduled appointment time.   If you have a lab appointment with the [place] please come in thru the  Main Entrance and check in at the main information desk  You need to re-schedule your appointment should you arrive 10 or more minutes late.  We strive to give you quality time with our providers, and arriving late affects you and other patients whose appointments are after yours.  Also, if you no show three or more times for appointments you may be dismissed from the clinic at the providers discretion.     Again, thank you for choosing [facility].  Our hope is that these requests will decrease the amount of time that you wait before being seen by our physicians.       _____________________________________________________________  Should you have questions after your visit to [facility], please contact our office at [phone] between the hours of 8:00 a.m. and 4:30 p.m.  Voicemails left after 4:00 p.m. will not be returned until the following business day.  For prescription refill requests, have your pharmacy contact our office and allow 72 hours.    Cancer Center Support Programs:   > Cancer Support Group  2nd Tuesday of the month 1pm-2pm, Journey Room

## 2019-05-16 DIAGNOSIS — K2100 Gastro-esophageal reflux disease with esophagitis, without bleeding: Secondary | ICD-10-CM | POA: Diagnosis not present

## 2019-05-16 DIAGNOSIS — I10 Essential (primary) hypertension: Secondary | ICD-10-CM | POA: Diagnosis not present

## 2019-05-16 DIAGNOSIS — E038 Other specified hypothyroidism: Secondary | ICD-10-CM | POA: Diagnosis not present

## 2019-05-16 DIAGNOSIS — F320 Major depressive disorder, single episode, mild: Secondary | ICD-10-CM | POA: Diagnosis not present

## 2019-06-10 DIAGNOSIS — C562 Malignant neoplasm of left ovary: Secondary | ICD-10-CM | POA: Diagnosis not present

## 2019-06-14 DIAGNOSIS — I10 Essential (primary) hypertension: Secondary | ICD-10-CM | POA: Diagnosis not present

## 2019-06-14 DIAGNOSIS — K2100 Gastro-esophageal reflux disease with esophagitis, without bleeding: Secondary | ICD-10-CM | POA: Diagnosis not present

## 2019-06-14 DIAGNOSIS — F320 Major depressive disorder, single episode, mild: Secondary | ICD-10-CM | POA: Diagnosis not present

## 2019-06-14 DIAGNOSIS — E038 Other specified hypothyroidism: Secondary | ICD-10-CM | POA: Diagnosis not present

## 2019-07-04 ENCOUNTER — Other Ambulatory Visit: Payer: Self-pay

## 2019-07-04 ENCOUNTER — Inpatient Hospital Stay (HOSPITAL_COMMUNITY): Payer: Medicare Other | Attending: Hematology

## 2019-07-04 DIAGNOSIS — E039 Hypothyroidism, unspecified: Secondary | ICD-10-CM | POA: Insufficient documentation

## 2019-07-04 DIAGNOSIS — Z9221 Personal history of antineoplastic chemotherapy: Secondary | ICD-10-CM | POA: Insufficient documentation

## 2019-07-04 DIAGNOSIS — Z8544 Personal history of malignant neoplasm of other female genital organs: Secondary | ICD-10-CM | POA: Insufficient documentation

## 2019-07-04 DIAGNOSIS — C329 Malignant neoplasm of larynx, unspecified: Secondary | ICD-10-CM | POA: Diagnosis not present

## 2019-07-04 DIAGNOSIS — Z79899 Other long term (current) drug therapy: Secondary | ICD-10-CM | POA: Diagnosis not present

## 2019-07-04 DIAGNOSIS — Z923 Personal history of irradiation: Secondary | ICD-10-CM | POA: Insufficient documentation

## 2019-07-04 DIAGNOSIS — Z90722 Acquired absence of ovaries, bilateral: Secondary | ICD-10-CM | POA: Insufficient documentation

## 2019-07-04 LAB — CBC WITH DIFFERENTIAL/PLATELET
Abs Immature Granulocytes: 0.02 10*3/uL (ref 0.00–0.07)
Basophils Absolute: 0 10*3/uL (ref 0.0–0.1)
Basophils Relative: 1 %
Eosinophils Absolute: 0.1 10*3/uL (ref 0.0–0.5)
Eosinophils Relative: 3 %
HCT: 43.1 % (ref 36.0–46.0)
Hemoglobin: 14 g/dL (ref 12.0–15.0)
Immature Granulocytes: 0 %
Lymphocytes Relative: 26 %
Lymphs Abs: 1.4 10*3/uL (ref 0.7–4.0)
MCH: 31.6 pg (ref 26.0–34.0)
MCHC: 32.5 g/dL (ref 30.0–36.0)
MCV: 97.3 fL (ref 80.0–100.0)
Monocytes Absolute: 0.5 10*3/uL (ref 0.1–1.0)
Monocytes Relative: 10 %
Neutro Abs: 3.2 10*3/uL (ref 1.7–7.7)
Neutrophils Relative %: 60 %
Platelets: 176 10*3/uL (ref 150–400)
RBC: 4.43 MIL/uL (ref 3.87–5.11)
RDW: 13 % (ref 11.5–15.5)
WBC: 5.3 10*3/uL (ref 4.0–10.5)
nRBC: 0 % (ref 0.0–0.2)

## 2019-07-04 LAB — COMPREHENSIVE METABOLIC PANEL
ALT: 20 U/L (ref 0–44)
AST: 24 U/L (ref 15–41)
Albumin: 4.3 g/dL (ref 3.5–5.0)
Alkaline Phosphatase: 55 U/L (ref 38–126)
Anion gap: 10 (ref 5–15)
BUN: 22 mg/dL (ref 8–23)
CO2: 27 mmol/L (ref 22–32)
Calcium: 9.4 mg/dL (ref 8.9–10.3)
Chloride: 100 mmol/L (ref 98–111)
Creatinine, Ser: 0.64 mg/dL (ref 0.44–1.00)
GFR calc Af Amer: >60 mL/min (ref >60)
GFR calc non Af Amer: >60 mL/min (ref >60)
Glucose, Bld: 133 mg/dL — ABNORMAL HIGH (ref 70–99)
Potassium: 4.1 mmol/L (ref 3.5–5.1)
Sodium: 137 mmol/L (ref 135–145)
Total Bilirubin: 1 mg/dL (ref 0.3–1.2)
Total Protein: 7.3 g/dL (ref 6.5–8.1)

## 2019-07-04 LAB — TSH: TSH: 5.341 u[IU]/mL — ABNORMAL HIGH (ref 0.350–4.500)

## 2019-07-11 ENCOUNTER — Other Ambulatory Visit: Payer: Self-pay

## 2019-07-11 ENCOUNTER — Inpatient Hospital Stay (HOSPITAL_BASED_OUTPATIENT_CLINIC_OR_DEPARTMENT_OTHER): Payer: Medicare Other | Admitting: Nurse Practitioner

## 2019-07-11 VITALS — BP 132/74 | HR 73 | Temp 97.3°F | Resp 18 | Wt 170.8 lb

## 2019-07-11 DIAGNOSIS — Z90722 Acquired absence of ovaries, bilateral: Secondary | ICD-10-CM | POA: Diagnosis not present

## 2019-07-11 DIAGNOSIS — Z923 Personal history of irradiation: Secondary | ICD-10-CM | POA: Diagnosis not present

## 2019-07-11 DIAGNOSIS — E039 Hypothyroidism, unspecified: Secondary | ICD-10-CM | POA: Diagnosis not present

## 2019-07-11 DIAGNOSIS — C329 Malignant neoplasm of larynx, unspecified: Secondary | ICD-10-CM

## 2019-07-11 DIAGNOSIS — Z9221 Personal history of antineoplastic chemotherapy: Secondary | ICD-10-CM | POA: Diagnosis not present

## 2019-07-11 DIAGNOSIS — Z8544 Personal history of malignant neoplasm of other female genital organs: Secondary | ICD-10-CM | POA: Diagnosis not present

## 2019-07-11 NOTE — Assessment & Plan Note (Signed)
1.  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 Benjamine Mola on 09/03/2018 shows 3 mm mass noted in the left vocal cord with associated glottic gap.  MicroDirect laryngoscopy with extension on 10/10/2018. -Pathology report showed in situ versus invasive squamous cell carcinoma. -PET scan on 11/12/2018 showed mild to focal activity in the posterior aspect of the [place], nonspecific. -She received radiation therapy with Dr. Francesca Jewett and completed on 01/18/2019. -She does not report any difficulty swallowing. -PET scan on 04/23/2019 did not show any evidence of local recurrence.  No hypermetabolic disease in the neck chest abdomen or pelvis. -Labs done on 07/04/2019 were all WNL. -We will follow-up in 3 months with repeat CT scan of the neck.  2.  Hypothyroidism: -She is currently on 25 mcg of Synthroid. -Labs done on 07/04/2019 showed TSH is 5.341. -We will recheck on her next visit  3.  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 -Diagnosed on 01/06/2014, underwent TAH and BSO followed by 6 cycles of adjuvant carboplatin and paclitaxel given in [place]. -She has some residual numbness and tingling in her hands and feet from chemotherapy.

## 2019-07-11 NOTE — Patient Instructions (Signed)
[place] at [facility] Discharge Instructions  Follow up in 3 months with labs and CT SCAN   Thank you for choosing [place] at [facility] to provide your oncology and hematology care.  To afford each patient quality time with our provider, please arrive at least 15 minutes before your scheduled appointment time.   If you have a lab appointment with the [place] please come in thru the Main Entrance and check in at the main information desk.  You need to re-schedule your appointment should you arrive 10 or more minutes late.  We strive to give you quality time with our providers, and arriving late affects you and other patients whose appointments are after yours.  Also, if you no show three or more times for appointments you may be dismissed from the clinic at the providers discretion.     Again, thank you for choosing [facility].  Our hope is that these requests will decrease the amount of time that you wait before being seen by our physicians.       _____________________________________________________________  Should you have questions after your visit to [facility], please contact our office at [phone] between the hours of 8:00 a.m. and 4:30 p.m.  Voicemails left after 4:00 p.m. will not be returned until the following business day.  For prescription refill requests, have your pharmacy contact our office and allow 72 hours.    Due to Covid, you will need to wear a mask upon entering the hospital. If you do not have a mask, a mask will be given to you at the Main Entrance upon arrival. For doctor visits, patients may have 1 support person with them. For treatment visits, patients can not have anyone with them due to social distancing guidelines and our immunocompromised population.

## 2019-07-11 NOTE — Progress Notes (Signed)
[place], [place] [address]   CLINIC:  Medical Oncology/Hematology  PCP:  Neale Burly, MD [address] [id number] [phone]   REASON FOR VISIT: Follow-up for laryngeal squamous cell carcinoma   CURRENT THERAPY: Observation   INTERVAL HISTORY:  Tracy Bowman 86 y.o. female returns for routine follow-up for laryngeal squamous cell carcinoma.  Patient denies any trouble swallowing.  Patient reports overall she feels great.  She does have some swishing sound in her left ear. Denies any nausea, vomiting, or diarrhea. Denies any new pains. Had not noticed any recent bleeding such as epistaxis, hematuria or hematochezia. Denies recent chest pain on exertion, shortness of breath on minimal exertion, pre-syncopal episodes, or palpitations. Denies any numbness or tingling in hands or feet. Denies any recent fevers, infections, or recent hospitalizations. Patient reports appetite at 50% and energy level at 50%.  She is eating well maintain her weight at this time.     REVIEW OF SYSTEMS:  Review of Systems  Constitutional: Positive for fatigue.  Respiratory: Positive for cough.   Psychiatric/Behavioral: Positive for sleep disturbance.  All other systems reviewed and are negative.    PAST MEDICAL/SURGICAL HISTORY:  Past Medical History:  Diagnosis Date  . Anxiety neurosis   . Arthritis    "a touch in my hands" (10/09/2017)  . 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 Chronic atrial fibrillation ([place])   . Chronic diastolic (congestive) heart failure ([place])   . Depression with anxiety   . Hyperlipidemia   . Hypothyroidism   . Vocal cord mass   . Wears glasses    Past Surgical History:  Procedure Laterality Date  . CATARACT EXTRACTION W/ INTRAOCULAR LENS  IMPLANT, BILATERAL Bilateral   . COLECTOMY Left 12/03/2013   hemicolectomy/notes 12/03/2013  .  COLONOSCOPY    . FRACTURE SURGERY    . HIP FRACTURE SURGERY Right 2018; ~ 08/2017  . MICROLARYNGOSCOPY N/A 10/10/2018   Procedure: MICRO DIRECT LARYNGOSCOPY WITH EXCISION OF VOCAL CORD MASS;  Surgeon: Leta Baptist, MD;  Location: [place];  Service: ENT;  Laterality: N/A;  . MULTIPLE TOOTH EXTRACTIONS    . ORIF FEMUR FRACTURE Right 10/07/2017   Procedure: OPEN REDUCTION INTERNAL FIXATION (ORIF) DISTAL FEMUR FRACTURE;  Surgeon: Altamese Harrison, MD;  Location: [place];  Service: Orthopedics;  Laterality: Right;  . TOTAL ABDOMINAL HYSTERECTOMY  12/03/2013   Archie Endo 12/03/2013     SOCIAL HISTORY:  Social History   Socioeconomic History  . Marital status: Married    Spouse name: Francee Piccolo  . Number of children: Not on file  . Years of education: Not on file  . Highest education level: Not on file  Occupational History  . Not on file  Tobacco Use  . Smoking status: Never Smoker  . Smokeless tobacco: Never Used  Vaping Use  . Vaping Use: Never used  Substance and Sexual Activity  . Alcohol use: Never    Alcohol/week: 0.0 standard drinks  . Drug use: Never  . Sexual activity: Not Currently  Other Topics Concern  . Not on file  Social History Narrative  . Not on file   Social Determinants of Health   Financial Resource Strain: Low Risk   . Difficulty of Paying Living Expenses: Not very hard  Food Insecurity: No Food Insecurity  . Worried About Charity fundraiser in the Last Year: Never true  .  Ran Out of Food in the Last Year: Never true  Transportation Needs: No Transportation Needs  . Lack of Transportation (Medical): No  . Lack of Transportation (Non-Medical): No  Physical Activity: Inactive  . Days of Exercise per Week: 0 days  . Minutes of Exercise per Session: 0 min  Stress: No Stress Concern Present  . Feeling of Stress : Only a little  Social Connections: Moderately Isolated  . Frequency of Communication with Friends and Family: Once a week  . Frequency of Social Gatherings with  Friends and Family: Once a week  . Attends Religious Services: 1 to 4 times per year  . Active Member of Clubs or Organizations: No  . Attends Archivist Meetings: Never  . Marital Status: Married  Human resources officer Violence: Not At Risk  . Fear of Current or Ex-Partner: No  . Emotionally Abused: No  . Physically Abused: No  . Sexually Abused: No    FAMILY HISTORY:  Family History  Problem Relation Age of Onset  . Colon cancer Father   . Congestive Heart Failure Mother   . Dementia Brother   . Breast cancer Sister   . Breast cancer Sister   . Healthy Sister     CURRENT MEDICATIONS:  Outpatient Encounter Medications as of 07/11/2019  Medication Sig  . alendronate (FOSAMAX) 70 MG tablet Take 70 mg by mouth once a week.   . ALPRAZolam (XANAX) 0.5 MG tablet Take 1 tablet (0.5 mg total) by mouth at bedtime as needed.  Marland Kitchen aspirin EC 81 MG EC tablet Take 1 tablet (81 mg total) by mouth daily.  . calcium carbonate (CALCIUM 600) 1500 (600 Ca) MG TABS tablet Take 1 tablet by mouth daily.  . Cholecalciferol (VITAMIN D) 50 MCG (2000 UT) tablet Take 2,000 Units by mouth daily.   . CVS VITAMIN B12 1000 MCG tablet Take 1,000 mcg by mouth daily.  Marland Kitchen diltiazem (CARDIZEM) 120 MG tablet Take 120 mg by mouth daily.  . furosemide (LASIX) 20 MG tablet Take 20 mg by mouth daily.   Marland Kitchen levothyroxine (SYNTHROID) 50 MCG tablet Take 0.5 tablets (25 mcg total) by mouth daily before breakfast.  . Magnesium 200 MG TABS Take 1 tablet by mouth daily.  . midodrine (PROAMATINE) 2.5 MG tablet Take 2.5 mg by mouth 2 (two) times daily.  . mirtazapine (REMERON) 15 MG tablet Take 15 mg by mouth at bedtime.   . Multiple Vitamins-Minerals (CVS ONE DAILY ESSENTIAL) TABS Take 1 tablet by mouth daily.  . Omega-3 Fatty Acids (FISH OIL) 1000 MG CAPS Take 1,000 mg by mouth 2 (two) times daily.   . sertraline (ZOLOFT) 50 MG tablet Take 50 mg by mouth daily.  Marland Kitchen acetaminophen (TYLENOL) 325 MG tablet Take 2 tablets (650 mg  total) by mouth every 6 (six) hours as needed. (Patient not taking: Reported on 02/13/2019)  . magic mouthwash w/lidocaine SOLN Take 5 mLs by mouth as needed for mouth pain. (Patient not taking: Reported on 07/11/2019)   No facility-administered encounter medications on file as of 07/11/2019.    ALLERGIES:  Allergies  Allergen Reactions  . Morphine Other (See Comments)    hallucinations   . Oxycodone Other (See Comments)    Pt does not tolerate well extreme dry mouth       PHYSICAL EXAM:  ECOG Performance status: 1  Vitals:   07/11/19 1452  BP: 132/74  Pulse: 73  Resp: 18  Temp: (!) 97.3 F (36.3 C)  SpO2: 97%  Filed Weights   07/11/19 1452  Weight: 170 lb 12.8 oz (77.5 kg)   Physical Exam Constitutional:      Appearance: Normal appearance. She is normal weight.  Cardiovascular:     Rate and Rhythm: Normal rate and regular rhythm.     Heart sounds: Normal heart sounds.  Pulmonary:     Effort: Pulmonary effort is normal.     Breath sounds: Normal breath sounds.  Abdominal:     General: Bowel sounds are normal.     Palpations: Abdomen is soft.  Musculoskeletal:        General: Normal range of motion.  Skin:    General: Skin is warm.  Neurological:     Mental Status: She is alert and oriented to person, place, and time. Mental status is at baseline.  Psychiatric:        Mood and Affect: Mood normal.        Behavior: Behavior normal.        Thought Content: Thought content normal.        Judgment: Judgment normal.      LABORATORY DATA:  I have reviewed the labs as listed.  CBC    Component Value Date/Time   WBC 5.3 07/04/2019 1116   RBC 4.43 07/04/2019 1116   HGB 14.0 07/04/2019 1116   HCT 43.1 07/04/2019 1116   PLT 176 07/04/2019 1116   MCV 97.3 07/04/2019 1116   MCH 31.6 07/04/2019 1116   MCHC 32.5 07/04/2019 1116   RDW 13.0 07/04/2019 1116   LYMPHSABS 1.4 07/04/2019 1116   MONOABS 0.5 07/04/2019 1116   EOSABS 0.1 07/04/2019 1116   BASOSABS 0.0  07/04/2019 1116   CMP Latest Ref Rng & Units 07/04/2019 04/25/2019 10/26/2018  Glucose 70 - 99 mg/dL 133(H) 187(H) 123(H)  BUN 8 - 23 mg/dL 22 19 21   Creatinine 0.44 - 1.00 mg/dL 0.64 0.69 0.66  Sodium 135 - 145 mmol/L 137 140 139  Potassium 3.5 - 5.1 mmol/L 4.1 4.0 4.1  Chloride 98 - 111 mmol/L 100 100 104  CO2 22 - 32 mmol/L 27 29 25   Calcium 8.9 - 10.3 mg/dL 9.4 9.1 9.0  Total Protein 6.5 - 8.1 g/dL 7.3 7.3 7.4  Total Bilirubin 0.3 - 1.2 mg/dL 1.0 1.1 1.6(H)  Alkaline Phos 38 - 126 U/L 55 59 63  AST 15 - 41 U/L 24 23 25   ALT 0 - 44 U/L 20 20 22     All questions were answered to patient's stated satisfaction. Encouraged patient to call with any new concerns or questions before his next visit to the cancer center and we can certain see him sooner, if needed.     ASSESSMENT & PLAN:  Laryngeal squamous cell carcinoma ([place]) 1.  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 Benjamine Mola on 09/03/2018 shows 3 mm mass noted in the left vocal cord with associated glottic gap.  MicroDirect laryngoscopy with extension on 10/10/2018. -Pathology report showed in situ versus invasive squamous cell carcinoma. -PET scan on 11/12/2018 showed mild to focal activity in the posterior aspect of the [place], nonspecific. -She received radiation therapy with Dr. Francesca Jewett and completed on 01/18/2019. -She does not report any difficulty swallowing. -PET scan on 04/23/2019 did not show any evidence of local recurrence.  No hypermetabolic disease in the neck chest abdomen or pelvis. -Labs done on 07/04/2019 were all WNL. -We will follow-up in 3 months with repeat CT scan of the neck.  2.  Hypothyroidism: -She is  currently on 25 mcg of Synthroid. -Labs done on 07/04/2019 showed TSH is 5.341. -We will recheck on her next visit  3.  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 -Diagnosed on 01/06/2014, underwent TAH and BSO followed by 6 cycles of adjuvant carboplatin and paclitaxel given in  [place]. -She has some residual numbness and tingling in her hands and feet from chemotherapy.     Orders placed this encounter:  Orders Placed This Encounter  Procedures  . CT SOFT TISSUE NECK W CONTRAST  . Lactate dehydrogenase  . TSH  . CBC with Differential/Platelet  . Comprehensive metabolic panel      Francene Finders, FNP-C [place] [phone]

## 2019-08-07 DIAGNOSIS — M160 Bilateral primary osteoarthritis of hip: Secondary | ICD-10-CM | POA: Diagnosis not present

## 2019-08-07 DIAGNOSIS — S72121D Displaced fracture of lesser trochanter of right femur, subsequent encounter for closed fracture with routine healing: Secondary | ICD-10-CM | POA: Diagnosis not present

## 2019-08-07 DIAGNOSIS — S72141D Displaced intertrochanteric fracture of right femur, subsequent encounter for closed fracture with routine healing: Secondary | ICD-10-CM | POA: Diagnosis not present

## 2019-08-07 DIAGNOSIS — M47817 Spondylosis without myelopathy or radiculopathy, lumbosacral region: Secondary | ICD-10-CM | POA: Diagnosis not present

## 2019-08-07 DIAGNOSIS — Z9889 Other specified postprocedural states: Secondary | ICD-10-CM | POA: Diagnosis not present

## 2019-08-07 DIAGNOSIS — M461 Sacroiliitis, not elsewhere classified: Secondary | ICD-10-CM | POA: Diagnosis not present

## 2019-08-07 DIAGNOSIS — C5701 Malignant neoplasm of right fallopian tube: Secondary | ICD-10-CM | POA: Diagnosis not present

## 2019-08-07 DIAGNOSIS — M85852 Other specified disorders of bone density and structure, left thigh: Secondary | ICD-10-CM | POA: Diagnosis not present

## 2019-08-07 DIAGNOSIS — M25512 Pain in left shoulder: Secondary | ICD-10-CM | POA: Diagnosis not present

## 2019-08-12 DIAGNOSIS — I10 Essential (primary) hypertension: Secondary | ICD-10-CM | POA: Diagnosis not present

## 2019-08-12 DIAGNOSIS — F320 Major depressive disorder, single episode, mild: Secondary | ICD-10-CM | POA: Diagnosis not present

## 2019-08-12 DIAGNOSIS — E038 Other specified hypothyroidism: Secondary | ICD-10-CM | POA: Diagnosis not present

## 2019-08-12 DIAGNOSIS — K2100 Gastro-esophageal reflux disease with esophagitis, without bleeding: Secondary | ICD-10-CM | POA: Diagnosis not present

## 2019-08-19 DIAGNOSIS — Z923 Personal history of irradiation: Secondary | ICD-10-CM | POA: Diagnosis not present

## 2019-08-19 DIAGNOSIS — C329 Malignant neoplasm of larynx, unspecified: Secondary | ICD-10-CM | POA: Diagnosis not present

## 2019-08-26 ENCOUNTER — Other Ambulatory Visit (HOSPITAL_COMMUNITY): Payer: Self-pay | Admitting: Hematology

## 2019-09-06 DIAGNOSIS — I10 Essential (primary) hypertension: Secondary | ICD-10-CM | POA: Diagnosis not present

## 2019-09-06 DIAGNOSIS — E038 Other specified hypothyroidism: Secondary | ICD-10-CM | POA: Diagnosis not present

## 2019-09-06 DIAGNOSIS — K2100 Gastro-esophageal reflux disease with esophagitis, without bleeding: Secondary | ICD-10-CM | POA: Diagnosis not present

## 2019-09-06 DIAGNOSIS — F320 Major depressive disorder, single episode, mild: Secondary | ICD-10-CM | POA: Diagnosis not present

## 2019-09-16 DIAGNOSIS — C562 Malignant neoplasm of left ovary: Secondary | ICD-10-CM | POA: Diagnosis not present

## 2019-09-16 DIAGNOSIS — R918 Other nonspecific abnormal finding of lung field: Secondary | ICD-10-CM | POA: Diagnosis not present

## 2019-09-16 DIAGNOSIS — R933 Abnormal findings on diagnostic imaging of other parts of digestive tract: Secondary | ICD-10-CM | POA: Diagnosis not present

## 2019-09-17 DIAGNOSIS — Z8521 Personal history of malignant neoplasm of larynx: Secondary | ICD-10-CM | POA: Diagnosis not present

## 2019-10-02 DIAGNOSIS — E038 Other specified hypothyroidism: Secondary | ICD-10-CM | POA: Diagnosis not present

## 2019-10-02 DIAGNOSIS — F320 Major depressive disorder, single episode, mild: Secondary | ICD-10-CM | POA: Diagnosis not present

## 2019-10-02 DIAGNOSIS — K2100 Gastro-esophageal reflux disease with esophagitis, without bleeding: Secondary | ICD-10-CM | POA: Diagnosis not present

## 2019-10-02 DIAGNOSIS — I10 Essential (primary) hypertension: Secondary | ICD-10-CM | POA: Diagnosis not present

## 2019-10-07 DIAGNOSIS — C5701 Malignant neoplasm of right fallopian tube: Secondary | ICD-10-CM | POA: Diagnosis not present

## 2019-10-07 DIAGNOSIS — Z23 Encounter for immunization: Secondary | ICD-10-CM | POA: Diagnosis not present

## 2019-10-07 DIAGNOSIS — C562 Malignant neoplasm of left ovary: Secondary | ICD-10-CM | POA: Diagnosis not present

## 2019-10-10 ENCOUNTER — Other Ambulatory Visit (HOSPITAL_COMMUNITY): Payer: Self-pay

## 2019-10-10 DIAGNOSIS — C329 Malignant neoplasm of larynx, unspecified: Secondary | ICD-10-CM

## 2019-10-11 ENCOUNTER — Inpatient Hospital Stay (HOSPITAL_COMMUNITY): Payer: Medicare Other | Attending: Hematology

## 2019-10-11 ENCOUNTER — Ambulatory Visit (HOSPITAL_COMMUNITY): Payer: Medicare Other

## 2019-10-11 DIAGNOSIS — Z7982 Long term (current) use of aspirin: Secondary | ICD-10-CM | POA: Insufficient documentation

## 2019-10-11 DIAGNOSIS — Z8249 Family history of ischemic heart disease and other diseases of the circulatory system: Secondary | ICD-10-CM | POA: Insufficient documentation

## 2019-10-11 DIAGNOSIS — Z923 Personal history of irradiation: Secondary | ICD-10-CM | POA: Insufficient documentation

## 2019-10-11 DIAGNOSIS — Z9049 Acquired absence of other specified parts of digestive tract: Secondary | ICD-10-CM | POA: Insufficient documentation

## 2019-10-11 DIAGNOSIS — Z79899 Other long term (current) drug therapy: Secondary | ICD-10-CM | POA: Insufficient documentation

## 2019-10-11 DIAGNOSIS — E785 Hyperlipidemia, unspecified: Secondary | ICD-10-CM | POA: Insufficient documentation

## 2019-10-11 DIAGNOSIS — I5032 Chronic diastolic (congestive) heart failure: Secondary | ICD-10-CM | POA: Insufficient documentation

## 2019-10-11 DIAGNOSIS — Z8544 Personal history of malignant neoplasm of other female genital organs: Secondary | ICD-10-CM | POA: Insufficient documentation

## 2019-10-11 DIAGNOSIS — Z90722 Acquired absence of ovaries, bilateral: Secondary | ICD-10-CM | POA: Insufficient documentation

## 2019-10-11 DIAGNOSIS — Z800 Family history of malignant neoplasm of digestive organs: Secondary | ICD-10-CM | POA: Insufficient documentation

## 2019-10-11 DIAGNOSIS — Z9079 Acquired absence of other genital organ(s): Secondary | ICD-10-CM | POA: Insufficient documentation

## 2019-10-11 DIAGNOSIS — E890 Postprocedural hypothyroidism: Secondary | ICD-10-CM | POA: Insufficient documentation

## 2019-10-11 DIAGNOSIS — Z90710 Acquired absence of both cervix and uterus: Secondary | ICD-10-CM | POA: Insufficient documentation

## 2019-10-11 DIAGNOSIS — I482 Chronic atrial fibrillation, unspecified: Secondary | ICD-10-CM | POA: Insufficient documentation

## 2019-10-11 DIAGNOSIS — Z85818 Personal history of malignant neoplasm of other sites of lip, oral cavity, and pharynx: Secondary | ICD-10-CM | POA: Insufficient documentation

## 2019-10-11 DIAGNOSIS — Z803 Family history of malignant neoplasm of breast: Secondary | ICD-10-CM | POA: Insufficient documentation

## 2019-10-12 ENCOUNTER — Ambulatory Visit (HOSPITAL_COMMUNITY): Admission: RE | Admit: 2019-10-12 | Payer: Medicare Other | Source: Home / Self Care

## 2019-10-12 ENCOUNTER — Other Ambulatory Visit: Payer: Self-pay

## 2019-10-12 ENCOUNTER — Ambulatory Visit (HOSPITAL_COMMUNITY)
Admission: RE | Admit: 2019-10-12 | Discharge: 2019-10-12 | Disposition: A | Discharge status: Home / Self Care | Payer: Medicare Other | Source: Ambulatory Visit | Attending: Nurse Practitioner | Admitting: Nurse Practitioner

## 2019-10-12 DIAGNOSIS — C329 Malignant neoplasm of larynx, unspecified: Secondary | ICD-10-CM

## 2019-10-12 DIAGNOSIS — C320 Malignant neoplasm of glottis: Secondary | ICD-10-CM | POA: Diagnosis not present

## 2019-10-12 LAB — POCT I-STAT CREATININE: Creatinine, Ser: 0.7 mg/dL (ref 0.44–1.00)

## 2019-10-12 MED ORDER — IOHEXOL 300 MG/ML  SOLN
75.0000 mL | Freq: Once | INTRAMUSCULAR | Status: AC | PRN
  Administered 2019-10-12: 75 mL via INTRAVENOUS

## 2019-10-14 ENCOUNTER — Other Ambulatory Visit (HOSPITAL_COMMUNITY): Payer: Medicare Other

## 2019-10-17 ENCOUNTER — Other Ambulatory Visit: Payer: Self-pay

## 2019-10-17 ENCOUNTER — Inpatient Hospital Stay (HOSPITAL_BASED_OUTPATIENT_CLINIC_OR_DEPARTMENT_OTHER): Payer: Medicare Other | Admitting: Hematology

## 2019-10-17 VITALS — BP 144/68 | HR 63 | Temp 98.0°F | Resp 18 | Wt 165.9 lb

## 2019-10-17 DIAGNOSIS — I482 Chronic atrial fibrillation, unspecified: Secondary | ICD-10-CM | POA: Diagnosis not present

## 2019-10-17 DIAGNOSIS — E890 Postprocedural hypothyroidism: Secondary | ICD-10-CM | POA: Diagnosis not present

## 2019-10-17 DIAGNOSIS — Z90710 Acquired absence of both cervix and uterus: Secondary | ICD-10-CM | POA: Diagnosis not present

## 2019-10-17 DIAGNOSIS — Z7982 Long term (current) use of aspirin: Secondary | ICD-10-CM | POA: Diagnosis not present

## 2019-10-17 DIAGNOSIS — Z85818 Personal history of malignant neoplasm of other sites of lip, oral cavity, and pharynx: Secondary | ICD-10-CM | POA: Diagnosis not present

## 2019-10-17 DIAGNOSIS — Z923 Personal history of irradiation: Secondary | ICD-10-CM | POA: Diagnosis not present

## 2019-10-17 DIAGNOSIS — Z9079 Acquired absence of other genital organ(s): Secondary | ICD-10-CM | POA: Diagnosis not present

## 2019-10-17 DIAGNOSIS — Z800 Family history of malignant neoplasm of digestive organs: Secondary | ICD-10-CM | POA: Diagnosis not present

## 2019-10-17 DIAGNOSIS — Z9049 Acquired absence of other specified parts of digestive tract: Secondary | ICD-10-CM | POA: Diagnosis not present

## 2019-10-17 DIAGNOSIS — Z79899 Other long term (current) drug therapy: Secondary | ICD-10-CM | POA: Diagnosis not present

## 2019-10-17 DIAGNOSIS — C329 Malignant neoplasm of larynx, unspecified: Secondary | ICD-10-CM | POA: Diagnosis not present

## 2019-10-17 DIAGNOSIS — Z8544 Personal history of malignant neoplasm of other female genital organs: Secondary | ICD-10-CM | POA: Diagnosis not present

## 2019-10-17 DIAGNOSIS — Z803 Family history of malignant neoplasm of breast: Secondary | ICD-10-CM | POA: Diagnosis not present

## 2019-10-17 DIAGNOSIS — Z90722 Acquired absence of ovaries, bilateral: Secondary | ICD-10-CM | POA: Diagnosis not present

## 2019-10-17 DIAGNOSIS — E785 Hyperlipidemia, unspecified: Secondary | ICD-10-CM | POA: Diagnosis not present

## 2019-10-17 DIAGNOSIS — I5032 Chronic diastolic (congestive) heart failure: Secondary | ICD-10-CM | POA: Diagnosis not present

## 2019-10-17 DIAGNOSIS — Z8249 Family history of ischemic heart disease and other diseases of the circulatory system: Secondary | ICD-10-CM | POA: Diagnosis not present

## 2019-10-17 NOTE — Progress Notes (Signed)
Tracy Bowman, [place] [address]   CLINIC:  Medical Oncology/Hematology  PCP:  Neale Burly, MD [place] / [place] [address] [phone]   REASON FOR VISIT:  Follow-up for left vocal cord cancer  PRIOR THERAPY: Laryngoscopy with excision on 10/10/2018  NGS Results: Not done  CURRENT THERAPY: Observation  BRIEF ONCOLOGIC HISTORY:  Oncology History   No history exists.    CANCER STAGING: Cancer Staging No matching staging information was found for the patient.  INTERVAL HISTORY:  Tracy Bowman, a 86 y.o. female, returns for routine follow-up of her left vocal cord cancer. [place] was last seen on 04/25/2019.  Today she is accompanied by her husband. She reports feeling good. She reports seeing flashing lights and her hands were moving without control, but denies having migraine headaches. She reports having several similar episodes. She has never had seizures before.   REVIEW OF SYSTEMS:  Review of Systems  Constitutional: Positive for appetite change (50%) and fatigue (50%).  Eyes: Positive for eye problems (sees flashing lights).  Neurological: Negative for headaches and seizures.  All other systems reviewed and are negative.   PAST MEDICAL/SURGICAL HISTORY:  Past Medical History:  Diagnosis Date  . Anxiety neurosis   . Arthritis    "a touch in my hands" (10/09/2017)  . 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 Chronic atrial fibrillation ([place])   . Chronic diastolic (congestive) heart failure ([place])   . Depression with anxiety   . Hyperlipidemia   . Hypothyroidism   . Vocal cord mass   . Wears glasses    Past Surgical History:  Procedure Laterality Date  . CATARACT EXTRACTION W/ INTRAOCULAR LENS  IMPLANT, BILATERAL Bilateral   . COLECTOMY Left 12/03/2013   hemicolectomy/notes 12/03/2013  . COLONOSCOPY    . FRACTURE SURGERY    . HIP  FRACTURE SURGERY Right 2018; ~ 08/2017  . MICROLARYNGOSCOPY N/A 10/10/2018   Procedure: MICRO DIRECT LARYNGOSCOPY WITH EXCISION OF VOCAL CORD MASS;  Surgeon: Leta Baptist, MD;  Location: [place];  Service: ENT;  Laterality: N/A;  . MULTIPLE TOOTH EXTRACTIONS    . ORIF FEMUR FRACTURE Right 10/07/2017   Procedure: OPEN REDUCTION INTERNAL FIXATION (ORIF) DISTAL FEMUR FRACTURE;  Surgeon: Altamese [place], MD;  Location: [place];  Service: Orthopedics;  Laterality: Right;  . TOTAL ABDOMINAL HYSTERECTOMY  12/03/2013   Archie Endo 12/03/2013    SOCIAL HISTORY:  Social History   Socioeconomic History  . Marital status: Married    Spouse name: Francee Piccolo  . Number of children: Not on file  . Years of education: Not on file  . Highest education level: Not on file  Occupational History  . Not on file  Tobacco Use  . Smoking status: Never Smoker  . Smokeless tobacco: Never Used  Vaping Use  . Vaping Use: Never used  Substance and Sexual Activity  . Alcohol use: Never    Alcohol/week: 0.0 standard drinks  . Drug use: Never  . Sexual activity: Not Currently  Other Topics Concern  . Not on file  Social History Narrative  . Not on file   Social Determinants of Health   Financial Resource Strain: Low Risk   . Difficulty of Paying Living Expenses: Not very hard  Food Insecurity: No Food Insecurity  . Worried About Charity fundraiser in the Last Year: Never true  . Ran Out  of Food in the Last Year: Never true  Transportation Needs: No Transportation Needs  . Lack of Transportation (Medical): No  . Lack of Transportation (Non-Medical): No  Physical Activity: Inactive  . Days of Exercise per Week: 0 days  . Minutes of Exercise per Session: 0 min  Stress: No Stress Concern Present  . Feeling of Stress : Only a little  Social Connections: Moderately Isolated  . Frequency of Communication with Friends and Family: Once a week  . Frequency of Social Gatherings with Friends and Family: Once a week  . Attends  Religious Services: 1 to 4 times per year  . Active Member of Clubs or Organizations: No  . Attends Archivist Meetings: Never  . Marital Status: Married  Human resources officer Violence: Not At Risk  . Fear of Current or Ex-Partner: No  . Emotionally Abused: No  . Physically Abused: No  . Sexually Abused: No    FAMILY HISTORY:  Family History  Problem Relation Age of Onset  . Colon cancer Father   . Congestive Heart Failure Mother   . Dementia Brother   . Breast cancer Sister   . Breast cancer Sister   . Healthy Sister     CURRENT MEDICATIONS:  Current Outpatient Medications  Medication Sig Dispense Refill  . alendronate (FOSAMAX) 70 MG tablet Take 70 mg by mouth once a week.   0  . aspirin EC 81 MG EC tablet Take 1 tablet (81 mg total) by mouth daily.    . calcium carbonate (CALCIUM 600) 1500 (600 Ca) MG TABS tablet Take 1 tablet by mouth daily.    . Cholecalciferol (VITAMIN D) 50 MCG (2000 UT) tablet Take 2,000 Units by mouth daily.     . CVS VITAMIN B12 1000 MCG tablet Take 1,000 mcg by mouth daily.  0  . diltiazem (CARDIZEM) 120 MG tablet Take 120 mg by mouth daily.    . furosemide (LASIX) 20 MG tablet Take 20 mg by mouth daily.     Marland Kitchen levothyroxine (SYNTHROID) 25 MCG tablet Take 25 mcg by mouth daily.    . Magnesium 200 MG TABS Take 1 tablet by mouth daily.  0  . midodrine (PROAMATINE) 2.5 MG tablet Take 2.5 mg by mouth 2 (two) times daily.  2  . mirtazapine (REMERON) 15 MG tablet Take 15 mg by mouth at bedtime.     . Multiple Vitamins-Minerals (CVS ONE DAILY ESSENTIAL) TABS Take 1 tablet by mouth daily.  0  . Omega-3 Fatty Acids (FISH OIL) 1000 MG CAPS Take 1,000 mg by mouth 2 (two) times daily.     . sertraline (ZOLOFT) 50 MG tablet Take 50 mg by mouth daily.  0  . acetaminophen (TYLENOL) 325 MG tablet Take 2 tablets (650 mg total) by mouth every 6 (six) hours as needed. (Patient not taking: Reported on 10/17/2019)    . ALPRAZolam (XANAX) 0.5 MG tablet Take 1 tablet  (0.5 mg total) by mouth at bedtime as needed. (Patient not taking: Reported on 10/17/2019) 8 tablet 0  . magic mouthwash w/lidocaine SOLN Take 5 mLs by mouth as needed for mouth pain.  (Patient not taking: Reported on 10/17/2019)    . traMADol (ULTRAM) 50 MG tablet Take 50 mg by mouth 2 (two) times daily as needed. (Patient not taking: Reported on 10/17/2019)     No current facility-administered medications for this visit.    ALLERGIES:  Allergies  Allergen Reactions  . Morphine Other (See Comments)    hallucinations   .  Oxycodone Other (See Comments)    Pt does not tolerate well extreme dry mouth      PHYSICAL EXAM:  Performance status (ECOG): 1 - Symptomatic but completely ambulatory  Vitals:   10/17/19 1516  BP: (!) 144/68  Pulse: 63  Resp: 18  Temp: 98 F (36.7 C)  SpO2: 96%   Wt Readings from Last 3 Encounters:  10/17/19 165 lb 14.4 oz (75.3 kg)  07/11/19 170 lb 12.8 oz (77.5 kg)  04/25/19 169 lb 8 oz (76.9 kg)   Physical Exam Vitals reviewed.  Constitutional:      Appearance: Normal appearance.  Cardiovascular:     Rate and Rhythm: Normal rate and regular rhythm.     Pulses: Normal pulses.     Heart sounds: Normal heart sounds.  Pulmonary:     Effort: Pulmonary effort is normal.     Breath sounds: Normal breath sounds.  Lymphadenopathy:     Cervical: No cervical adenopathy.     Upper Body:     Right upper body: No supraclavicular adenopathy.     Left upper body: No supraclavicular adenopathy.  Neurological:     General: No focal deficit present.     Mental Status: She is alert and oriented to person, place, and time.  Psychiatric:        Mood and Affect: Mood normal.        Behavior: Behavior normal.      LABORATORY DATA:  I have reviewed the labs as listed.  CBC Latest Ref Rng & Units 07/04/2019 04/25/2019 10/26/2018  WBC 4.0 - 10.5 K/uL 5.3 5.3 5.8  Hemoglobin 12.0 - 15.0 g/dL 14.0 14.4 14.7  Hematocrit 36 - 46 % 43.1 45.0 46.0  Platelets 150 -  400 K/uL 176 155 155   CMP Latest Ref Rng & Units 10/12/2019 07/04/2019 04/25/2019  Glucose 70 - 99 mg/dL - 133(H) 187(H)  BUN 8 - 23 mg/dL - 22 19  Creatinine 0.44 - 1.00 mg/dL 0.70 0.64 0.69  Sodium 135 - 145 mmol/L - 137 140  Potassium 3.5 - 5.1 mmol/L - 4.1 4.0  Chloride 98 - 111 mmol/L - 100 100  CO2 22 - 32 mmol/L - 27 29  Calcium 8.9 - 10.3 mg/dL - 9.4 9.1  Total Protein 6.5 - 8.1 g/dL - 7.3 7.3  Total Bilirubin 0.3 - 1.2 mg/dL - 1.0 1.1  Alkaline Phos 38 - 126 U/L - 55 59  AST 15 - 41 U/L - 24 23  ALT 0 - 44 U/L - 20 20    DIAGNOSTIC IMAGING:  I have independently reviewed the scans and discussed with the patient. CT SOFT TISSUE NECK W CONTRAST  Result Date: 10/13/2019 CLINICAL DATA:  Follow-up squamous cell carcinoma the left vocal cord. EXAM: CT NECK WITH CONTRAST TECHNIQUE: Multidetector CT imaging of the neck was performed using the standard protocol following the bolus administration of intravenous contrast. CONTRAST:  24mL OMNIPAQUE IOHEXOL 300 MG/ML  SOLN COMPARISON:  PET scan 04/23/2019.  PET scan 11/12/2018. FINDINGS: Pharynx and larynx: No mucosal or submucosal lesion. The laryngeal region appears normal presently. No residual or recurrent tumor. No evidence of cord paresis or paralysis. Salivary glands: Parotid and submandibular glands are normal. Thyroid: Normal Lymph nodes: No enlarged or low-density nodes on either side of the neck. Vascular: Normal Limited intracranial: Normal Visualized orbits: Normal Mastoids and visualized paranasal sinuses: Clear Skeleton: Ordinary cervical spondylosis. Incidental exostosis of the left anterior mandible. Upper chest: Negative Other: None IMPRESSION: No evidence of  residual or recurrent tumor at the laryngeal region. No evidence of regional metastatic disease. Electronically Signed   By: Nelson Chimes M.D.   On: 10/13/2019 07:42     ASSESSMENT:  1.  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 Benjamine Mola on 09/03/2018  shows 3 mm mass noted in the left vocal cord with associated glottic gap.  MicroDirect laryngoscopy with excision on 10/10/2018. -Pathology report showed in situ versus invasive squamous cell carcinoma. -PET scan on 11/12/2018 showed mild focal activity in the posterior aspect of the larynx, nonspecific. -She received radiation therapy with Dr.Yanagihara and completed on 01/18/2019. -PET scan on 04/23/2019 did not show any evidence of recurrence.  2.  Hypothyroidism: -This is from radiation therapy to the neck.  3.  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 -She has some residual numbness/tingling in the hands from chemotherapy.   PLAN:  1.  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 -Reviewed CT neck from 10/12/2019 which did not show any residual or recurrent tumor in the laryngeal region. -She has followed up with Dr. Benjamine Mola last month and fiberoptic exam reportedly was normal. -RTC in 6 months with CT soft tissue neck. -She reported flickering lights on and off over the last 1 month.  She does not have any at this time.  I have recommended her to get evaluated in the ER if it recurs again.  I have also told her to talk to her PMD.  2.  Hypothyroidism: -Continue Synthroid 50 mcg daily.  Follow-up TSH with PMD.  I will check TSH in 6 months.  3.  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    Orders placed this encounter:  No orders of the defined types were placed in this encounter.    Derek Jack, MD [place] [phone]   I, Milinda Antis, am acting as a scribe for Dr. Sanda Linger.  I, Derek Jack MD, have reviewed the above documentation for  accuracy and completeness, and I agree with the above.

## 2019-10-17 NOTE — Patient Instructions (Addendum)
[place] at [facility] Discharge Instructions  You were seen today by Dr. Delton Coombes. He went over your recent results. You will be scheduled for a CT scan of your neck before your next visit. Dr. Delton Coombes will see you back in 6 months for labs and follow up.   Thank you for choosing [place] at [facility] to provide your oncology and hematology care.  To afford each patient quality time with our provider, please arrive at least 15 minutes before your scheduled appointment time.   If you have a lab appointment with the [facility] please come in thru the Main Entrance and check in at the main information desk  You need to re-schedule your appointment should you arrive 10 or more minutes late.  We strive to give you quality time with our providers, and arriving late affects you and other patients whose appointments are after yours.  Also, if you no show three or more times for appointments you may be dismissed from the clinic at the providers discretion.     Again, thank you for choosing [facility].  Our hope is that these requests will decrease the amount of time that you wait before being seen by our physicians.       _____________________________________________________________  Should you have questions after your visit to [facility], please contact our office at [phone] between the hours of 8:00 a.m. and 4:30 p.m.  Voicemails left after 4:00 p.m. will not be returned until the following business day.  For prescription refill requests, have your pharmacy contact our office and allow 72 hours.    Cancer Center Support Programs:   > Cancer Support Group  2nd Tuesday of the month 1pm-2pm, Journey Room

## 2019-10-24 DIAGNOSIS — K2100 Gastro-esophageal reflux disease with esophagitis, without bleeding: Secondary | ICD-10-CM | POA: Diagnosis not present

## 2019-10-24 DIAGNOSIS — E038 Other specified hypothyroidism: Secondary | ICD-10-CM | POA: Diagnosis not present

## 2019-10-24 DIAGNOSIS — I10 Essential (primary) hypertension: Secondary | ICD-10-CM | POA: Diagnosis not present

## 2019-10-24 DIAGNOSIS — F320 Major depressive disorder, single episode, mild: Secondary | ICD-10-CM | POA: Diagnosis not present

## 2019-10-29 DIAGNOSIS — H5203 Hypermetropia, bilateral: Secondary | ICD-10-CM | POA: Diagnosis not present

## 2019-10-29 DIAGNOSIS — H52223 Regular astigmatism, bilateral: Secondary | ICD-10-CM | POA: Diagnosis not present

## 2019-10-29 DIAGNOSIS — Z9849 Cataract extraction status, unspecified eye: Secondary | ICD-10-CM | POA: Diagnosis not present

## 2019-10-29 DIAGNOSIS — H43813 Vitreous degeneration, bilateral: Secondary | ICD-10-CM | POA: Diagnosis not present

## 2019-10-29 DIAGNOSIS — Z961 Presence of intraocular lens: Secondary | ICD-10-CM | POA: Diagnosis not present

## 2019-10-29 DIAGNOSIS — H524 Presbyopia: Secondary | ICD-10-CM | POA: Diagnosis not present

## 2019-11-10 DIAGNOSIS — Z23 Encounter for immunization: Secondary | ICD-10-CM | POA: Diagnosis not present

## 2019-11-19 DIAGNOSIS — C329 Malignant neoplasm of larynx, unspecified: Secondary | ICD-10-CM | POA: Diagnosis not present

## 2019-11-19 DIAGNOSIS — Z923 Personal history of irradiation: Secondary | ICD-10-CM | POA: Diagnosis not present

## 2019-11-29 DIAGNOSIS — E038 Other specified hypothyroidism: Secondary | ICD-10-CM | POA: Diagnosis not present

## 2019-11-29 DIAGNOSIS — F320 Major depressive disorder, single episode, mild: Secondary | ICD-10-CM | POA: Diagnosis not present

## 2019-11-29 DIAGNOSIS — K2100 Gastro-esophageal reflux disease with esophagitis, without bleeding: Secondary | ICD-10-CM | POA: Diagnosis not present

## 2019-11-29 DIAGNOSIS — I10 Essential (primary) hypertension: Secondary | ICD-10-CM | POA: Diagnosis not present

## 2019-12-23 DIAGNOSIS — K2100 Gastro-esophageal reflux disease with esophagitis, without bleeding: Secondary | ICD-10-CM | POA: Diagnosis not present

## 2019-12-23 DIAGNOSIS — F320 Major depressive disorder, single episode, mild: Secondary | ICD-10-CM | POA: Diagnosis not present

## 2019-12-23 DIAGNOSIS — I10 Essential (primary) hypertension: Secondary | ICD-10-CM | POA: Diagnosis not present

## 2019-12-23 DIAGNOSIS — E038 Other specified hypothyroidism: Secondary | ICD-10-CM | POA: Diagnosis not present

## 2020-01-09 DIAGNOSIS — K2100 Gastro-esophageal reflux disease with esophagitis, without bleeding: Secondary | ICD-10-CM | POA: Diagnosis not present

## 2020-01-09 DIAGNOSIS — F320 Major depressive disorder, single episode, mild: Secondary | ICD-10-CM | POA: Diagnosis not present

## 2020-01-09 DIAGNOSIS — E038 Other specified hypothyroidism: Secondary | ICD-10-CM | POA: Diagnosis not present

## 2020-01-09 DIAGNOSIS — I10 Essential (primary) hypertension: Secondary | ICD-10-CM | POA: Diagnosis not present

## 2020-02-17 NOTE — Progress Notes (Deleted)
Cardiology Office Note    Date:  02/17/2020   ID:  Tracy Bowman, DOB [birth year], MRN [id number]   PCP:  Neale Burly, MD   [place]  Cardiologist:  Kate Sable, MD (Inactive)  Advanced Practice Provider:  No care team member to display Electrophysiologist:  None  {Press F2 to show EP APP, CHF, sleep or structural heart MD               :[id number]}  { Click here to update then REFRESH NOTE - MD (PCP) or APP (Team Member)  Change PCP Type for MD, Specialty for APP is either Cardiology or Clinical Cardiac Electrophysiology  :[id number]}   No chief complaint on file.   History of Present Illness:  Tracy Bowman is a 87 y.o. female with history of permanent atrial fibrillation not on anticoagulation because of falls and reluctance to take, hypertension, lower extremity edema  Patient last had a telemedicine visit 01/2019 and was doing well    Past Medical History:  Diagnosis Date  . Anxiety neurosis   . Arthritis    "a touch in my hands" (10/09/2017)  . 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 Chronic atrial fibrillation ([place])   . Chronic diastolic (congestive) heart failure ([place])   . Depression with anxiety   . Hyperlipidemia   . Hypothyroidism   . Vocal cord mass   . Wears glasses     Past Surgical History:  Procedure Laterality Date  . CATARACT EXTRACTION W/ INTRAOCULAR LENS  IMPLANT, BILATERAL Bilateral   . COLECTOMY Left 12/03/2013   hemicolectomy/notes 12/03/2013  . COLONOSCOPY    . FRACTURE SURGERY    . HIP FRACTURE SURGERY Right 2018; ~ 08/2017  . MICROLARYNGOSCOPY N/A 10/10/2018   Procedure: MICRO DIRECT LARYNGOSCOPY WITH EXCISION OF VOCAL CORD MASS;  Surgeon: Leta Baptist, MD;  Location: [facility];  Service: ENT;  Laterality: N/A;  . MULTIPLE TOOTH EXTRACTIONS    . ORIF FEMUR FRACTURE Right 10/07/2017   Procedure: OPEN REDUCTION INTERNAL FIXATION (ORIF) DISTAL  FEMUR FRACTURE;  Surgeon: Altamese San Felipe P[place]blo, MD;  Location: [facility];  Service: Orthopedics;  Laterality: Right;  . TOTAL ABDOMINAL HYSTERECTOMY  12/03/2013   Archie Endo 12/03/2013    Current Medications: No outpatient medications have been marked as taking for the 02/19/20 encounter (Appointment) with Imogene Burn, PA-C.     Allergies:   Morphine and Oxycodone   Social History   Socioeconomic History  . Marital status: Married    Spouse name: Francee Piccolo  . Number of children: Not on file  . Years of education: Not on file  . Highest education level: Not on file  Occupational History  . Not on file  Tobacco Use  . Smoking status: Never Smoker  . Smokeless tobacco: Never Used  Vaping Use  . Vaping Use: Never used  Substance and Sexual Activity  . Alcohol use: Never    Alcohol/week: 0.0 standard drinks  . Drug use: Never  . Sexual activity: Not Currently  Other Topics Concern  . Not on file  Social History Narrative  . Not on file   Social Determinants of Health   Financial Resource Strain: Not on file  Food Insecurity: Not on file  Transportation Needs: Not on file  Physical Activity: Not on file  Stress: Not on file  Social Connections: Not on file     Family History:  The patient's ***  family history includes Breast cancer in her sister and sister; Colon cancer in her father; Congestive Heart Failure in her mother; Dementia in her brother; Healthy in her sister.   ROS:   Please see the history of present illness.    ROS All other systems reviewed and are negative.   PHYSICAL EXAM:   VS:  There were no vitals taken for this visit.  Physical Exam  GEN: Well nourished, well developed, in no acute distress  HEENT: normal  Neck: no JVD, carotid bruits, or masses Cardiac:RRR; no murmurs, rubs, or gallops  Respiratory:  clear to auscultation bilaterally, normal work of breathing GI: soft, nontender, nondistended, + BS Ext: without cyanosis, clubbing, or edema, Good distal  pulses bilaterally MS: no deformity or atrophy  Skin: warm and dry, no rash Neuro:  Alert and Oriented x 3, Strength and sensation are intact Psych: euthymic mood, full affect  Wt Readings from Last 3 Encounters:  10/17/19 165 lb 14.4 oz (75.3 kg)  07/11/19 170 lb 12.8 oz (77.5 kg)  04/25/19 169 lb 8 oz (76.9 kg)      Studies/Labs Reviewed:   EKG:  EKG is*** ordered today.  The ekg ordered today demonstrates ***  Recent Labs: 07/04/2019: ALT 20; BUN 22; Hemoglobin 14.0; Platelets 176; Potassium 4.1; Sodium 137; TSH 5.341 10/12/2019: Creatinine, Ser 0.70   Lipid Panel    Component Value Date/Time   CHOL 132 10/07/2017 0356   TRIG 66 10/07/2017 0356   HDL 43 10/07/2017 0356   CHOLHDL 3.1 10/07/2017 0356   VLDL 13 10/07/2017 0356   LDLCALC 76 10/07/2017 0356    Additional studies/ records that were reviewed today include:  Echocardiogram was performed at [facility] on 04/16/14 and demonstrated normal left ventricular systolic function, EF 58-09%, diastolic dysfunction, moderate left atrial dilatation, moderate right atrial dilatation, mild aortic sclerosis, mild mitral regurgitation, moderate tricuspid regurgitation with RVSP 45 mmHg, and moderate concentric LVH.    Nuclear stress test 06/19/18:    There was no ST segment deviation noted during stress.  The study is normal. There are no perfusion defects  This is a low risk study.  The left ventricular ejection fraction is hyperdynamic (>65%).         Risk Assessment/Calculations:   {Does this patient have ATRIAL FIBRILLATION?:[phone]}     ASSESSMENT:    No diagnosis found.   PLAN:  In order of problems listed above:  Permanent atrial fib no anticoagulation because of her reluctance to take anticoagulation and history of falls  Essential hypertension  History of chest pain normal NST 06/2018  Lower extremity edema on low-dose Lasix    Shared Decision Making/Informed Consent   {Are you  ordering a CV Procedure (e.g. stress test, cath, DCCV, TEE, etc)?   Press F2        :[id number]}    Medication Adjustments/Labs and Tests Ordered: Current medicines are reviewed at length with the patient today.  Concerns regarding medicines are outlined above.  Medication changes, Labs and Tests ordered today are listed in the Patient Instructions below. There are no Patient Instructions on file for this visit.   Sumner Boast, PA-C  02/17/2020 3:27 PM    [facility] [place], [place], [place]  [address] Phone: [phone]; Fax: [phone]

## 2020-02-19 ENCOUNTER — Ambulatory Visit: Payer: Medicare Other | Admitting: Physician Assistant

## 2020-02-19 DIAGNOSIS — M5432 Sciatica, left side: Secondary | ICD-10-CM | POA: Diagnosis not present

## 2020-02-27 ENCOUNTER — Ambulatory Visit (INDEPENDENT_AMBULATORY_CARE_PROVIDER_SITE_OTHER): Payer: Medicare Other | Admitting: Family Medicine

## 2020-02-27 ENCOUNTER — Encounter: Payer: Self-pay | Admitting: Family Medicine

## 2020-02-27 VITALS — BP 138/80 | HR 70 | Ht 67.0 in | Wt 165.8 lb

## 2020-02-27 DIAGNOSIS — I4821 Permanent atrial fibrillation: Secondary | ICD-10-CM | POA: Diagnosis not present

## 2020-02-27 DIAGNOSIS — R079 Chest pain, unspecified: Secondary | ICD-10-CM

## 2020-02-27 DIAGNOSIS — I10 Essential (primary) hypertension: Secondary | ICD-10-CM

## 2020-02-27 DIAGNOSIS — R600 Localized edema: Secondary | ICD-10-CM

## 2020-02-27 NOTE — Patient Instructions (Addendum)

## 2020-02-27 NOTE — Progress Notes (Signed)
Cardiology Office Note  Date: 02/27/2020   ID: Tracy Bowman, DOB Dec 10, 1927, MRN [id number]  PCP:  Neale Burly, MD  Cardiologist:  No primary care provider on file. Electrophysiologist:  None   Chief Complaint: Cardiac follow-up 1 year  History of Present Illness: Tracy Bowman is a 87 y.o. female with a history of permanent atrial fibrillation, HTN, edema, chest pain.  Last encounter with Dr. Bronson Ing 01/10/2019 via telemedicine.  She denied any palpitations or shortness of breath seldom any type of focal chest pain which were chronic.  Had a cough ever since beginning radiation treatment for laryngeal cancer.  She is here for 1 year cardiac follow-up.  She denies any recent anginal or exertional symptoms, palpitations or arrhythmias although she has atrial fibrillation.  Atrial rate is controlled with a rate of 61 today.  Denies any bleeding issues.  States she does have a constant chronic cough.  She has a history of laryngeal cancer with prior treatment with radiation.  Denies any CVA or TIA-like symptoms, orthostatic symptoms, PND, orthopnea, claudication-like symptoms, DVT or PE-like symptoms.  She has some chronic lower extremity edema which appears to be from venous varicosities/venous insufficiency.  She is wearing low-grade compression stockings today.  Past Medical History:  Diagnosis Date  . Anxiety neurosis   . Arthritis    "a touch in my hands" (10/09/2017)  . 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 Chronic atrial fibrillation ([place])   . Chronic diastolic (congestive) heart failure ([place])   . Depression with anxiety   . Hyperlipidemia   . Hypothyroidism   . Vocal cord mass   . Wears glasses     Past Surgical History:  Procedure Laterality Date  . CATARACT EXTRACTION W/ INTRAOCULAR LENS  IMPLANT, BILATERAL Bilateral   . COLECTOMY Left 12/03/2013   hemicolectomy/notes 12/03/2013  .  COLONOSCOPY    . FRACTURE SURGERY    . HIP FRACTURE SURGERY Right 2018; ~ 08/2017  . MICROLARYNGOSCOPY N/A 10/10/2018   Procedure: MICRO DIRECT LARYNGOSCOPY WITH EXCISION OF VOCAL CORD MASS;  Surgeon: Leta Baptist, MD;  Location: [facility];  Service: ENT;  Laterality: N/A;  . MULTIPLE TOOTH EXTRACTIONS    . ORIF FEMUR FRACTURE Right 10/07/2017   Procedure: OPEN REDUCTION INTERNAL FIXATION (ORIF) DISTAL FEMUR FRACTURE;  Surgeon: Altamese Hanahan, MD;  Location: [facility];  Service: Orthopedics;  Laterality: Right;  . TOTAL ABDOMINAL HYSTERECTOMY  12/03/2013   Archie Endo 12/03/2013    Current Outpatient Medications  Medication Sig Dispense Refill  . acetaminophen (TYLENOL) 325 MG tablet Take 2 tablets (650 mg total) by mouth every 6 (six) hours as needed.    Marland Kitchen alendronate (FOSAMAX) 70 MG tablet Take 70 mg by mouth once a week.   0  . ALPRAZolam (XANAX) 0.5 MG tablet Take 1 tablet (0.5 mg total) by mouth at bedtime as needed. 8 tablet 0  . aspirin EC 81 MG EC tablet Take 1 tablet (81 mg total) by mouth daily.    . calcium carbonate (OSCAL) 1500 (600 Ca) MG TABS tablet Take 1 tablet by mouth daily.    . Cholecalciferol (VITAMIN D) 50 MCG (2000 UT) tablet Take 2,000 Units by mouth daily.     . CVS VITAMIN B12 1000 MCG tablet Take 1,000 mcg by mouth daily.  0  . diltiazem (CARDIZEM) 120 MG tablet Take 120 mg by mouth daily.    . furosemide (LASIX) 20  MG tablet Take 20 mg by mouth daily.     Marland Kitchen levothyroxine (SYNTHROID) 25 MCG tablet Take 25 mcg by mouth daily.    . magic mouthwash w/lidocaine SOLN Take 5 mLs by mouth as needed for mouth pain.    . Magnesium 200 MG TABS Take 1 tablet by mouth daily.  0  . midodrine (PROAMATINE) 2.5 MG tablet Take 2.5 mg by mouth 2 (two) times daily.  2  . mirtazapine (REMERON) 15 MG tablet Take 15 mg by mouth at bedtime.     . Multiple Vitamins-Minerals (CVS ONE DAILY ESSENTIAL) TABS Take 1 tablet by mouth daily.  0  . Omega-3 Fatty Acids (FISH OIL) 1000 MG CAPS Take 1,000 mg by  mouth 2 (two) times daily.     . sertraline (ZOLOFT) 50 MG tablet Take 50 mg by mouth daily.  0  . traMADol (ULTRAM) 50 MG tablet Take 50 mg by mouth 2 (two) times daily as needed.     No current facility-administered medications for this visit.   Allergies:  Morphine and Oxycodone   Social History: The patient  reports that she has never smoked. She has never used smokeless tobacco. She reports that she does not drink alcohol and does not use drugs.   Family History: The patient's family history includes Breast cancer in her sister and sister; Colon cancer in her father; Congestive Heart Failure in her mother; Dementia in her brother; Healthy in her sister.   ROS:  Please see the history of present illness. Otherwise, complete review of systems is positive for none.  All other systems are reviewed and negative.   Physical Exam: VS:  BP 138/80   Pulse 70   Ht 5\' 7"  (1.702 m)   Wt 165 lb 12.8 oz (75.2 kg)   SpO2 97%   BMI 25.97 kg/m , BMI Body mass index is 25.97 kg/m.  Wt Readings from Last 3 Encounters:  02/27/20 165 lb 12.8 oz (75.2 kg)  10/17/19 165 lb 14.4 oz (75.3 kg)  07/11/19 170 lb 12.8 oz (77.5 kg)    General: Patient appears comfortable at rest. Neck: Supple, no elevated JVP or carotid bruits, no thyromegaly. Lungs: Clear to auscultation, nonlabored breathing at rest. Cardiac: Regular rate and rhythm, no S3 or significant systolic murmur, no pericardial rub. Extremities: No pitting edema, distal pulses 2+. Skin: Warm and dry. Musculoskeletal: No kyphosis. Neuropsychiatric: Alert and oriented x3, affect grossly appropriate.  ECG:  An ECG dated 02/27/2020 was personally reviewed today and demonstrated:  Atrial fibrillation with a rate of 61.  Septal infarct, age undetermined  Recent Labwork: 07/04/2019: ALT 20; AST 24; BUN 22; Hemoglobin 14.0; Platelets 176; Potassium 4.1; Sodium 137; TSH 5.341 10/12/2019: Creatinine, Ser 0.70     Component Value Date/Time   CHOL 132  10/07/2017 0356   TRIG 66 10/07/2017 0356   HDL 43 10/07/2017 0356   CHOLHDL 3.1 10/07/2017 0356   VLDL 13 10/07/2017 0356   LDLCALC 76 10/07/2017 0356    Other Studies Reviewed Today:  Echocardiogram was performed at [facility] on 04/16/14 and demonstrated normal left ventricular systolic function, EF 29-92%, diastolic dysfunction, moderate left atrial dilatation, moderate right atrial dilatation, mild aortic sclerosis, mild mitral regurgitation, moderate tricuspid regurgitation with RVSP 45 mmHg, and moderate concentric LVH.  Nuclear stress test 06/19/18:   There was no ST segment deviation noted during stress.  The study is normal. There are no perfusion defects  This is a low risk study.  The left  ventricular ejection fraction is hyperdynamic (>65%).      Assessment and Plan:  1. Permanent atrial fibrillation ([place])   2. Essential hypertension   3. Bilateral lower extremity edema   4. Chest pain, unspecified type    1. Permanent atrial fibrillation ([facility]) EKG today demonstrates atrial fibrillation with a rate of 61, septal infarct, age undetermined.  Denies any recent bleeding.  Continue aspirin 81 mg daily.  Continue diltiazem to 120 mg p.o. daily.  2. Essential hypertension Blood pressure reasonably well controlled at 138/80.  Continue diltiazem 120 mg p.o. daily.  3. Bilateral lower extremity edema Chronic bilateral lower extremity edema likely secondary to venous varicosities/venous insufficiency.  Continue Lasix 20 mg daily.  4. Chest pain, unspecified type States she has mild very transient pain in various areas on her chest.  She denies any radiation, or associated symptoms.  Denies association with exertion.  Denies any associated symptoms i.e. nausea, vomiting, or diaphoresis.  Continue aspirin 81 mg daily  Medication Adjustments/Labs and Tests Ordered: Current medicines are reviewed at length with the patient today.  Concerns regarding medicines are  outlined above.   Disposition: Follow-up with Dr. Harl Bowie or APP 1 year  Signed, Levell July, NP 02/27/2020 2:20 PM    [facility] [place], [place], [place] [address] Phone: [phone]; Fax: [phone]

## 2020-03-13 DIAGNOSIS — W19XXXA Unspecified fall, initial encounter: Secondary | ICD-10-CM | POA: Diagnosis not present

## 2020-03-13 DIAGNOSIS — I10 Essential (primary) hypertension: Secondary | ICD-10-CM | POA: Diagnosis not present

## 2020-03-13 DIAGNOSIS — S7002XA Contusion of left hip, initial encounter: Secondary | ICD-10-CM | POA: Diagnosis not present

## 2020-03-16 DIAGNOSIS — M5432 Sciatica, left side: Secondary | ICD-10-CM | POA: Diagnosis not present

## 2020-03-16 DIAGNOSIS — M25552 Pain in left hip: Secondary | ICD-10-CM | POA: Diagnosis not present

## 2020-03-29 DIAGNOSIS — M4856XA Collapsed vertebra, not elsewhere classified, lumbar region, initial encounter for fracture: Secondary | ICD-10-CM | POA: Diagnosis not present

## 2020-03-29 DIAGNOSIS — W19XXXA Unspecified fall, initial encounter: Secondary | ICD-10-CM | POA: Diagnosis not present

## 2020-03-29 DIAGNOSIS — S32000A Wedge compression fracture of unspecified lumbar vertebra, initial encounter for closed fracture: Secondary | ICD-10-CM | POA: Diagnosis not present

## 2020-03-29 DIAGNOSIS — S32010A Wedge compression fracture of first lumbar vertebra, initial encounter for closed fracture: Secondary | ICD-10-CM | POA: Diagnosis not present

## 2020-03-29 DIAGNOSIS — Z79891 Long term (current) use of opiate analgesic: Secondary | ICD-10-CM | POA: Diagnosis not present

## 2020-03-29 DIAGNOSIS — Z886 Allergy status to analgesic agent status: Secondary | ICD-10-CM | POA: Diagnosis not present

## 2020-03-29 DIAGNOSIS — S32020A Wedge compression fracture of second lumbar vertebra, initial encounter for closed fracture: Secondary | ICD-10-CM | POA: Diagnosis not present

## 2020-03-29 DIAGNOSIS — M5136 Other intervertebral disc degeneration, lumbar region: Secondary | ICD-10-CM | POA: Diagnosis not present

## 2020-04-01 DIAGNOSIS — M5432 Sciatica, left side: Secondary | ICD-10-CM | POA: Diagnosis not present

## 2020-04-06 DIAGNOSIS — C320 Malignant neoplasm of glottis: Secondary | ICD-10-CM | POA: Diagnosis not present

## 2020-04-06 DIAGNOSIS — R531 Weakness: Secondary | ICD-10-CM | POA: Diagnosis not present

## 2020-04-06 DIAGNOSIS — Z8521 Personal history of malignant neoplasm of larynx: Secondary | ICD-10-CM | POA: Diagnosis not present

## 2020-04-06 DIAGNOSIS — C5701 Malignant neoplasm of right fallopian tube: Secondary | ICD-10-CM | POA: Diagnosis not present

## 2020-04-15 DIAGNOSIS — C320 Malignant neoplasm of glottis: Secondary | ICD-10-CM | POA: Diagnosis not present

## 2020-04-15 DIAGNOSIS — Z483 Aftercare following surgery for neoplasm: Secondary | ICD-10-CM | POA: Diagnosis not present

## 2020-04-15 DIAGNOSIS — R490 Dysphonia: Secondary | ICD-10-CM | POA: Diagnosis not present

## 2020-04-15 DIAGNOSIS — Z923 Personal history of irradiation: Secondary | ICD-10-CM | POA: Diagnosis not present

## 2020-04-15 DIAGNOSIS — Z9221 Personal history of antineoplastic chemotherapy: Secondary | ICD-10-CM | POA: Diagnosis not present

## 2020-04-17 ENCOUNTER — Inpatient Hospital Stay (HOSPITAL_COMMUNITY): Payer: Medicare Other

## 2020-04-17 ENCOUNTER — Ambulatory Visit (HOSPITAL_COMMUNITY): Payer: Medicare Other

## 2020-04-23 ENCOUNTER — Ambulatory Visit (HOSPITAL_COMMUNITY): Payer: Medicare Other | Admitting: Hematology

## 2020-04-23 DIAGNOSIS — M25552 Pain in left hip: Secondary | ICD-10-CM | POA: Diagnosis not present

## 2020-04-23 DIAGNOSIS — M545 Low back pain, unspecified: Secondary | ICD-10-CM | POA: Diagnosis not present

## 2020-04-23 DIAGNOSIS — M5459 Other low back pain: Secondary | ICD-10-CM | POA: Diagnosis not present

## 2020-04-23 DIAGNOSIS — R102 Pelvic and perineal pain: Secondary | ICD-10-CM | POA: Diagnosis not present

## 2020-05-02 DIAGNOSIS — E038 Other specified hypothyroidism: Secondary | ICD-10-CM | POA: Diagnosis not present

## 2020-05-02 DIAGNOSIS — I10 Essential (primary) hypertension: Secondary | ICD-10-CM | POA: Diagnosis not present

## 2020-05-02 DIAGNOSIS — M5459 Other low back pain: Secondary | ICD-10-CM | POA: Diagnosis not present

## 2020-06-03 DIAGNOSIS — E559 Vitamin D deficiency, unspecified: Secondary | ICD-10-CM | POA: Diagnosis not present

## 2020-06-03 DIAGNOSIS — D649 Anemia, unspecified: Secondary | ICD-10-CM | POA: Diagnosis not present

## 2020-06-03 DIAGNOSIS — E038 Other specified hypothyroidism: Secondary | ICD-10-CM | POA: Diagnosis not present

## 2020-06-03 DIAGNOSIS — I10 Essential (primary) hypertension: Secondary | ICD-10-CM | POA: Diagnosis not present

## 2020-06-03 DIAGNOSIS — Z79899 Other long term (current) drug therapy: Secondary | ICD-10-CM | POA: Diagnosis not present

## 2020-06-03 DIAGNOSIS — R5383 Other fatigue: Secondary | ICD-10-CM | POA: Diagnosis not present

## 2020-07-15 DIAGNOSIS — C320 Malignant neoplasm of glottis: Secondary | ICD-10-CM | POA: Diagnosis not present

## 2020-07-15 DIAGNOSIS — Z8521 Personal history of malignant neoplasm of larynx: Secondary | ICD-10-CM | POA: Diagnosis not present

## 2020-07-15 DIAGNOSIS — R490 Dysphonia: Secondary | ICD-10-CM | POA: Diagnosis not present

## 2020-07-15 DIAGNOSIS — R1310 Dysphagia, unspecified: Secondary | ICD-10-CM | POA: Diagnosis not present

## 2020-09-03 DIAGNOSIS — N3091 Cystitis, unspecified with hematuria: Secondary | ICD-10-CM | POA: Diagnosis not present

## 2020-09-03 DIAGNOSIS — E038 Other specified hypothyroidism: Secondary | ICD-10-CM | POA: Diagnosis not present

## 2020-09-03 DIAGNOSIS — K2100 Gastro-esophageal reflux disease with esophagitis, without bleeding: Secondary | ICD-10-CM | POA: Diagnosis not present

## 2020-09-03 DIAGNOSIS — Z0000 Encounter for general adult medical examination without abnormal findings: Secondary | ICD-10-CM | POA: Diagnosis not present

## 2020-09-03 DIAGNOSIS — Z1331 Encounter for screening for depression: Secondary | ICD-10-CM | POA: Diagnosis not present

## 2020-09-03 DIAGNOSIS — I10 Essential (primary) hypertension: Secondary | ICD-10-CM | POA: Diagnosis not present

## 2020-12-03 DIAGNOSIS — K2100 Gastro-esophageal reflux disease with esophagitis, without bleeding: Secondary | ICD-10-CM | POA: Diagnosis not present

## 2020-12-03 DIAGNOSIS — I10 Essential (primary) hypertension: Secondary | ICD-10-CM | POA: Diagnosis not present

## 2020-12-03 DIAGNOSIS — E038 Other specified hypothyroidism: Secondary | ICD-10-CM | POA: Diagnosis not present

## 2021-03-04 DIAGNOSIS — K2100 Gastro-esophageal reflux disease with esophagitis, without bleeding: Secondary | ICD-10-CM | POA: Diagnosis not present

## 2021-03-04 DIAGNOSIS — I10 Essential (primary) hypertension: Secondary | ICD-10-CM | POA: Diagnosis not present

## 2021-03-04 DIAGNOSIS — E038 Other specified hypothyroidism: Secondary | ICD-10-CM | POA: Diagnosis not present

## 2021-04-12 DIAGNOSIS — Z9049 Acquired absence of other specified parts of digestive tract: Secondary | ICD-10-CM | POA: Diagnosis not present

## 2021-04-12 DIAGNOSIS — C5701 Malignant neoplasm of right fallopian tube: Secondary | ICD-10-CM | POA: Diagnosis not present

## 2021-04-12 DIAGNOSIS — Z8521 Personal history of malignant neoplasm of larynx: Secondary | ICD-10-CM | POA: Diagnosis not present

## 2021-04-12 DIAGNOSIS — Z90710 Acquired absence of both cervix and uterus: Secondary | ICD-10-CM | POA: Diagnosis not present

## 2021-04-12 DIAGNOSIS — Z9221 Personal history of antineoplastic chemotherapy: Secondary | ICD-10-CM | POA: Diagnosis not present

## 2021-04-12 DIAGNOSIS — Z9079 Acquired absence of other genital organ(s): Secondary | ICD-10-CM | POA: Diagnosis not present

## 2021-04-12 DIAGNOSIS — Z9889 Other specified postprocedural states: Secondary | ICD-10-CM | POA: Diagnosis not present

## 2021-04-12 DIAGNOSIS — Z08 Encounter for follow-up examination after completed treatment for malignant neoplasm: Secondary | ICD-10-CM | POA: Diagnosis not present

## 2021-04-12 DIAGNOSIS — Z90722 Acquired absence of ovaries, bilateral: Secondary | ICD-10-CM | POA: Diagnosis not present

## 2021-04-22 IMAGING — PT NM PET TUM IMG INITIAL (PI) SKULL BASE T - THIGH
3 series · 23 of 25 positions shown · non-contrast
Comparison: None

CLINICAL DATA: Subsequent treatment strategy for laryngeal
carcinoma. Additional history of ovarian and colon cancer.

EXAM:
NUCLEAR MEDICINE PET SKULL BASE TO THIGH
TECHNIQUE: 8.6 mCi F-18 FDG was injected intravenously. Full-ring PET imaging
was performed from the skull base to thigh after the radiotracer. CT
data was obtained and used for attenuation correction and anatomic
localization.
Fasting blood glucose: 6 96 mg/dl

[mip · 4 of 48 slices shown]
[im 1/48]
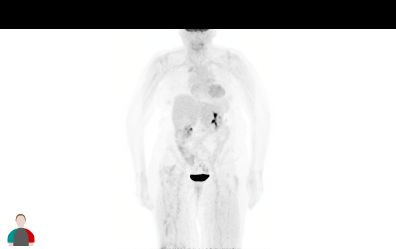
[im 16/48]
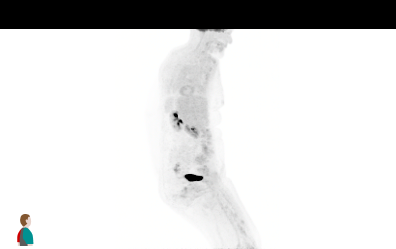
[im 32/48]
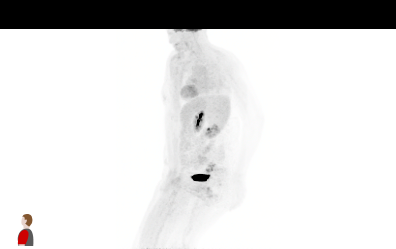
[im 48/48]
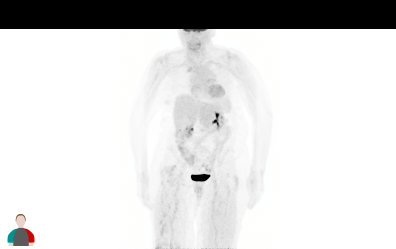

[axial ct wb fusion · 15 of 209 slices shown]
[im 1/209]
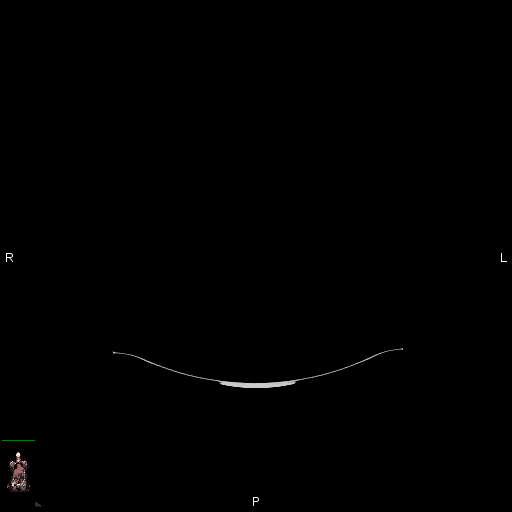
[im 14/209]
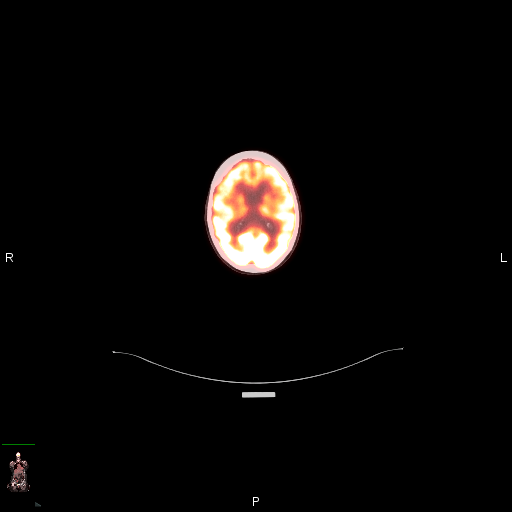
[im 40/209]
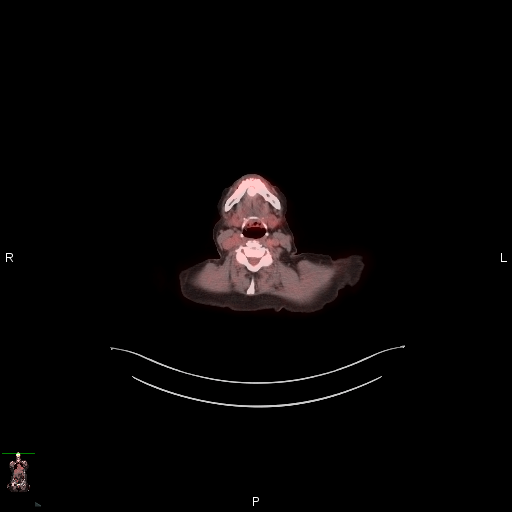
[im 53/209]
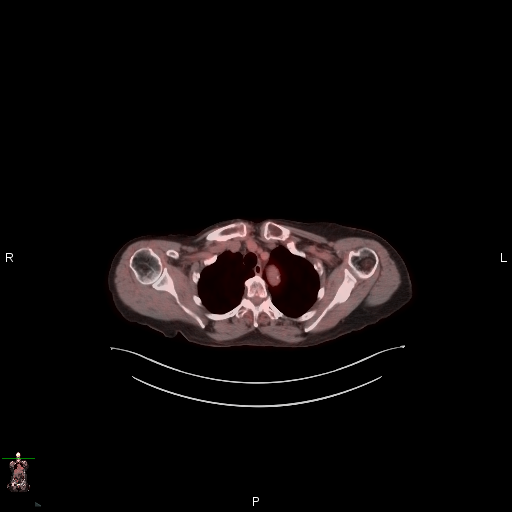
[im 66/209]
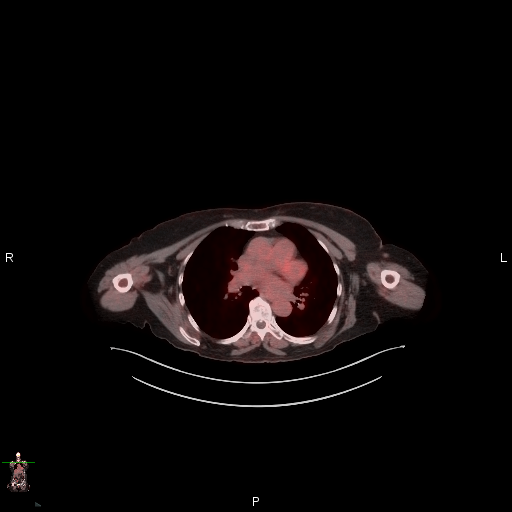
[im 79/209]
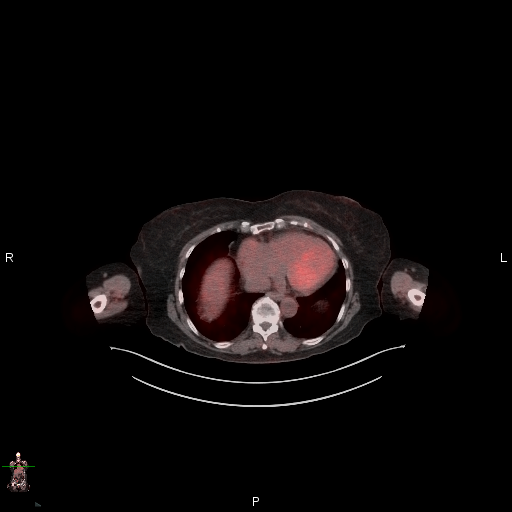
[im 92/209]
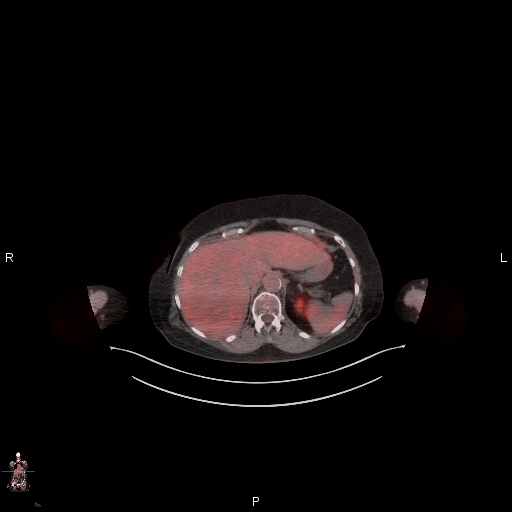
[im 105/209]
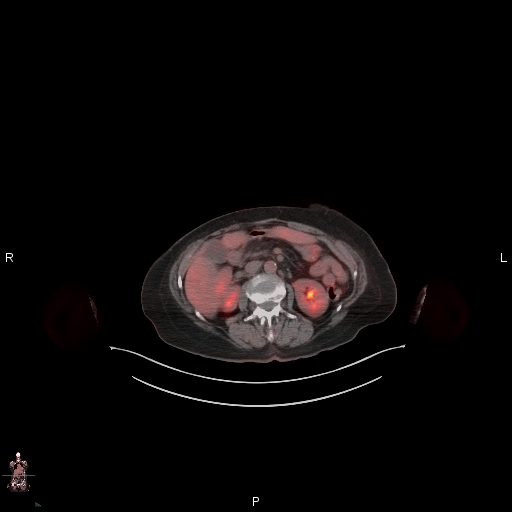
[im 118/209]
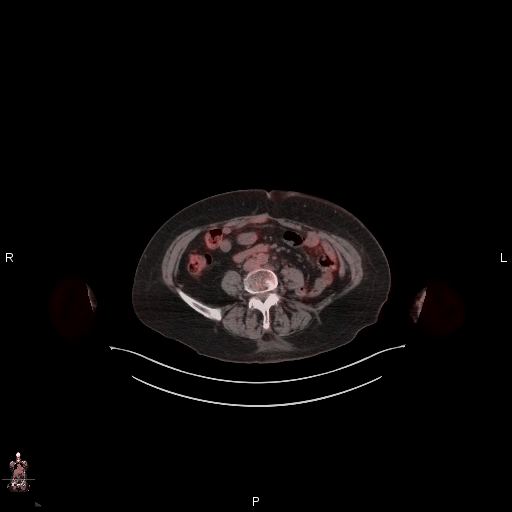
[im 131/209]
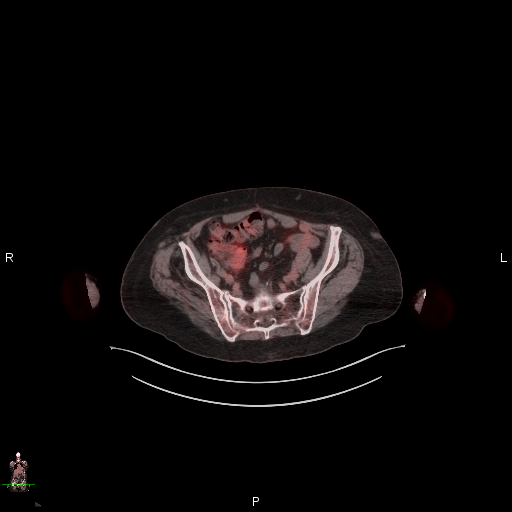
[im 144/209]
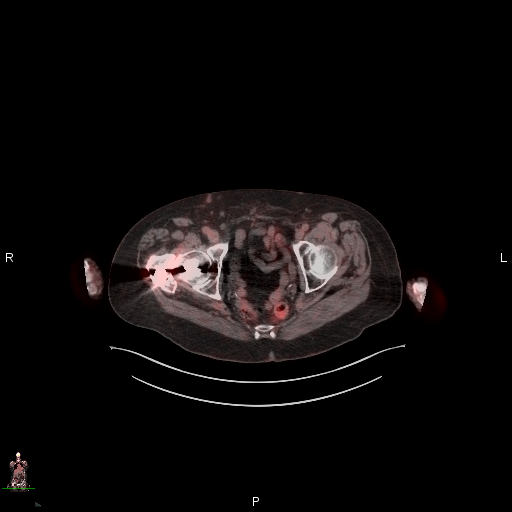
[im 157/209]
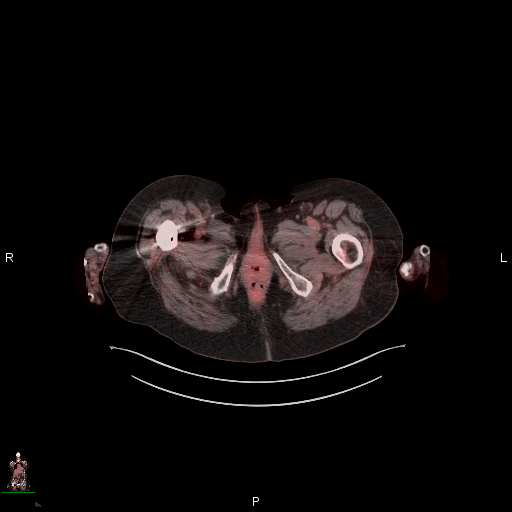
[im 170/209]
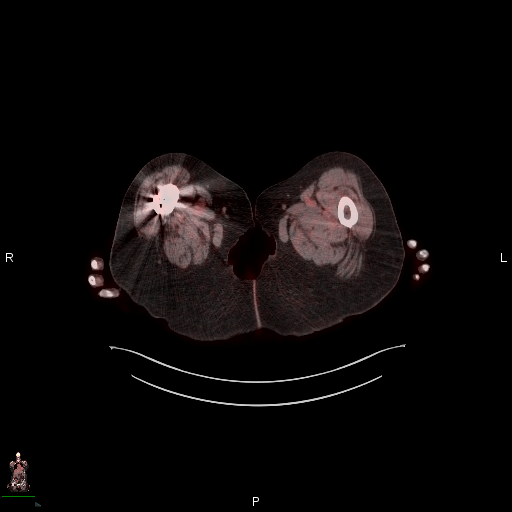
[im 196/209]
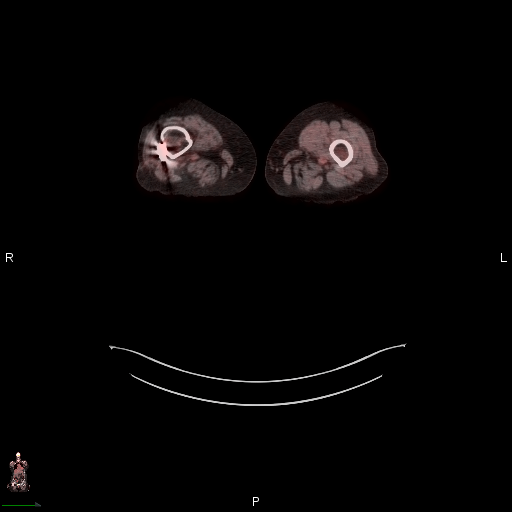
[im 209/209]
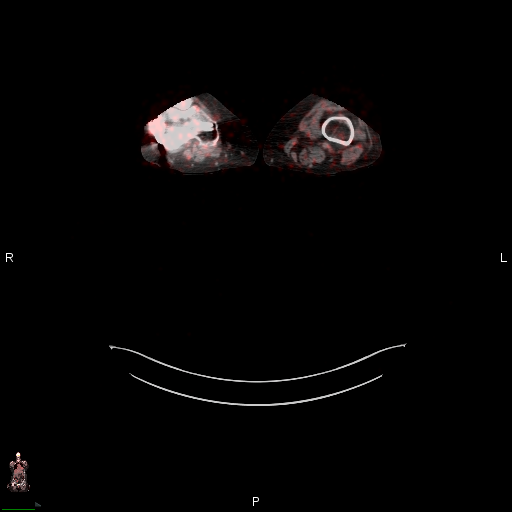

[coronal ct wb fusion · 4 of 50 slices shown]
[im 1/50]
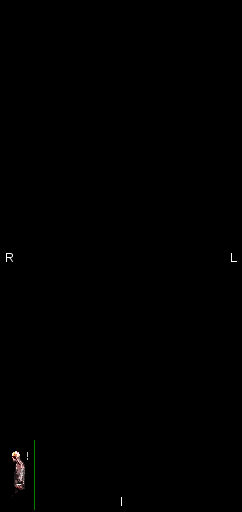
[im 17/50]
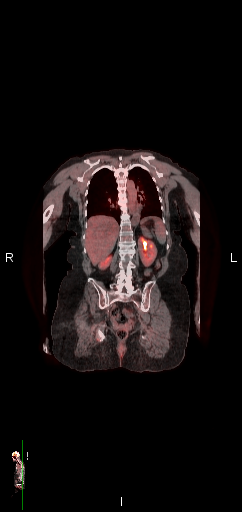
[im 33/50]
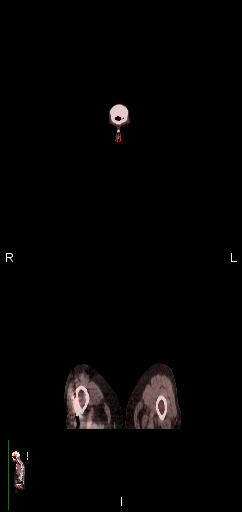
[im 50/50]
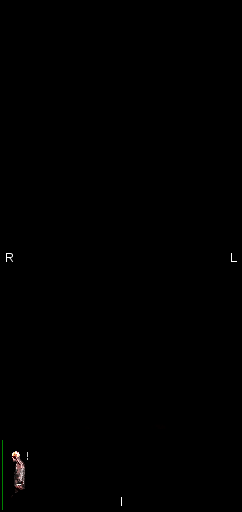

[23 of 25 positions shown; findings below may reference images not displayed]

FINDINGS: Mediastinal blood pool activity: SUV max

Liver activity: SUV max NA

NECK: There is focal activity at the posterior aspect of the larynx
with SUV max equal 5.4. Activity midline. No lesion apparent on the
CT portion exam.

No hypermetabolic cervical lymph nodes.

There is moderate activity in the posterior oropharynx in LEFT and
RIGHT but greater on the LEFT with SUV max equal 6.1 (image 73).
There is mild thickening the posterior LEFT oropharynx associated
with the metabolic activity (image 70/3).

Incidental CT findings: none

CHEST: No hypermetabolic mediastinal or hilar nodes. No suspicious
pulmonary nodules on the CT scan.

Incidental CT findings: None

ABDOMEN/PELVIS: No abnormal hypermetabolic activity within the
liver, pancreas, adrenal glands, or spleen. No hypermetabolic lymph
nodes in the abdomen or pelvis.

No metabolic activity associated with the mid sigmoid colon which is
favored physiologic (image 29 of fused data set.

Incidental CT findings: Low-density cyst in the RIGHT hepatic lobe
appears benign

SKELETON: There is intense uptake associated the medial aspect of
the RIGHT clavicle head. Favor degenerative uptake.

Incidental CT findings: Internal fixation of RIGHT femur
IMPRESSION: 1. Mild focal activity in the posterior aspect of the larynx is
nonspecific.
2. Asymmetric metabolic activity in the posterior LEFT oropharynx
with some tissue thickening. Nonspecific, recommend direct
visualization.
3. No evidence of metastatic adenopathy in the neck.
4. No evidence distant metastatic disease.

## 2021-06-02 DIAGNOSIS — I5031 Acute diastolic (congestive) heart failure: Secondary | ICD-10-CM | POA: Diagnosis not present

## 2021-06-02 DIAGNOSIS — E038 Other specified hypothyroidism: Secondary | ICD-10-CM | POA: Diagnosis not present

## 2021-06-02 DIAGNOSIS — K2100 Gastro-esophageal reflux disease with esophagitis, without bleeding: Secondary | ICD-10-CM | POA: Diagnosis not present

## 2021-06-02 DIAGNOSIS — I10 Essential (primary) hypertension: Secondary | ICD-10-CM | POA: Diagnosis not present

## 2021-06-02 DIAGNOSIS — M79605 Pain in left leg: Secondary | ICD-10-CM | POA: Diagnosis not present

## 2021-06-09 DIAGNOSIS — M79605 Pain in left leg: Secondary | ICD-10-CM | POA: Diagnosis not present

## 2021-06-09 DIAGNOSIS — R600 Localized edema: Secondary | ICD-10-CM | POA: Diagnosis not present

## 2021-06-09 DIAGNOSIS — M79662 Pain in left lower leg: Secondary | ICD-10-CM | POA: Diagnosis not present

## 2021-09-14 DIAGNOSIS — I5031 Acute diastolic (congestive) heart failure: Secondary | ICD-10-CM | POA: Diagnosis not present

## 2021-09-14 DIAGNOSIS — I10 Essential (primary) hypertension: Secondary | ICD-10-CM | POA: Diagnosis not present

## 2021-09-14 DIAGNOSIS — E038 Other specified hypothyroidism: Secondary | ICD-10-CM | POA: Diagnosis not present

## 2021-09-14 DIAGNOSIS — K2100 Gastro-esophageal reflux disease with esophagitis, without bleeding: Secondary | ICD-10-CM | POA: Diagnosis not present

## 2021-09-14 DIAGNOSIS — M79605 Pain in left leg: Secondary | ICD-10-CM | POA: Diagnosis not present

## 2021-11-02 DIAGNOSIS — I5032 Chronic diastolic (congestive) heart failure: Secondary | ICD-10-CM | POA: Diagnosis not present

## 2021-11-02 DIAGNOSIS — K2100 Gastro-esophageal reflux disease with esophagitis, without bleeding: Secondary | ICD-10-CM | POA: Diagnosis not present

## 2021-11-02 DIAGNOSIS — E038 Other specified hypothyroidism: Secondary | ICD-10-CM | POA: Diagnosis not present

## 2021-11-02 DIAGNOSIS — I10 Essential (primary) hypertension: Secondary | ICD-10-CM | POA: Diagnosis not present

## 2021-12-14 DIAGNOSIS — E038 Other specified hypothyroidism: Secondary | ICD-10-CM | POA: Diagnosis not present

## 2021-12-14 DIAGNOSIS — K2100 Gastro-esophageal reflux disease with esophagitis, without bleeding: Secondary | ICD-10-CM | POA: Diagnosis not present

## 2021-12-14 DIAGNOSIS — I5032 Chronic diastolic (congestive) heart failure: Secondary | ICD-10-CM | POA: Diagnosis not present

## 2021-12-14 DIAGNOSIS — I10 Essential (primary) hypertension: Secondary | ICD-10-CM | POA: Diagnosis not present

## 2021-12-17 DIAGNOSIS — I10 Essential (primary) hypertension: Secondary | ICD-10-CM | POA: Diagnosis not present

## 2021-12-17 DIAGNOSIS — E782 Mixed hyperlipidemia: Secondary | ICD-10-CM | POA: Diagnosis not present

## 2021-12-17 DIAGNOSIS — E038 Other specified hypothyroidism: Secondary | ICD-10-CM | POA: Diagnosis not present

## 2021-12-17 DIAGNOSIS — I5032 Chronic diastolic (congestive) heart failure: Secondary | ICD-10-CM | POA: Diagnosis not present

## 2021-12-17 DIAGNOSIS — E119 Type 2 diabetes mellitus without complications: Secondary | ICD-10-CM | POA: Diagnosis not present

## 2022-01-01 DIAGNOSIS — Z885 Allergy status to narcotic agent status: Secondary | ICD-10-CM | POA: Diagnosis not present

## 2022-01-01 DIAGNOSIS — L97829 Non-pressure chronic ulcer of other part of left lower leg with unspecified severity: Secondary | ICD-10-CM | POA: Diagnosis not present

## 2022-01-01 DIAGNOSIS — I83028 Varicose veins of left lower extremity with ulcer other part of lower leg: Secondary | ICD-10-CM | POA: Diagnosis not present

## 2022-01-01 DIAGNOSIS — R0989 Other specified symptoms and signs involving the circulatory and respiratory systems: Secondary | ICD-10-CM | POA: Diagnosis not present

## 2022-01-03 DIAGNOSIS — Z0000 Encounter for general adult medical examination without abnormal findings: Secondary | ICD-10-CM | POA: Diagnosis not present

## 2022-01-03 DIAGNOSIS — L97929 Non-pressure chronic ulcer of unspecified part of left lower leg with unspecified severity: Secondary | ICD-10-CM | POA: Diagnosis not present

## 2022-01-03 DIAGNOSIS — Z885 Allergy status to narcotic agent status: Secondary | ICD-10-CM | POA: Diagnosis not present

## 2022-01-17 DIAGNOSIS — L0390 Cellulitis, unspecified: Secondary | ICD-10-CM | POA: Diagnosis not present

## 2022-01-17 DIAGNOSIS — K219 Gastro-esophageal reflux disease without esophagitis: Secondary | ICD-10-CM | POA: Diagnosis not present

## 2022-01-17 DIAGNOSIS — I5032 Chronic diastolic (congestive) heart failure: Secondary | ICD-10-CM | POA: Diagnosis not present

## 2022-01-17 DIAGNOSIS — E038 Other specified hypothyroidism: Secondary | ICD-10-CM | POA: Diagnosis not present

## 2022-01-17 DIAGNOSIS — I10 Essential (primary) hypertension: Secondary | ICD-10-CM | POA: Diagnosis not present

## 2022-01-17 DIAGNOSIS — E1143 Type 2 diabetes mellitus with diabetic autonomic (poly)neuropathy: Secondary | ICD-10-CM | POA: Diagnosis not present

## 2022-03-01 DIAGNOSIS — I872 Venous insufficiency (chronic) (peripheral): Secondary | ICD-10-CM | POA: Diagnosis not present

## 2022-03-01 DIAGNOSIS — L821 Other seborrheic keratosis: Secondary | ICD-10-CM | POA: Diagnosis not present

## 2022-03-01 DIAGNOSIS — L259 Unspecified contact dermatitis, unspecified cause: Secondary | ICD-10-CM | POA: Diagnosis not present

## 2022-03-30 DIAGNOSIS — L821 Other seborrheic keratosis: Secondary | ICD-10-CM | POA: Diagnosis not present

## 2022-03-30 DIAGNOSIS — D1801 Hemangioma of skin and subcutaneous tissue: Secondary | ICD-10-CM | POA: Diagnosis not present

## 2022-03-30 DIAGNOSIS — L259 Unspecified contact dermatitis, unspecified cause: Secondary | ICD-10-CM | POA: Diagnosis not present

## 2022-03-30 DIAGNOSIS — I872 Venous insufficiency (chronic) (peripheral): Secondary | ICD-10-CM | POA: Diagnosis not present

## 2022-04-18 DIAGNOSIS — C5701 Malignant neoplasm of right fallopian tube: Secondary | ICD-10-CM | POA: Diagnosis not present

## 2022-04-18 DIAGNOSIS — M25552 Pain in left hip: Secondary | ICD-10-CM | POA: Diagnosis not present

## 2022-04-18 DIAGNOSIS — M25551 Pain in right hip: Secondary | ICD-10-CM | POA: Diagnosis not present

## 2022-05-23 DIAGNOSIS — I10 Essential (primary) hypertension: Secondary | ICD-10-CM | POA: Diagnosis not present

## 2022-05-23 DIAGNOSIS — K219 Gastro-esophageal reflux disease without esophagitis: Secondary | ICD-10-CM | POA: Diagnosis not present

## 2022-05-23 DIAGNOSIS — E1143 Type 2 diabetes mellitus with diabetic autonomic (poly)neuropathy: Secondary | ICD-10-CM | POA: Diagnosis not present

## 2022-05-23 DIAGNOSIS — Z1331 Encounter for screening for depression: Secondary | ICD-10-CM | POA: Diagnosis not present

## 2022-05-23 DIAGNOSIS — I209 Angina pectoris, unspecified: Secondary | ICD-10-CM | POA: Diagnosis not present

## 2022-05-23 DIAGNOSIS — Z0000 Encounter for general adult medical examination without abnormal findings: Secondary | ICD-10-CM | POA: Diagnosis not present

## 2022-05-23 DIAGNOSIS — E038 Other specified hypothyroidism: Secondary | ICD-10-CM | POA: Diagnosis not present

## 2022-05-23 DIAGNOSIS — I5032 Chronic diastolic (congestive) heart failure: Secondary | ICD-10-CM | POA: Diagnosis not present

## 2022-05-24 DIAGNOSIS — K219 Gastro-esophageal reflux disease without esophagitis: Secondary | ICD-10-CM | POA: Diagnosis not present

## 2022-05-24 DIAGNOSIS — I5032 Chronic diastolic (congestive) heart failure: Secondary | ICD-10-CM | POA: Diagnosis not present

## 2022-05-24 DIAGNOSIS — I10 Essential (primary) hypertension: Secondary | ICD-10-CM | POA: Diagnosis not present

## 2022-05-24 DIAGNOSIS — E038 Other specified hypothyroidism: Secondary | ICD-10-CM | POA: Diagnosis not present

## 2022-05-24 DIAGNOSIS — I209 Angina pectoris, unspecified: Secondary | ICD-10-CM | POA: Diagnosis not present

## 2022-05-24 DIAGNOSIS — E1143 Type 2 diabetes mellitus with diabetic autonomic (poly)neuropathy: Secondary | ICD-10-CM | POA: Diagnosis not present

## 2022-05-24 DIAGNOSIS — Z0000 Encounter for general adult medical examination without abnormal findings: Secondary | ICD-10-CM | POA: Diagnosis not present

## 2022-06-30 DIAGNOSIS — E1143 Type 2 diabetes mellitus with diabetic autonomic (poly)neuropathy: Secondary | ICD-10-CM | POA: Diagnosis not present

## 2022-06-30 DIAGNOSIS — I10 Essential (primary) hypertension: Secondary | ICD-10-CM | POA: Diagnosis not present

## 2022-06-30 DIAGNOSIS — I5032 Chronic diastolic (congestive) heart failure: Secondary | ICD-10-CM | POA: Diagnosis not present

## 2022-06-30 DIAGNOSIS — N182 Chronic kidney disease, stage 2 (mild): Secondary | ICD-10-CM | POA: Diagnosis not present

## 2022-06-30 DIAGNOSIS — E1121 Type 2 diabetes mellitus with diabetic nephropathy: Secondary | ICD-10-CM | POA: Diagnosis not present

## 2022-06-30 DIAGNOSIS — E038 Other specified hypothyroidism: Secondary | ICD-10-CM | POA: Diagnosis not present

## 2022-06-30 DIAGNOSIS — K219 Gastro-esophageal reflux disease without esophagitis: Secondary | ICD-10-CM | POA: Diagnosis not present

## 2022-08-09 DIAGNOSIS — D692 Other nonthrombocytopenic purpura: Secondary | ICD-10-CM | POA: Diagnosis not present

## 2022-08-09 DIAGNOSIS — F408 Other phobic anxiety disorders: Secondary | ICD-10-CM | POA: Diagnosis not present

## 2022-08-09 DIAGNOSIS — I10 Essential (primary) hypertension: Secondary | ICD-10-CM | POA: Diagnosis not present

## 2022-08-13 DIAGNOSIS — M79604 Pain in right leg: Secondary | ICD-10-CM | POA: Diagnosis not present

## 2022-08-13 DIAGNOSIS — M79661 Pain in right lower leg: Secondary | ICD-10-CM | POA: Diagnosis not present

## 2022-08-13 DIAGNOSIS — R609 Edema, unspecified: Secondary | ICD-10-CM | POA: Diagnosis not present

## 2022-08-13 DIAGNOSIS — R52 Pain, unspecified: Secondary | ICD-10-CM | POA: Diagnosis not present

## 2022-08-13 DIAGNOSIS — W19XXXA Unspecified fall, initial encounter: Secondary | ICD-10-CM | POA: Diagnosis not present

## 2022-08-14 DIAGNOSIS — Y92002 Bathroom of unspecified non-institutional (private) residence single-family (private) house as the place of occurrence of the external cause: Secondary | ICD-10-CM | POA: Diagnosis not present

## 2022-08-14 DIAGNOSIS — S0990XA Unspecified injury of head, initial encounter: Secondary | ICD-10-CM | POA: Diagnosis not present

## 2022-08-14 DIAGNOSIS — S82201A Unspecified fracture of shaft of right tibia, initial encounter for closed fracture: Secondary | ICD-10-CM | POA: Diagnosis not present

## 2022-08-14 DIAGNOSIS — F419 Anxiety disorder, unspecified: Secondary | ICD-10-CM | POA: Diagnosis present

## 2022-08-14 DIAGNOSIS — S82451A Displaced comminuted fracture of shaft of right fibula, initial encounter for closed fracture: Secondary | ICD-10-CM | POA: Diagnosis not present

## 2022-08-14 DIAGNOSIS — I69921 Dysphasia following unspecified cerebrovascular disease: Secondary | ICD-10-CM | POA: Diagnosis not present

## 2022-08-14 DIAGNOSIS — K5900 Constipation, unspecified: Secondary | ICD-10-CM | POA: Diagnosis not present

## 2022-08-14 DIAGNOSIS — C763 Malignant neoplasm of pelvis: Secondary | ICD-10-CM | POA: Diagnosis not present

## 2022-08-14 DIAGNOSIS — S82831A Other fracture of upper and lower end of right fibula, initial encounter for closed fracture: Secondary | ICD-10-CM | POA: Diagnosis not present

## 2022-08-14 DIAGNOSIS — S82401D Unspecified fracture of shaft of right fibula, subsequent encounter for closed fracture with routine healing: Secondary | ICD-10-CM | POA: Diagnosis not present

## 2022-08-14 DIAGNOSIS — Z23 Encounter for immunization: Secondary | ICD-10-CM | POA: Diagnosis not present

## 2022-08-14 DIAGNOSIS — Z9181 History of falling: Secondary | ICD-10-CM | POA: Diagnosis not present

## 2022-08-14 DIAGNOSIS — Z1159 Encounter for screening for other viral diseases: Secondary | ICD-10-CM | POA: Diagnosis not present

## 2022-08-14 DIAGNOSIS — Z4789 Encounter for other orthopedic aftercare: Secondary | ICD-10-CM | POA: Diagnosis not present

## 2022-08-14 DIAGNOSIS — M1990 Unspecified osteoarthritis, unspecified site: Secondary | ICD-10-CM | POA: Diagnosis not present

## 2022-08-14 DIAGNOSIS — I209 Angina pectoris, unspecified: Secondary | ICD-10-CM | POA: Diagnosis not present

## 2022-08-14 DIAGNOSIS — S299XXA Unspecified injury of thorax, initial encounter: Secondary | ICD-10-CM | POA: Diagnosis not present

## 2022-08-14 DIAGNOSIS — M6281 Muscle weakness (generalized): Secondary | ICD-10-CM | POA: Diagnosis not present

## 2022-08-14 DIAGNOSIS — S79911A Unspecified injury of right hip, initial encounter: Secondary | ICD-10-CM | POA: Diagnosis not present

## 2022-08-14 DIAGNOSIS — S82251A Displaced comminuted fracture of shaft of right tibia, initial encounter for closed fracture: Secondary | ICD-10-CM | POA: Diagnosis not present

## 2022-08-14 DIAGNOSIS — S199XXA Unspecified injury of neck, initial encounter: Secondary | ICD-10-CM | POA: Diagnosis not present

## 2022-08-14 DIAGNOSIS — R262 Difficulty in walking, not elsewhere classified: Secondary | ICD-10-CM | POA: Diagnosis not present

## 2022-08-14 DIAGNOSIS — S82301D Unspecified fracture of lower end of right tibia, subsequent encounter for closed fracture with routine healing: Secondary | ICD-10-CM | POA: Diagnosis not present

## 2022-08-14 DIAGNOSIS — E119 Type 2 diabetes mellitus without complications: Secondary | ICD-10-CM | POA: Diagnosis present

## 2022-08-14 DIAGNOSIS — I10 Essential (primary) hypertension: Secondary | ICD-10-CM | POA: Diagnosis not present

## 2022-08-14 DIAGNOSIS — I4891 Unspecified atrial fibrillation: Secondary | ICD-10-CM | POA: Diagnosis not present

## 2022-08-14 DIAGNOSIS — Z01810 Encounter for preprocedural cardiovascular examination: Secondary | ICD-10-CM | POA: Diagnosis not present

## 2022-08-14 DIAGNOSIS — S82254A Nondisplaced comminuted fracture of shaft of right tibia, initial encounter for closed fracture: Secondary | ICD-10-CM | POA: Diagnosis not present

## 2022-08-14 DIAGNOSIS — Z4889 Encounter for other specified surgical aftercare: Secondary | ICD-10-CM | POA: Diagnosis not present

## 2022-08-14 DIAGNOSIS — S82101A Unspecified fracture of upper end of right tibia, initial encounter for closed fracture: Secondary | ICD-10-CM | POA: Diagnosis not present

## 2022-08-14 DIAGNOSIS — R1312 Dysphagia, oropharyngeal phase: Secondary | ICD-10-CM | POA: Diagnosis not present

## 2022-08-14 DIAGNOSIS — S82191A Other fracture of upper end of right tibia, initial encounter for closed fracture: Secondary | ICD-10-CM | POA: Diagnosis not present

## 2022-08-14 DIAGNOSIS — E039 Hypothyroidism, unspecified: Secondary | ICD-10-CM | POA: Diagnosis present

## 2022-08-14 DIAGNOSIS — E785 Hyperlipidemia, unspecified: Secondary | ICD-10-CM | POA: Diagnosis not present

## 2022-08-14 DIAGNOSIS — I5032 Chronic diastolic (congestive) heart failure: Secondary | ICD-10-CM | POA: Diagnosis not present

## 2022-08-14 DIAGNOSIS — S82401A Unspecified fracture of shaft of right fibula, initial encounter for closed fracture: Secondary | ICD-10-CM | POA: Diagnosis not present

## 2022-08-14 DIAGNOSIS — W010XXA Fall on same level from slipping, tripping and stumbling without subsequent striking against object, initial encounter: Secondary | ICD-10-CM | POA: Diagnosis not present

## 2022-08-18 DIAGNOSIS — I509 Heart failure, unspecified: Secondary | ICD-10-CM | POA: Diagnosis not present

## 2022-08-18 DIAGNOSIS — R058 Other specified cough: Secondary | ICD-10-CM | POA: Diagnosis not present

## 2022-08-18 DIAGNOSIS — I5031 Acute diastolic (congestive) heart failure: Secondary | ICD-10-CM | POA: Diagnosis not present

## 2022-08-18 DIAGNOSIS — Z23 Encounter for immunization: Secondary | ICD-10-CM | POA: Diagnosis not present

## 2022-08-18 DIAGNOSIS — E876 Hypokalemia: Secondary | ICD-10-CM | POA: Diagnosis not present

## 2022-08-18 DIAGNOSIS — E039 Hypothyroidism, unspecified: Secondary | ICD-10-CM | POA: Diagnosis not present

## 2022-08-18 DIAGNOSIS — G4700 Insomnia, unspecified: Secondary | ICD-10-CM | POA: Diagnosis not present

## 2022-08-18 DIAGNOSIS — B3749 Other urogenital candidiasis: Secondary | ICD-10-CM | POA: Diagnosis not present

## 2022-08-18 DIAGNOSIS — R262 Difficulty in walking, not elsewhere classified: Secondary | ICD-10-CM | POA: Diagnosis not present

## 2022-08-18 DIAGNOSIS — Z4789 Encounter for other orthopedic aftercare: Secondary | ICD-10-CM | POA: Diagnosis not present

## 2022-08-18 DIAGNOSIS — E785 Hyperlipidemia, unspecified: Secondary | ICD-10-CM | POA: Diagnosis not present

## 2022-08-18 DIAGNOSIS — C763 Malignant neoplasm of pelvis: Secondary | ICD-10-CM | POA: Diagnosis not present

## 2022-08-18 DIAGNOSIS — F419 Anxiety disorder, unspecified: Secondary | ICD-10-CM | POA: Diagnosis not present

## 2022-08-18 DIAGNOSIS — F5101 Primary insomnia: Secondary | ICD-10-CM | POA: Diagnosis not present

## 2022-08-18 DIAGNOSIS — I69921 Dysphasia following unspecified cerebrovascular disease: Secondary | ICD-10-CM | POA: Diagnosis not present

## 2022-08-18 DIAGNOSIS — I110 Hypertensive heart disease with heart failure: Secondary | ICD-10-CM | POA: Diagnosis not present

## 2022-08-18 DIAGNOSIS — F03918 Unspecified dementia, unspecified severity, with other behavioral disturbance: Secondary | ICD-10-CM | POA: Diagnosis not present

## 2022-08-18 DIAGNOSIS — R4182 Altered mental status, unspecified: Secondary | ICD-10-CM | POA: Diagnosis not present

## 2022-08-18 DIAGNOSIS — R079 Chest pain, unspecified: Secondary | ICD-10-CM | POA: Diagnosis not present

## 2022-08-18 DIAGNOSIS — R1312 Dysphagia, oropharyngeal phase: Secondary | ICD-10-CM | POA: Diagnosis not present

## 2022-08-18 DIAGNOSIS — R300 Dysuria: Secondary | ICD-10-CM | POA: Diagnosis not present

## 2022-08-18 DIAGNOSIS — S82301D Unspecified fracture of lower end of right tibia, subsequent encounter for closed fracture with routine healing: Secondary | ICD-10-CM | POA: Diagnosis not present

## 2022-08-18 DIAGNOSIS — R609 Edema, unspecified: Secondary | ICD-10-CM | POA: Diagnosis not present

## 2022-08-18 DIAGNOSIS — R279 Unspecified lack of coordination: Secondary | ICD-10-CM | POA: Diagnosis not present

## 2022-08-18 DIAGNOSIS — I959 Hypotension, unspecified: Secondary | ICD-10-CM | POA: Diagnosis not present

## 2022-08-18 DIAGNOSIS — R4689 Other symptoms and signs involving appearance and behavior: Secondary | ICD-10-CM | POA: Diagnosis not present

## 2022-08-18 DIAGNOSIS — R8290 Unspecified abnormal findings in urine: Secondary | ICD-10-CM | POA: Diagnosis not present

## 2022-08-18 DIAGNOSIS — F411 Generalized anxiety disorder: Secondary | ICD-10-CM | POA: Diagnosis not present

## 2022-08-18 DIAGNOSIS — F69 Unspecified disorder of adult personality and behavior: Secondary | ICD-10-CM | POA: Diagnosis not present

## 2022-08-18 DIAGNOSIS — Z9181 History of falling: Secondary | ICD-10-CM | POA: Diagnosis not present

## 2022-08-18 DIAGNOSIS — Z79899 Other long term (current) drug therapy: Secondary | ICD-10-CM | POA: Diagnosis not present

## 2022-08-18 DIAGNOSIS — S82201A Unspecified fracture of shaft of right tibia, initial encounter for closed fracture: Secondary | ICD-10-CM | POA: Diagnosis not present

## 2022-08-18 DIAGNOSIS — K5900 Constipation, unspecified: Secondary | ICD-10-CM | POA: Diagnosis not present

## 2022-08-18 DIAGNOSIS — I10 Essential (primary) hypertension: Secondary | ICD-10-CM | POA: Diagnosis not present

## 2022-08-18 DIAGNOSIS — N390 Urinary tract infection, site not specified: Secondary | ICD-10-CM | POA: Diagnosis not present

## 2022-08-18 DIAGNOSIS — R059 Cough, unspecified: Secondary | ICD-10-CM | POA: Diagnosis not present

## 2022-08-18 DIAGNOSIS — J90 Pleural effusion, not elsewhere classified: Secondary | ICD-10-CM | POA: Diagnosis not present

## 2022-08-18 DIAGNOSIS — R410 Disorientation, unspecified: Secondary | ICD-10-CM | POA: Diagnosis not present

## 2022-08-18 DIAGNOSIS — S82401D Unspecified fracture of shaft of right fibula, subsequent encounter for closed fracture with routine healing: Secondary | ICD-10-CM | POA: Diagnosis not present

## 2022-08-18 DIAGNOSIS — Z20822 Contact with and (suspected) exposure to covid-19: Secondary | ICD-10-CM | POA: Diagnosis not present

## 2022-08-18 DIAGNOSIS — S81812A Laceration without foreign body, left lower leg, initial encounter: Secondary | ICD-10-CM | POA: Diagnosis not present

## 2022-08-18 DIAGNOSIS — R739 Hyperglycemia, unspecified: Secondary | ICD-10-CM | POA: Diagnosis not present

## 2022-08-18 DIAGNOSIS — S82831A Other fracture of upper and lower end of right fibula, initial encounter for closed fracture: Secondary | ICD-10-CM | POA: Diagnosis not present

## 2022-08-18 DIAGNOSIS — Z1159 Encounter for screening for other viral diseases: Secondary | ICD-10-CM | POA: Diagnosis not present

## 2022-08-18 DIAGNOSIS — W010XXA Fall on same level from slipping, tripping and stumbling without subsequent striking against object, initial encounter: Secondary | ICD-10-CM | POA: Diagnosis not present

## 2022-08-18 DIAGNOSIS — Z1152 Encounter for screening for COVID-19: Secondary | ICD-10-CM | POA: Diagnosis not present

## 2022-08-18 DIAGNOSIS — R296 Repeated falls: Secondary | ICD-10-CM | POA: Diagnosis not present

## 2022-08-18 DIAGNOSIS — S82251A Displaced comminuted fracture of shaft of right tibia, initial encounter for closed fracture: Secondary | ICD-10-CM | POA: Diagnosis not present

## 2022-08-18 DIAGNOSIS — I4891 Unspecified atrial fibrillation: Secondary | ICD-10-CM | POA: Diagnosis not present

## 2022-08-18 DIAGNOSIS — I5032 Chronic diastolic (congestive) heart failure: Secondary | ICD-10-CM | POA: Diagnosis not present

## 2022-08-18 DIAGNOSIS — Z7401 Bed confinement status: Secondary | ICD-10-CM | POA: Diagnosis not present

## 2022-08-18 DIAGNOSIS — Z4889 Encounter for other specified surgical aftercare: Secondary | ICD-10-CM | POA: Diagnosis not present

## 2022-08-18 DIAGNOSIS — R0989 Other specified symptoms and signs involving the circulatory and respiratory systems: Secondary | ICD-10-CM | POA: Diagnosis not present

## 2022-08-18 DIAGNOSIS — R001 Bradycardia, unspecified: Secondary | ICD-10-CM | POA: Diagnosis not present

## 2022-08-18 DIAGNOSIS — M1990 Unspecified osteoarthritis, unspecified site: Secondary | ICD-10-CM | POA: Diagnosis not present

## 2022-08-18 DIAGNOSIS — F331 Major depressive disorder, recurrent, moderate: Secondary | ICD-10-CM | POA: Diagnosis not present

## 2022-08-18 DIAGNOSIS — R5383 Other fatigue: Secondary | ICD-10-CM | POA: Diagnosis not present

## 2022-08-18 DIAGNOSIS — M6281 Muscle weakness (generalized): Secondary | ICD-10-CM | POA: Diagnosis not present

## 2022-08-18 DIAGNOSIS — I209 Angina pectoris, unspecified: Secondary | ICD-10-CM | POA: Diagnosis not present

## 2022-08-18 DIAGNOSIS — E119 Type 2 diabetes mellitus without complications: Secondary | ICD-10-CM | POA: Diagnosis not present

## 2022-08-19 DIAGNOSIS — F419 Anxiety disorder, unspecified: Secondary | ICD-10-CM | POA: Diagnosis not present

## 2022-08-19 DIAGNOSIS — Z23 Encounter for immunization: Secondary | ICD-10-CM | POA: Diagnosis not present

## 2022-08-19 DIAGNOSIS — E119 Type 2 diabetes mellitus without complications: Secondary | ICD-10-CM | POA: Diagnosis not present

## 2022-08-19 DIAGNOSIS — S82401D Unspecified fracture of shaft of right fibula, subsequent encounter for closed fracture with routine healing: Secondary | ICD-10-CM | POA: Diagnosis not present

## 2022-08-19 DIAGNOSIS — R262 Difficulty in walking, not elsewhere classified: Secondary | ICD-10-CM | POA: Diagnosis not present

## 2022-08-19 DIAGNOSIS — I4891 Unspecified atrial fibrillation: Secondary | ICD-10-CM | POA: Diagnosis not present

## 2022-08-19 DIAGNOSIS — S82301D Unspecified fracture of lower end of right tibia, subsequent encounter for closed fracture with routine healing: Secondary | ICD-10-CM | POA: Diagnosis not present

## 2022-08-19 DIAGNOSIS — E039 Hypothyroidism, unspecified: Secondary | ICD-10-CM | POA: Diagnosis not present

## 2022-08-19 DIAGNOSIS — M6281 Muscle weakness (generalized): Secondary | ICD-10-CM | POA: Diagnosis not present

## 2022-08-19 DIAGNOSIS — R1312 Dysphagia, oropharyngeal phase: Secondary | ICD-10-CM | POA: Diagnosis not present

## 2022-08-20 DIAGNOSIS — R058 Other specified cough: Secondary | ICD-10-CM | POA: Diagnosis not present

## 2022-08-22 DIAGNOSIS — F419 Anxiety disorder, unspecified: Secondary | ICD-10-CM | POA: Diagnosis not present

## 2022-08-22 DIAGNOSIS — I4891 Unspecified atrial fibrillation: Secondary | ICD-10-CM | POA: Diagnosis not present

## 2022-08-22 DIAGNOSIS — E039 Hypothyroidism, unspecified: Secondary | ICD-10-CM | POA: Diagnosis not present

## 2022-08-22 DIAGNOSIS — E119 Type 2 diabetes mellitus without complications: Secondary | ICD-10-CM | POA: Diagnosis not present

## 2022-08-23 DIAGNOSIS — E119 Type 2 diabetes mellitus without complications: Secondary | ICD-10-CM | POA: Diagnosis not present

## 2022-08-23 DIAGNOSIS — I10 Essential (primary) hypertension: Secondary | ICD-10-CM | POA: Diagnosis not present

## 2022-08-23 DIAGNOSIS — E785 Hyperlipidemia, unspecified: Secondary | ICD-10-CM | POA: Diagnosis not present

## 2022-08-23 DIAGNOSIS — I4891 Unspecified atrial fibrillation: Secondary | ICD-10-CM | POA: Diagnosis not present

## 2022-08-24 DIAGNOSIS — K5900 Constipation, unspecified: Secondary | ICD-10-CM | POA: Diagnosis not present

## 2022-08-27 DIAGNOSIS — E119 Type 2 diabetes mellitus without complications: Secondary | ICD-10-CM | POA: Diagnosis not present

## 2022-08-28 DIAGNOSIS — I959 Hypotension, unspecified: Secondary | ICD-10-CM | POA: Diagnosis not present

## 2022-08-29 DIAGNOSIS — S82831A Other fracture of upper and lower end of right fibula, initial encounter for closed fracture: Secondary | ICD-10-CM | POA: Diagnosis not present

## 2022-08-30 DIAGNOSIS — R8290 Unspecified abnormal findings in urine: Secondary | ICD-10-CM | POA: Diagnosis not present

## 2022-08-30 DIAGNOSIS — E119 Type 2 diabetes mellitus without complications: Secondary | ICD-10-CM | POA: Diagnosis not present

## 2022-08-30 DIAGNOSIS — I4891 Unspecified atrial fibrillation: Secondary | ICD-10-CM | POA: Diagnosis not present

## 2022-08-30 DIAGNOSIS — G4700 Insomnia, unspecified: Secondary | ICD-10-CM | POA: Diagnosis not present

## 2022-08-30 DIAGNOSIS — E785 Hyperlipidemia, unspecified: Secondary | ICD-10-CM | POA: Diagnosis not present

## 2022-08-30 DIAGNOSIS — I10 Essential (primary) hypertension: Secondary | ICD-10-CM | POA: Diagnosis not present

## 2022-08-30 DIAGNOSIS — S82201A Unspecified fracture of shaft of right tibia, initial encounter for closed fracture: Secondary | ICD-10-CM | POA: Diagnosis not present

## 2022-09-01 DIAGNOSIS — N390 Urinary tract infection, site not specified: Secondary | ICD-10-CM | POA: Diagnosis not present

## 2022-09-02 DIAGNOSIS — N390 Urinary tract infection, site not specified: Secondary | ICD-10-CM | POA: Diagnosis not present

## 2022-09-03 DIAGNOSIS — I959 Hypotension, unspecified: Secondary | ICD-10-CM | POA: Diagnosis not present

## 2022-09-05 DIAGNOSIS — N390 Urinary tract infection, site not specified: Secondary | ICD-10-CM | POA: Diagnosis not present

## 2022-09-10 DIAGNOSIS — F69 Unspecified disorder of adult personality and behavior: Secondary | ICD-10-CM | POA: Diagnosis not present

## 2022-09-10 DIAGNOSIS — G4700 Insomnia, unspecified: Secondary | ICD-10-CM | POA: Diagnosis not present

## 2022-09-12 DIAGNOSIS — I959 Hypotension, unspecified: Secondary | ICD-10-CM | POA: Diagnosis not present

## 2022-09-15 DIAGNOSIS — F419 Anxiety disorder, unspecified: Secondary | ICD-10-CM | POA: Diagnosis not present

## 2022-09-16 DIAGNOSIS — N390 Urinary tract infection, site not specified: Secondary | ICD-10-CM | POA: Diagnosis not present

## 2022-09-16 DIAGNOSIS — E119 Type 2 diabetes mellitus without complications: Secondary | ICD-10-CM | POA: Diagnosis not present

## 2022-09-16 DIAGNOSIS — K5900 Constipation, unspecified: Secondary | ICD-10-CM | POA: Diagnosis not present

## 2022-09-16 DIAGNOSIS — S82401D Unspecified fracture of shaft of right fibula, subsequent encounter for closed fracture with routine healing: Secondary | ICD-10-CM | POA: Diagnosis not present

## 2022-09-16 DIAGNOSIS — E039 Hypothyroidism, unspecified: Secondary | ICD-10-CM | POA: Diagnosis not present

## 2022-09-16 DIAGNOSIS — S82301D Unspecified fracture of lower end of right tibia, subsequent encounter for closed fracture with routine healing: Secondary | ICD-10-CM | POA: Diagnosis not present

## 2022-09-16 DIAGNOSIS — I4891 Unspecified atrial fibrillation: Secondary | ICD-10-CM | POA: Diagnosis not present

## 2022-09-16 DIAGNOSIS — F419 Anxiety disorder, unspecified: Secondary | ICD-10-CM | POA: Diagnosis not present

## 2022-09-19 DIAGNOSIS — S82401D Unspecified fracture of shaft of right fibula, subsequent encounter for closed fracture with routine healing: Secondary | ICD-10-CM | POA: Diagnosis not present

## 2022-09-19 DIAGNOSIS — I4891 Unspecified atrial fibrillation: Secondary | ICD-10-CM | POA: Diagnosis not present

## 2022-09-19 DIAGNOSIS — G4700 Insomnia, unspecified: Secondary | ICD-10-CM | POA: Diagnosis not present

## 2022-09-19 DIAGNOSIS — F419 Anxiety disorder, unspecified: Secondary | ICD-10-CM | POA: Diagnosis not present

## 2022-09-19 DIAGNOSIS — E039 Hypothyroidism, unspecified: Secondary | ICD-10-CM | POA: Diagnosis not present

## 2022-09-19 DIAGNOSIS — E119 Type 2 diabetes mellitus without complications: Secondary | ICD-10-CM | POA: Diagnosis not present

## 2022-09-19 DIAGNOSIS — S82301D Unspecified fracture of lower end of right tibia, subsequent encounter for closed fracture with routine healing: Secondary | ICD-10-CM | POA: Diagnosis not present

## 2022-09-20 DIAGNOSIS — R410 Disorientation, unspecified: Secondary | ICD-10-CM | POA: Diagnosis not present

## 2022-09-21 DIAGNOSIS — S81812A Laceration without foreign body, left lower leg, initial encounter: Secondary | ICD-10-CM | POA: Diagnosis not present

## 2022-09-21 DIAGNOSIS — F69 Unspecified disorder of adult personality and behavior: Secondary | ICD-10-CM | POA: Diagnosis not present

## 2022-09-22 DIAGNOSIS — I10 Essential (primary) hypertension: Secondary | ICD-10-CM | POA: Diagnosis not present

## 2022-09-22 DIAGNOSIS — I4891 Unspecified atrial fibrillation: Secondary | ICD-10-CM | POA: Diagnosis not present

## 2022-09-22 DIAGNOSIS — E119 Type 2 diabetes mellitus without complications: Secondary | ICD-10-CM | POA: Diagnosis not present

## 2022-09-22 DIAGNOSIS — E785 Hyperlipidemia, unspecified: Secondary | ICD-10-CM | POA: Diagnosis not present

## 2022-09-25 DIAGNOSIS — F69 Unspecified disorder of adult personality and behavior: Secondary | ICD-10-CM | POA: Diagnosis not present

## 2022-09-25 DIAGNOSIS — R300 Dysuria: Secondary | ICD-10-CM | POA: Diagnosis not present

## 2022-09-29 DIAGNOSIS — I10 Essential (primary) hypertension: Secondary | ICD-10-CM | POA: Diagnosis not present

## 2022-09-29 DIAGNOSIS — I4891 Unspecified atrial fibrillation: Secondary | ICD-10-CM | POA: Diagnosis not present

## 2022-09-29 DIAGNOSIS — E785 Hyperlipidemia, unspecified: Secondary | ICD-10-CM | POA: Diagnosis not present

## 2022-09-29 DIAGNOSIS — E119 Type 2 diabetes mellitus without complications: Secondary | ICD-10-CM | POA: Diagnosis not present

## 2022-10-06 DIAGNOSIS — E785 Hyperlipidemia, unspecified: Secondary | ICD-10-CM | POA: Diagnosis not present

## 2022-10-06 DIAGNOSIS — I4891 Unspecified atrial fibrillation: Secondary | ICD-10-CM | POA: Diagnosis not present

## 2022-10-06 DIAGNOSIS — S82201A Unspecified fracture of shaft of right tibia, initial encounter for closed fracture: Secondary | ICD-10-CM | POA: Diagnosis not present

## 2022-10-06 DIAGNOSIS — I10 Essential (primary) hypertension: Secondary | ICD-10-CM | POA: Diagnosis not present

## 2022-10-06 DIAGNOSIS — E119 Type 2 diabetes mellitus without complications: Secondary | ICD-10-CM | POA: Diagnosis not present

## 2022-10-08 DIAGNOSIS — R296 Repeated falls: Secondary | ICD-10-CM | POA: Diagnosis not present

## 2022-10-08 DIAGNOSIS — K5900 Constipation, unspecified: Secondary | ICD-10-CM | POA: Diagnosis not present

## 2022-10-10 DIAGNOSIS — F419 Anxiety disorder, unspecified: Secondary | ICD-10-CM | POA: Diagnosis not present

## 2022-10-10 DIAGNOSIS — E119 Type 2 diabetes mellitus without complications: Secondary | ICD-10-CM | POA: Diagnosis not present

## 2022-10-10 DIAGNOSIS — I4891 Unspecified atrial fibrillation: Secondary | ICD-10-CM | POA: Diagnosis not present

## 2022-10-10 DIAGNOSIS — E039 Hypothyroidism, unspecified: Secondary | ICD-10-CM | POA: Diagnosis not present

## 2022-10-11 DIAGNOSIS — F69 Unspecified disorder of adult personality and behavior: Secondary | ICD-10-CM | POA: Diagnosis not present

## 2022-10-12 DIAGNOSIS — B3749 Other urogenital candidiasis: Secondary | ICD-10-CM | POA: Diagnosis not present

## 2022-10-13 DIAGNOSIS — B3749 Other urogenital candidiasis: Secondary | ICD-10-CM | POA: Diagnosis not present

## 2022-10-13 DIAGNOSIS — F419 Anxiety disorder, unspecified: Secondary | ICD-10-CM | POA: Diagnosis not present

## 2022-10-14 DIAGNOSIS — F03918 Unspecified dementia, unspecified severity, with other behavioral disturbance: Secondary | ICD-10-CM | POA: Diagnosis not present

## 2022-10-14 DIAGNOSIS — F331 Major depressive disorder, recurrent, moderate: Secondary | ICD-10-CM | POA: Diagnosis not present

## 2022-10-14 DIAGNOSIS — F419 Anxiety disorder, unspecified: Secondary | ICD-10-CM | POA: Diagnosis not present

## 2022-10-14 DIAGNOSIS — F411 Generalized anxiety disorder: Secondary | ICD-10-CM | POA: Diagnosis not present

## 2022-10-14 DIAGNOSIS — F5101 Primary insomnia: Secondary | ICD-10-CM | POA: Diagnosis not present

## 2022-10-15 DIAGNOSIS — F69 Unspecified disorder of adult personality and behavior: Secondary | ICD-10-CM | POA: Diagnosis not present

## 2022-10-15 DIAGNOSIS — F419 Anxiety disorder, unspecified: Secondary | ICD-10-CM | POA: Diagnosis not present

## 2022-10-16 DIAGNOSIS — R0989 Other specified symptoms and signs involving the circulatory and respiratory systems: Secondary | ICD-10-CM | POA: Diagnosis not present

## 2022-10-16 DIAGNOSIS — R059 Cough, unspecified: Secondary | ICD-10-CM | POA: Diagnosis not present

## 2022-10-17 DIAGNOSIS — F69 Unspecified disorder of adult personality and behavior: Secondary | ICD-10-CM | POA: Diagnosis not present

## 2022-10-17 DIAGNOSIS — F419 Anxiety disorder, unspecified: Secondary | ICD-10-CM | POA: Diagnosis not present

## 2022-10-18 DIAGNOSIS — Z20822 Contact with and (suspected) exposure to covid-19: Secondary | ICD-10-CM | POA: Diagnosis not present

## 2022-10-18 DIAGNOSIS — I110 Hypertensive heart disease with heart failure: Secondary | ICD-10-CM | POA: Diagnosis not present

## 2022-10-18 DIAGNOSIS — R4689 Other symptoms and signs involving appearance and behavior: Secondary | ICD-10-CM | POA: Diagnosis not present

## 2022-10-18 DIAGNOSIS — Z7401 Bed confinement status: Secondary | ICD-10-CM | POA: Diagnosis not present

## 2022-10-18 DIAGNOSIS — R609 Edema, unspecified: Secondary | ICD-10-CM | POA: Diagnosis not present

## 2022-10-18 DIAGNOSIS — J90 Pleural effusion, not elsewhere classified: Secondary | ICD-10-CM | POA: Diagnosis not present

## 2022-10-18 DIAGNOSIS — I5031 Acute diastolic (congestive) heart failure: Secondary | ICD-10-CM | POA: Diagnosis not present

## 2022-10-18 DIAGNOSIS — Z79899 Other long term (current) drug therapy: Secondary | ICD-10-CM | POA: Diagnosis not present

## 2022-10-18 DIAGNOSIS — R079 Chest pain, unspecified: Secondary | ICD-10-CM | POA: Diagnosis not present

## 2022-10-18 DIAGNOSIS — R001 Bradycardia, unspecified: Secondary | ICD-10-CM | POA: Diagnosis not present

## 2022-10-18 DIAGNOSIS — R739 Hyperglycemia, unspecified: Secondary | ICD-10-CM | POA: Diagnosis not present

## 2022-10-18 DIAGNOSIS — R279 Unspecified lack of coordination: Secondary | ICD-10-CM | POA: Diagnosis not present

## 2022-10-18 DIAGNOSIS — R4182 Altered mental status, unspecified: Secondary | ICD-10-CM | POA: Diagnosis not present

## 2022-10-18 DIAGNOSIS — F69 Unspecified disorder of adult personality and behavior: Secondary | ICD-10-CM | POA: Diagnosis not present

## 2022-10-18 DIAGNOSIS — F419 Anxiety disorder, unspecified: Secondary | ICD-10-CM | POA: Diagnosis not present

## 2022-10-18 DIAGNOSIS — Z1152 Encounter for screening for COVID-19: Secondary | ICD-10-CM | POA: Diagnosis not present

## 2022-10-19 DIAGNOSIS — I509 Heart failure, unspecified: Secondary | ICD-10-CM | POA: Diagnosis not present

## 2022-10-19 DIAGNOSIS — E876 Hypokalemia: Secondary | ICD-10-CM | POA: Diagnosis not present

## 2022-10-26 DIAGNOSIS — R5383 Other fatigue: Secondary | ICD-10-CM | POA: Diagnosis not present

## 2022-10-26 DIAGNOSIS — R8290 Unspecified abnormal findings in urine: Secondary | ICD-10-CM | POA: Diagnosis not present

## 2022-10-28 DIAGNOSIS — E119 Type 2 diabetes mellitus without complications: Secondary | ICD-10-CM | POA: Diagnosis not present

## 2022-10-28 DIAGNOSIS — I4891 Unspecified atrial fibrillation: Secondary | ICD-10-CM | POA: Diagnosis not present

## 2022-10-28 DIAGNOSIS — S82401D Unspecified fracture of shaft of right fibula, subsequent encounter for closed fracture with routine healing: Secondary | ICD-10-CM | POA: Diagnosis not present

## 2022-10-28 DIAGNOSIS — E039 Hypothyroidism, unspecified: Secondary | ICD-10-CM | POA: Diagnosis not present

## 2022-10-28 DIAGNOSIS — F5101 Primary insomnia: Secondary | ICD-10-CM | POA: Diagnosis not present

## 2022-10-28 DIAGNOSIS — I509 Heart failure, unspecified: Secondary | ICD-10-CM | POA: Diagnosis not present

## 2022-10-28 DIAGNOSIS — F411 Generalized anxiety disorder: Secondary | ICD-10-CM | POA: Diagnosis not present

## 2022-10-28 DIAGNOSIS — G4700 Insomnia, unspecified: Secondary | ICD-10-CM | POA: Diagnosis not present

## 2022-10-28 DIAGNOSIS — F03918 Unspecified dementia, unspecified severity, with other behavioral disturbance: Secondary | ICD-10-CM | POA: Diagnosis not present

## 2022-10-28 DIAGNOSIS — F331 Major depressive disorder, recurrent, moderate: Secondary | ICD-10-CM | POA: Diagnosis not present

## 2022-10-28 DIAGNOSIS — S82301D Unspecified fracture of lower end of right tibia, subsequent encounter for closed fracture with routine healing: Secondary | ICD-10-CM | POA: Diagnosis not present

## 2022-10-28 DIAGNOSIS — R296 Repeated falls: Secondary | ICD-10-CM | POA: Diagnosis not present

## 2022-11-03 DIAGNOSIS — I5033 Acute on chronic diastolic (congestive) heart failure: Secondary | ICD-10-CM | POA: Diagnosis present

## 2022-11-03 DIAGNOSIS — S82191A Other fracture of upper end of right tibia, initial encounter for closed fracture: Secondary | ICD-10-CM | POA: Diagnosis not present

## 2022-11-03 DIAGNOSIS — Z01810 Encounter for preprocedural cardiovascular examination: Secondary | ICD-10-CM | POA: Diagnosis not present

## 2022-11-03 DIAGNOSIS — F419 Anxiety disorder, unspecified: Secondary | ICD-10-CM | POA: Diagnosis present

## 2022-11-03 DIAGNOSIS — I5031 Acute diastolic (congestive) heart failure: Secondary | ICD-10-CM | POA: Diagnosis not present

## 2022-11-03 DIAGNOSIS — Z79890 Hormone replacement therapy (postmenopausal): Secondary | ICD-10-CM | POA: Diagnosis not present

## 2022-11-03 DIAGNOSIS — R2240 Localized swelling, mass and lump, unspecified lower limb: Secondary | ICD-10-CM | POA: Diagnosis not present

## 2022-11-03 DIAGNOSIS — Z85038 Personal history of other malignant neoplasm of large intestine: Secondary | ICD-10-CM | POA: Diagnosis not present

## 2022-11-03 DIAGNOSIS — J810 Acute pulmonary edema: Secondary | ICD-10-CM | POA: Diagnosis present

## 2022-11-03 DIAGNOSIS — I50813 Acute on chronic right heart failure: Secondary | ICD-10-CM | POA: Diagnosis not present

## 2022-11-03 DIAGNOSIS — E119 Type 2 diabetes mellitus without complications: Secondary | ICD-10-CM | POA: Diagnosis present

## 2022-11-03 DIAGNOSIS — Z794 Long term (current) use of insulin: Secondary | ICD-10-CM | POA: Diagnosis not present

## 2022-11-03 DIAGNOSIS — Z923 Personal history of irradiation: Secondary | ICD-10-CM | POA: Diagnosis not present

## 2022-11-03 DIAGNOSIS — I3139 Other pericardial effusion (noninflammatory): Secondary | ICD-10-CM | POA: Diagnosis present

## 2022-11-03 DIAGNOSIS — S82831D Other fracture of upper and lower end of right fibula, subsequent encounter for closed fracture with routine healing: Secondary | ICD-10-CM | POA: Diagnosis not present

## 2022-11-03 DIAGNOSIS — I482 Chronic atrial fibrillation, unspecified: Secondary | ICD-10-CM | POA: Diagnosis present

## 2022-11-03 DIAGNOSIS — R0789 Other chest pain: Secondary | ICD-10-CM | POA: Diagnosis present

## 2022-11-03 DIAGNOSIS — I509 Heart failure, unspecified: Secondary | ICD-10-CM | POA: Diagnosis not present

## 2022-11-03 DIAGNOSIS — Z9221 Personal history of antineoplastic chemotherapy: Secondary | ICD-10-CM | POA: Diagnosis not present

## 2022-11-03 DIAGNOSIS — J918 Pleural effusion in other conditions classified elsewhere: Secondary | ICD-10-CM | POA: Diagnosis not present

## 2022-11-03 DIAGNOSIS — I110 Hypertensive heart disease with heart failure: Secondary | ICD-10-CM | POA: Diagnosis present

## 2022-11-03 DIAGNOSIS — Z8521 Personal history of malignant neoplasm of larynx: Secondary | ICD-10-CM | POA: Diagnosis not present

## 2022-11-03 DIAGNOSIS — I4891 Unspecified atrial fibrillation: Secondary | ICD-10-CM | POA: Diagnosis not present

## 2022-11-03 DIAGNOSIS — J90 Pleural effusion, not elsewhere classified: Secondary | ICD-10-CM | POA: Diagnosis not present

## 2022-11-03 DIAGNOSIS — Z7982 Long term (current) use of aspirin: Secondary | ICD-10-CM | POA: Diagnosis not present

## 2022-11-03 DIAGNOSIS — Z885 Allergy status to narcotic agent status: Secondary | ICD-10-CM | POA: Diagnosis not present

## 2022-11-03 DIAGNOSIS — Z0289 Encounter for other administrative examinations: Secondary | ICD-10-CM | POA: Diagnosis not present

## 2022-11-03 DIAGNOSIS — R079 Chest pain, unspecified: Secondary | ICD-10-CM | POA: Diagnosis not present

## 2022-11-05 DIAGNOSIS — R2240 Localized swelling, mass and lump, unspecified lower limb: Secondary | ICD-10-CM | POA: Diagnosis not present

## 2022-11-05 DIAGNOSIS — I509 Heart failure, unspecified: Secondary | ICD-10-CM | POA: Diagnosis not present

## 2022-11-05 DIAGNOSIS — I5031 Acute diastolic (congestive) heart failure: Secondary | ICD-10-CM | POA: Diagnosis not present

## 2022-11-05 DIAGNOSIS — J918 Pleural effusion in other conditions classified elsewhere: Secondary | ICD-10-CM | POA: Diagnosis not present

## 2022-11-05 DIAGNOSIS — S82191A Other fracture of upper end of right tibia, initial encounter for closed fracture: Secondary | ICD-10-CM | POA: Diagnosis not present

## 2022-11-05 DIAGNOSIS — S82831D Other fracture of upper and lower end of right fibula, subsequent encounter for closed fracture with routine healing: Secondary | ICD-10-CM | POA: Diagnosis not present

## 2022-11-05 DIAGNOSIS — R079 Chest pain, unspecified: Secondary | ICD-10-CM | POA: Diagnosis not present

## 2022-11-06 DIAGNOSIS — I5031 Acute diastolic (congestive) heart failure: Secondary | ICD-10-CM | POA: Diagnosis not present

## 2022-11-06 DIAGNOSIS — J90 Pleural effusion, not elsewhere classified: Secondary | ICD-10-CM | POA: Diagnosis not present

## 2022-11-06 DIAGNOSIS — I509 Heart failure, unspecified: Secondary | ICD-10-CM | POA: Diagnosis not present

## 2022-11-06 DIAGNOSIS — Z923 Personal history of irradiation: Secondary | ICD-10-CM | POA: Diagnosis not present

## 2022-11-06 DIAGNOSIS — E119 Type 2 diabetes mellitus without complications: Secondary | ICD-10-CM | POA: Diagnosis present

## 2022-11-06 DIAGNOSIS — I4891 Unspecified atrial fibrillation: Secondary | ICD-10-CM | POA: Diagnosis not present

## 2022-11-06 DIAGNOSIS — I110 Hypertensive heart disease with heart failure: Secondary | ICD-10-CM | POA: Diagnosis present

## 2022-11-06 DIAGNOSIS — Z8521 Personal history of malignant neoplasm of larynx: Secondary | ICD-10-CM | POA: Diagnosis not present

## 2022-11-06 DIAGNOSIS — I50813 Acute on chronic right heart failure: Secondary | ICD-10-CM | POA: Diagnosis not present

## 2022-11-06 DIAGNOSIS — F419 Anxiety disorder, unspecified: Secondary | ICD-10-CM | POA: Diagnosis present

## 2022-11-06 DIAGNOSIS — Z79890 Hormone replacement therapy (postmenopausal): Secondary | ICD-10-CM | POA: Diagnosis not present

## 2022-11-06 DIAGNOSIS — Z9221 Personal history of antineoplastic chemotherapy: Secondary | ICD-10-CM | POA: Diagnosis not present

## 2022-11-06 DIAGNOSIS — Z01810 Encounter for preprocedural cardiovascular examination: Secondary | ICD-10-CM | POA: Diagnosis not present

## 2022-11-06 DIAGNOSIS — I3139 Other pericardial effusion (noninflammatory): Secondary | ICD-10-CM | POA: Diagnosis present

## 2022-11-06 DIAGNOSIS — Z85038 Personal history of other malignant neoplasm of large intestine: Secondary | ICD-10-CM | POA: Diagnosis not present

## 2022-11-06 DIAGNOSIS — I482 Chronic atrial fibrillation, unspecified: Secondary | ICD-10-CM | POA: Diagnosis present

## 2022-11-06 DIAGNOSIS — R0789 Other chest pain: Secondary | ICD-10-CM | POA: Diagnosis present

## 2022-11-06 DIAGNOSIS — Z7982 Long term (current) use of aspirin: Secondary | ICD-10-CM | POA: Diagnosis not present

## 2022-11-06 DIAGNOSIS — I5033 Acute on chronic diastolic (congestive) heart failure: Secondary | ICD-10-CM | POA: Diagnosis present

## 2022-11-06 DIAGNOSIS — Z885 Allergy status to narcotic agent status: Secondary | ICD-10-CM | POA: Diagnosis not present

## 2022-11-06 DIAGNOSIS — J810 Acute pulmonary edema: Secondary | ICD-10-CM | POA: Diagnosis present

## 2022-11-06 DIAGNOSIS — Z794 Long term (current) use of insulin: Secondary | ICD-10-CM | POA: Diagnosis not present

## 2022-11-07 DIAGNOSIS — I5031 Acute diastolic (congestive) heart failure: Secondary | ICD-10-CM | POA: Diagnosis not present

## 2022-11-07 DIAGNOSIS — I4891 Unspecified atrial fibrillation: Secondary | ICD-10-CM | POA: Diagnosis not present

## 2022-11-07 DIAGNOSIS — J90 Pleural effusion, not elsewhere classified: Secondary | ICD-10-CM | POA: Diagnosis not present

## 2022-11-07 DIAGNOSIS — R0789 Other chest pain: Secondary | ICD-10-CM | POA: Diagnosis not present

## 2022-11-07 DIAGNOSIS — I50813 Acute on chronic right heart failure: Secondary | ICD-10-CM | POA: Diagnosis not present

## 2022-11-08 DIAGNOSIS — R0789 Other chest pain: Secondary | ICD-10-CM | POA: Diagnosis not present

## 2022-11-08 DIAGNOSIS — J90 Pleural effusion, not elsewhere classified: Secondary | ICD-10-CM | POA: Diagnosis not present

## 2022-11-08 DIAGNOSIS — I5031 Acute diastolic (congestive) heart failure: Secondary | ICD-10-CM | POA: Diagnosis not present

## 2022-11-08 DIAGNOSIS — I4891 Unspecified atrial fibrillation: Secondary | ICD-10-CM | POA: Diagnosis not present

## 2022-11-08 DIAGNOSIS — I50813 Acute on chronic right heart failure: Secondary | ICD-10-CM | POA: Diagnosis not present

## 2022-11-10 DIAGNOSIS — I10 Essential (primary) hypertension: Secondary | ICD-10-CM | POA: Diagnosis not present

## 2022-11-10 DIAGNOSIS — N182 Chronic kidney disease, stage 2 (mild): Secondary | ICD-10-CM | POA: Diagnosis not present

## 2022-11-10 DIAGNOSIS — F408 Other phobic anxiety disorders: Secondary | ICD-10-CM | POA: Diagnosis not present

## 2022-11-10 DIAGNOSIS — E1143 Type 2 diabetes mellitus with diabetic autonomic (poly)neuropathy: Secondary | ICD-10-CM | POA: Diagnosis not present

## 2022-11-10 DIAGNOSIS — I5032 Chronic diastolic (congestive) heart failure: Secondary | ICD-10-CM | POA: Diagnosis not present

## 2022-11-10 DIAGNOSIS — D692 Other nonthrombocytopenic purpura: Secondary | ICD-10-CM | POA: Diagnosis not present

## 2022-11-10 DIAGNOSIS — E038 Other specified hypothyroidism: Secondary | ICD-10-CM | POA: Diagnosis not present

## 2022-11-10 DIAGNOSIS — K219 Gastro-esophageal reflux disease without esophagitis: Secondary | ICD-10-CM | POA: Diagnosis not present

## 2022-11-10 DIAGNOSIS — E1121 Type 2 diabetes mellitus with diabetic nephropathy: Secondary | ICD-10-CM | POA: Diagnosis not present

## 2022-11-21 DIAGNOSIS — M25552 Pain in left hip: Secondary | ICD-10-CM | POA: Diagnosis not present

## 2022-11-21 DIAGNOSIS — M79605 Pain in left leg: Secondary | ICD-10-CM | POA: Diagnosis not present

## 2022-11-21 DIAGNOSIS — M25551 Pain in right hip: Secondary | ICD-10-CM | POA: Diagnosis not present

## 2022-11-21 DIAGNOSIS — M79662 Pain in left lower leg: Secondary | ICD-10-CM | POA: Diagnosis not present

## 2022-11-21 DIAGNOSIS — Z885 Allergy status to narcotic agent status: Secondary | ICD-10-CM | POA: Diagnosis not present

## 2023-01-24 DIAGNOSIS — D1801 Hemangioma of skin and subcutaneous tissue: Secondary | ICD-10-CM | POA: Diagnosis not present

## 2023-01-24 DIAGNOSIS — L820 Inflamed seborrheic keratosis: Secondary | ICD-10-CM | POA: Diagnosis not present

## 2023-01-24 DIAGNOSIS — L259 Unspecified contact dermatitis, unspecified cause: Secondary | ICD-10-CM | POA: Diagnosis not present

## 2023-01-24 DIAGNOSIS — I872 Venous insufficiency (chronic) (peripheral): Secondary | ICD-10-CM | POA: Diagnosis not present

## 2023-01-24 DIAGNOSIS — L821 Other seborrheic keratosis: Secondary | ICD-10-CM | POA: Diagnosis not present

## 2023-02-09 DIAGNOSIS — F411 Generalized anxiety disorder: Secondary | ICD-10-CM | POA: Diagnosis not present

## 2023-02-09 DIAGNOSIS — D692 Other nonthrombocytopenic purpura: Secondary | ICD-10-CM | POA: Diagnosis not present

## 2023-02-09 DIAGNOSIS — E038 Other specified hypothyroidism: Secondary | ICD-10-CM | POA: Diagnosis not present

## 2023-02-09 DIAGNOSIS — I5032 Chronic diastolic (congestive) heart failure: Secondary | ICD-10-CM | POA: Diagnosis not present

## 2023-02-09 DIAGNOSIS — K219 Gastro-esophageal reflux disease without esophagitis: Secondary | ICD-10-CM | POA: Diagnosis not present

## 2023-02-09 DIAGNOSIS — E1122 Type 2 diabetes mellitus with diabetic chronic kidney disease: Secondary | ICD-10-CM | POA: Diagnosis not present

## 2023-02-09 DIAGNOSIS — I10 Essential (primary) hypertension: Secondary | ICD-10-CM | POA: Diagnosis not present

## 2023-02-09 DIAGNOSIS — N182 Chronic kidney disease, stage 2 (mild): Secondary | ICD-10-CM | POA: Diagnosis not present

## 2023-03-09 DIAGNOSIS — H52223 Regular astigmatism, bilateral: Secondary | ICD-10-CM | POA: Diagnosis not present

## 2023-03-09 DIAGNOSIS — H43813 Vitreous degeneration, bilateral: Secondary | ICD-10-CM | POA: Diagnosis not present

## 2023-03-09 DIAGNOSIS — H5202 Hypermetropia, left eye: Secondary | ICD-10-CM | POA: Diagnosis not present

## 2023-03-09 DIAGNOSIS — H524 Presbyopia: Secondary | ICD-10-CM | POA: Diagnosis not present

## 2023-03-09 DIAGNOSIS — Z9849 Cataract extraction status, unspecified eye: Secondary | ICD-10-CM | POA: Diagnosis not present

## 2023-03-09 DIAGNOSIS — Z961 Presence of intraocular lens: Secondary | ICD-10-CM | POA: Diagnosis not present

## 2023-05-11 DIAGNOSIS — E038 Other specified hypothyroidism: Secondary | ICD-10-CM | POA: Diagnosis not present

## 2023-05-11 DIAGNOSIS — I10 Essential (primary) hypertension: Secondary | ICD-10-CM | POA: Diagnosis not present

## 2023-05-11 DIAGNOSIS — D692 Other nonthrombocytopenic purpura: Secondary | ICD-10-CM | POA: Diagnosis not present

## 2023-05-11 DIAGNOSIS — N182 Chronic kidney disease, stage 2 (mild): Secondary | ICD-10-CM | POA: Diagnosis not present

## 2023-05-11 DIAGNOSIS — Z0000 Encounter for general adult medical examination without abnormal findings: Secondary | ICD-10-CM | POA: Diagnosis not present

## 2023-05-11 DIAGNOSIS — Z1331 Encounter for screening for depression: Secondary | ICD-10-CM | POA: Diagnosis not present

## 2023-05-11 DIAGNOSIS — K219 Gastro-esophageal reflux disease without esophagitis: Secondary | ICD-10-CM | POA: Diagnosis not present

## 2023-05-11 DIAGNOSIS — F411 Generalized anxiety disorder: Secondary | ICD-10-CM | POA: Diagnosis not present

## 2023-05-11 DIAGNOSIS — E1122 Type 2 diabetes mellitus with diabetic chronic kidney disease: Secondary | ICD-10-CM | POA: Diagnosis not present

## 2023-05-11 DIAGNOSIS — I5032 Chronic diastolic (congestive) heart failure: Secondary | ICD-10-CM | POA: Diagnosis not present

## 2023-07-19 DIAGNOSIS — M810 Age-related osteoporosis without current pathological fracture: Secondary | ICD-10-CM | POA: Diagnosis not present

## 2023-08-17 DIAGNOSIS — F411 Generalized anxiety disorder: Secondary | ICD-10-CM | POA: Diagnosis not present

## 2023-08-17 DIAGNOSIS — D692 Other nonthrombocytopenic purpura: Secondary | ICD-10-CM | POA: Diagnosis not present

## 2023-08-17 DIAGNOSIS — I10 Essential (primary) hypertension: Secondary | ICD-10-CM | POA: Diagnosis not present

## 2023-08-17 DIAGNOSIS — K219 Gastro-esophageal reflux disease without esophagitis: Secondary | ICD-10-CM | POA: Diagnosis not present

## 2023-08-17 DIAGNOSIS — N182 Chronic kidney disease, stage 2 (mild): Secondary | ICD-10-CM | POA: Diagnosis not present

## 2023-08-17 DIAGNOSIS — I5032 Chronic diastolic (congestive) heart failure: Secondary | ICD-10-CM | POA: Diagnosis not present

## 2023-08-17 DIAGNOSIS — E1122 Type 2 diabetes mellitus with diabetic chronic kidney disease: Secondary | ICD-10-CM | POA: Diagnosis not present

## 2023-08-17 DIAGNOSIS — E038 Other specified hypothyroidism: Secondary | ICD-10-CM | POA: Diagnosis not present

## 2023-11-07 DIAGNOSIS — I10 Essential (primary) hypertension: Secondary | ICD-10-CM | POA: Diagnosis not present

## 2023-11-07 DIAGNOSIS — E1122 Type 2 diabetes mellitus with diabetic chronic kidney disease: Secondary | ICD-10-CM | POA: Diagnosis not present

## 2023-11-16 DIAGNOSIS — K219 Gastro-esophageal reflux disease without esophagitis: Secondary | ICD-10-CM | POA: Diagnosis not present

## 2023-11-16 DIAGNOSIS — E038 Other specified hypothyroidism: Secondary | ICD-10-CM | POA: Diagnosis not present

## 2023-11-16 DIAGNOSIS — I10 Essential (primary) hypertension: Secondary | ICD-10-CM | POA: Diagnosis not present

## 2023-11-16 DIAGNOSIS — I5032 Chronic diastolic (congestive) heart failure: Secondary | ICD-10-CM | POA: Diagnosis not present

## 2023-11-16 DIAGNOSIS — D692 Other nonthrombocytopenic purpura: Secondary | ICD-10-CM | POA: Diagnosis not present

## 2023-11-16 DIAGNOSIS — F411 Generalized anxiety disorder: Secondary | ICD-10-CM | POA: Diagnosis not present

## 2023-11-16 DIAGNOSIS — N182 Chronic kidney disease, stage 2 (mild): Secondary | ICD-10-CM | POA: Diagnosis not present

## 2023-11-16 DIAGNOSIS — E1122 Type 2 diabetes mellitus with diabetic chronic kidney disease: Secondary | ICD-10-CM | POA: Diagnosis not present

## 2023-12-01 DIAGNOSIS — Z23 Encounter for immunization: Secondary | ICD-10-CM | POA: Diagnosis not present

## 2023-12-20 DIAGNOSIS — K582 Mixed irritable bowel syndrome: Secondary | ICD-10-CM | POA: Diagnosis not present
